# Patient Record
Sex: Female | Born: 1970 | Race: White | Hispanic: No | Marital: Single | Smoking: Former smoker
Health system: Southern US, Community
[De-identification: ages and names within clinical notes are randomized; demographics above are authoritative.]

## PROBLEM LIST (undated history)

## (undated) ENCOUNTER — Ambulatory Visit: Payer: Commercial Managed Care - HMO

## (undated) DIAGNOSIS — A64 Unspecified sexually transmitted disease: Secondary | ICD-10-CM

## (undated) DIAGNOSIS — F32A Depression, unspecified: Secondary | ICD-10-CM

## (undated) DIAGNOSIS — T7840XA Allergy, unspecified, initial encounter: Secondary | ICD-10-CM

## (undated) DIAGNOSIS — G43909 Migraine, unspecified, not intractable, without status migrainosus: Secondary | ICD-10-CM

## (undated) DIAGNOSIS — E7800 Pure hypercholesterolemia, unspecified: Secondary | ICD-10-CM

## (undated) DIAGNOSIS — M1990 Unspecified osteoarthritis, unspecified site: Secondary | ICD-10-CM

## (undated) DIAGNOSIS — C801 Malignant (primary) neoplasm, unspecified: Secondary | ICD-10-CM

## (undated) DIAGNOSIS — F419 Anxiety disorder, unspecified: Secondary | ICD-10-CM

## (undated) DIAGNOSIS — R87619 Unspecified abnormal cytological findings in specimens from cervix uteri: Secondary | ICD-10-CM

## (undated) DIAGNOSIS — I5021 Acute systolic (congestive) heart failure: Secondary | ICD-10-CM

## (undated) DIAGNOSIS — I219 Acute myocardial infarction, unspecified: Secondary | ICD-10-CM

## (undated) DIAGNOSIS — C4491 Basal cell carcinoma of skin, unspecified: Secondary | ICD-10-CM

## (undated) HISTORY — PX: CERVICAL SPINE SURGERY: SHX589

## (undated) HISTORY — DX: Malignant (primary) neoplasm, unspecified: C80.1

## (undated) HISTORY — DX: Allergy, unspecified, initial encounter: T78.40XA

## (undated) HISTORY — DX: Unspecified abnormal cytological findings in specimens from cervix uteri: R87.619

## (undated) HISTORY — DX: Unspecified osteoarthritis, unspecified site: M19.90

## (undated) HISTORY — DX: Depression, unspecified: F32.A

## (undated) HISTORY — DX: Migraine, unspecified, not intractable, without status migrainosus: G43.909

## (undated) HISTORY — DX: Anxiety disorder, unspecified: F41.9

## (undated) HISTORY — PX: SPINE SURGERY: SHX786

## (undated) HISTORY — PX: CERVIX LESION DESTRUCTION: SHX591

## (undated) HISTORY — DX: Basal cell carcinoma of skin, unspecified: C44.91

## (undated) HISTORY — DX: Unspecified sexually transmitted disease: A64

## (undated) NOTE — Unmapped External Note (Signed)
 Formatting of this note is different from the original. Anesthesia Transfer of Care Note  Patient: Wanda Wood Procedure(s) performed:  FUSION CERVICAL ANTERIOR MICRODISCECTOMY 2 LEVELS -  ANTERIOR CERVICAL MICRODISECTOMY & FUSION C5-C6, C6-C7 W/ INSTRUMENTATION; AUTOGRAFT Patient location: PACU Patient transported: on O2  Pain score: 3/10 Post assessment: no apparent anesthetic complications Last vitals:  Filed Vitals:   11/07/10 1410  BP: 143/68  Pulse: 94  Temp: 98.3 F  Resp: 20  SpO2: 100%   Post vital signs: stable Mental status: awake, alert  and oriented Complications: none  Comments:TO PACU WITH VSS AND NAD NOTED ON 4L [place].  REPORT AND CARE TO RN.  Electronically signed by Evalene GORMAN Blush at 11/07/2010  2:12 PM EDT

## (undated) NOTE — Anesthesia Postprocedure Evaluation (Signed)
 Formatting of this note is different from the original. Anesthesia Post Note  Patient: Wanda Wood  Procedure(s) Performed:  FUSION CERVICAL ANTERIOR MICRODISCECTOMY 2 LEVELS -  ANTERIOR CERVICAL MICRODISECTOMY & FUSION C5-C6, C6-C7 W/ INSTRUMENTATION; AUTOGRAFT  Mental status: awake, alert  and oriented  Cardiopulmonary status: stable  PostOp pain: 8/10  Anesthetic complications: none  Last Vitals:  Filed Vitals:   11/07/10 1411  BP: 143/68  Pulse: 94  Temp:   Resp: 20  SpO2: 100%   Comments:   Electronically signed by Warren DELENA Hoyer, MD at 11/07/2010  2:21 PM EDT

## (undated) NOTE — Anesthesia Preprocedure Evaluation (Signed)
 Formatting of this note might be different from the original. Anesthesia ROS/Med History  The patient does not have a history of anesthetic complications.   Neuro/Psych  - seizures - CVA + Pain, location: neck  + psychiatric history, type: anxiety  Neuro/Psych comments: Narcotic Dependent since 02/2010 for L sided neck pain w/ L arm radiation. C/o constant numbness L thumb Recently using 4 tabs  Vicodin daily Pulmonary  Patient has a negative pulmonary ROS  - smoking history  Cardiovascular  Patient has a negative cardiovascular ROS  Hx occasional murmur:  GI/Hepatic/Renal  Patient has a negative GI/hepatic/renal ROS   GERD: Denies. Hematology  Patient has a negative hematology ROS  Daily home Omega 3 use -LD 11/03/10:  Endo/Cancer/Other  Patient has a neg endo/other ROS   Other Review of Systems findings: Needs to remove contac lenses pre-op  Anesthesia Physical Exam Airway  Mallampati: II Pulmonary  Normal systems: pulmonary exam normal breath sounds clear to auscultation Cardiovascular  Normal systems: cardiovascular exam normal  rhythm: regular  rate: normal   Anesthesia Plan ASA Score: 2   Anesthesia Plan of Care:  GETA and Glide Plan Monitor  Monitor: routine Postoperative Plan  Post Operative Plan: routine  Informed consent  Anesthetic Plan and risks discussed with the patient.  The use of blood products was discussed with and consented by the patient.  Consent given Discussed the anesthetic plan with the CRNA and Anesthesiologist.    Electronically signed by Barnie DELENA Begun, RN at 11/07/2010 10:20 AM EDT Electronically signed by Barnie DELENA Begun, RN at 11/07/2010 10:30 AM EDT Electronically signed by Warren DELENA Hoyer, MD at 11/07/2010 10:40 AM EDT Electronically signed by Warren DELENA Hoyer, MD at 11/07/2010 10:41 AM EDT Electronically signed by Warren DELENA Hoyer, MD at 11/07/2010 11:07 AM EDT Electronically signed by Delon LOISE Lowers, CRNA at  11/07/2010 11:20 AM EDT Electronically signed by Evalene GORMAN Blush at 11/07/2010 11:43 AM EDT

---

## 2016-03-05 ENCOUNTER — Ambulatory Visit: Payer: Self-pay | Admitting: Allergy and Immunology

## 2016-03-23 ENCOUNTER — Ambulatory Visit: Payer: Self-pay | Admitting: Allergy and Immunology

## 2016-10-05 DIAGNOSIS — R87619 Unspecified abnormal cytological findings in specimens from cervix uteri: Secondary | ICD-10-CM | POA: Insufficient documentation

## 2016-10-05 LAB — HM PAP SMEAR: HM Pap smear: NEGATIVE

## 2016-11-05 DIAGNOSIS — N870 Mild cervical dysplasia: Secondary | ICD-10-CM | POA: Insufficient documentation

## 2016-11-05 DIAGNOSIS — N760 Acute vaginitis: Secondary | ICD-10-CM | POA: Insufficient documentation

## 2019-02-13 ENCOUNTER — Encounter: Payer: Self-pay | Admitting: Internal Medicine

## 2019-03-13 ENCOUNTER — Ambulatory Visit: Payer: Self-pay | Admitting: Internal Medicine

## 2020-08-06 ENCOUNTER — Encounter: Payer: Self-pay | Admitting: Adult Health

## 2020-09-10 ENCOUNTER — Ambulatory Visit: Payer: Self-pay | Admitting: Adult Health

## 2020-10-17 LAB — RESULTS CONSOLE HPV: CHL HPV: POSITIVE

## 2020-10-17 LAB — HM PAP SMEAR: HM Pap smear: NEGATIVE

## 2020-11-05 ENCOUNTER — Ambulatory Visit: Payer: Self-pay | Admitting: Adult Health

## 2021-01-18 ENCOUNTER — Other Ambulatory Visit: Payer: Self-pay

## 2021-01-18 ENCOUNTER — Encounter: Payer: Self-pay | Admitting: *Deleted

## 2021-01-18 ENCOUNTER — Ambulatory Visit
Admission: EM | Admit: 2021-01-18 | Discharge: 2021-01-18 | Disposition: A | Discharge status: Nursing Home (Includes ICF) | Payer: Self-pay

## 2021-01-18 ENCOUNTER — Inpatient Hospital Stay (HOSPITAL_COMMUNITY)
Admission: EM | Admit: 2021-01-18 | Discharge: 2021-01-22 | DRG: 246 | Disposition: A | Discharge status: Home / Self Care | Payer: Self-pay | Attending: Cardiovascular Disease | Admitting: Cardiovascular Disease

## 2021-01-18 ENCOUNTER — Encounter (HOSPITAL_COMMUNITY): Payer: Self-pay | Admitting: Emergency Medicine

## 2021-01-18 ENCOUNTER — Emergency Department (HOSPITAL_COMMUNITY): Payer: Self-pay

## 2021-01-18 DIAGNOSIS — Z79890 Hormone replacement therapy (postmenopausal): Secondary | ICD-10-CM

## 2021-01-18 DIAGNOSIS — Z8249 Family history of ischemic heart disease and other diseases of the circulatory system: Secondary | ICD-10-CM

## 2021-01-18 DIAGNOSIS — I5021 Acute systolic (congestive) heart failure: Secondary | ICD-10-CM | POA: Diagnosis present

## 2021-01-18 DIAGNOSIS — E7801 Familial hypercholesterolemia: Secondary | ICD-10-CM

## 2021-01-18 DIAGNOSIS — I959 Hypotension, unspecified: Secondary | ICD-10-CM

## 2021-01-18 DIAGNOSIS — I214 Non-ST elevation (NSTEMI) myocardial infarction: Secondary | ICD-10-CM

## 2021-01-18 DIAGNOSIS — I255 Ischemic cardiomyopathy: Secondary | ICD-10-CM

## 2021-01-18 DIAGNOSIS — N951 Menopausal and female climacteric states: Secondary | ICD-10-CM | POA: Diagnosis present

## 2021-01-18 DIAGNOSIS — Z20822 Contact with and (suspected) exposure to covid-19: Secondary | ICD-10-CM | POA: Diagnosis present

## 2021-01-18 DIAGNOSIS — R079 Chest pain, unspecified: Secondary | ICD-10-CM

## 2021-01-18 DIAGNOSIS — E739 Lactose intolerance, unspecified: Secondary | ICD-10-CM | POA: Diagnosis present

## 2021-01-18 DIAGNOSIS — Z882 Allergy status to sulfonamides status: Secondary | ICD-10-CM

## 2021-01-18 DIAGNOSIS — Z955 Presence of coronary angioplasty implant and graft: Secondary | ICD-10-CM

## 2021-01-18 DIAGNOSIS — I2510 Atherosclerotic heart disease of native coronary artery without angina pectoris: Secondary | ICD-10-CM

## 2021-01-18 DIAGNOSIS — E785 Hyperlipidemia, unspecified: Secondary | ICD-10-CM

## 2021-01-18 DIAGNOSIS — Z79899 Other long term (current) drug therapy: Secondary | ICD-10-CM

## 2021-01-18 HISTORY — DX: Pure hypercholesterolemia, unspecified: E78.00

## 2021-01-18 LAB — CBC WITH DIFFERENTIAL/PLATELET
Abs Immature Granulocytes: 0.01 10*3/uL (ref 0.00–0.07)
Basophils Absolute: 0 10*3/uL (ref 0.0–0.1)
Basophils Relative: 1 %
Eosinophils Absolute: 0.1 10*3/uL (ref 0.0–0.5)
Eosinophils Relative: 1 %
HCT: 41.8 % (ref 36.0–46.0)
Hemoglobin: 13.7 g/dL (ref 12.0–15.0)
Immature Granulocytes: 0 %
Lymphocytes Relative: 30 %
Lymphs Abs: 2.2 10*3/uL (ref 0.7–4.0)
MCH: 29.6 pg (ref 26.0–34.0)
MCHC: 32.8 g/dL (ref 30.0–36.0)
MCV: 90.3 fL (ref 80.0–100.0)
Monocytes Absolute: 0.6 10*3/uL (ref 0.1–1.0)
Monocytes Relative: 8 %
Neutro Abs: 4.5 10*3/uL (ref 1.7–7.7)
Neutrophils Relative %: 60 %
Platelets: 266 10*3/uL (ref 150–400)
RBC: 4.63 MIL/uL (ref 3.87–5.11)
RDW: 12.5 % (ref 11.5–15.5)
WBC: 7.3 10*3/uL (ref 4.0–10.5)
nRBC: 0 % (ref 0.0–0.2)

## 2021-01-18 LAB — BASIC METABOLIC PANEL
Anion gap: 8 (ref 5–15)
BUN: 7 mg/dL (ref 6–20)
CO2: 24 mmol/L (ref 22–32)
Calcium: 9.2 mg/dL (ref 8.9–10.3)
Chloride: 106 mmol/L (ref 98–111)
Creatinine, Ser: 0.76 mg/dL (ref 0.44–1.00)
GFR, Estimated: >60 mL/min (ref >60)
Glucose, Bld: 116 mg/dL — ABNORMAL HIGH (ref 70–99)
Potassium: 4.2 mmol/L (ref 3.5–5.1)
Sodium: 138 mmol/L (ref 135–145)

## 2021-01-18 LAB — HEPATIC FUNCTION PANEL
ALT: 12 U/L (ref 0–44)
AST: 20 U/L (ref 15–41)
Albumin: 3.8 g/dL (ref 3.5–5.0)
Alkaline Phosphatase: 69 U/L (ref 38–126)
Bilirubin, Direct: <0.1 mg/dL (ref 0.0–0.2)
Total Bilirubin: 0.8 mg/dL (ref 0.3–1.2)
Total Protein: 6.9 g/dL (ref 6.5–8.1)

## 2021-01-18 LAB — D-DIMER, QUANTITATIVE: D-Dimer, Quant: 0.48 ug/mL-FEU (ref 0.00–0.50)

## 2021-01-18 LAB — TROPONIN I (HIGH SENSITIVITY)
Troponin I (High Sensitivity): 485 ng/L (ref <18)
Troponin I (High Sensitivity): 1936 ng/L (ref <18)
Troponin I (High Sensitivity): 1749 ng/L (ref <18)

## 2021-01-18 LAB — BRAIN NATRIURETIC PEPTIDE: B Natriuretic Peptide: 227.3 pg/mL — ABNORMAL HIGH (ref 0.0–100.0)

## 2021-01-18 LAB — MAGNESIUM: Magnesium: 2.1 mg/dL (ref 1.7–2.4)

## 2021-01-18 LAB — HEMOGLOBIN A1C
Hgb A1c MFr Bld: 5.7 % — ABNORMAL HIGH (ref 4.8–5.6)
Mean Plasma Glucose: 116.89 mg/dL

## 2021-01-18 LAB — TSH: TSH: 2.255 u[IU]/mL (ref 0.350–4.500)

## 2021-01-18 MED ORDER — HEPARIN (PORCINE) 25000 UT/250ML-% IV SOLN
1050.0000 [IU]/h | INTRAVENOUS | Status: DC
  Administered 2021-01-18: 900 [IU]/h via INTRAVENOUS
  Administered 2021-01-19: 1050 [IU]/h via INTRAVENOUS
  Filled 2021-01-18 (×2): qty 250

## 2021-01-18 MED ORDER — NITROGLYCERIN 0.4 MG SL SUBL: 0.4000 mg | SUBLINGUAL_TABLET | Freq: Once | SUBLINGUAL | Status: DC

## 2021-01-18 MED ORDER — HEPARIN BOLUS VIA INFUSION
4000.0000 [IU] | Freq: Once | INTRAVENOUS | Status: AC
  Administered 2021-01-18: 4000 [IU] via INTRAVENOUS
  Filled 2021-01-18: qty 4000

## 2021-01-18 MED ORDER — METOPROLOL TARTRATE 25 MG PO TABS
25.0000 mg | ORAL_TABLET | Freq: Two times a day (BID) | ORAL | Status: DC
  Administered 2021-01-19 – 2021-01-20 (×3): 25 mg via ORAL
  Filled 2021-01-18 (×4): qty 1

## 2021-01-18 MED ORDER — ASPIRIN 325 MG PO TABS
325.0000 mg | ORAL_TABLET | Freq: Every day | ORAL | Status: DC
  Administered 2021-01-18: 325 mg via ORAL
  Filled 2021-01-18: qty 1

## 2021-01-18 MED ORDER — ROSUVASTATIN CALCIUM 20 MG PO TABS
40.0000 mg | ORAL_TABLET | Freq: Every day | ORAL | Status: DC
  Administered 2021-01-18 – 2021-01-20 (×3): 40 mg via ORAL
  Filled 2021-01-18 (×3): qty 2

## 2021-01-18 MED ORDER — ASPIRIN EC 81 MG PO TBEC
81.0000 mg | DELAYED_RELEASE_TABLET | Freq: Every day | ORAL | Status: DC
  Administered 2021-01-19: 81 mg via ORAL
  Filled 2021-01-18: qty 1

## 2021-01-18 MED ORDER — ACETAMINOPHEN 325 MG PO TABS
650.0000 mg | ORAL_TABLET | ORAL | Status: DC | PRN
  Administered 2021-01-19: 13:00:00 650 mg via ORAL
  Filled 2021-01-18: qty 2

## 2021-01-18 MED ORDER — ONDANSETRON HCL 4 MG/2ML IJ SOLN: 4.0000 mg | Freq: Four times a day (QID) | INTRAMUSCULAR | Status: DC | PRN

## 2021-01-18 MED ORDER — NITROGLYCERIN 0.4 MG SL SUBL: 0.4000 mg | SUBLINGUAL_TABLET | SUBLINGUAL | Status: DC | PRN

## 2021-01-18 NOTE — Progress Notes (Signed)
ANTICOAGULATION CONSULT NOTE - Initial Consult  Pharmacy Consult for heparin Indication: chest pain/ACS  Allergies  Allergen Reactions   Lactose Intolerance (Gi) Other (See Comments)    GI upset, SOB   Soy Allergy Other (See Comments)    GI upset   Sulfa Antibiotics Nausea And Vomiting    Patient Measurements:   Heparin Dosing Weight: 70 kg  Vital Signs: Temp: 98.4 F (36.9 C) (12/10 1347) Temp Source: Oral (12/10 1347) BP: 137/88 (12/10 1630) Pulse Rate: 77 (12/10 1630)  Labs: Recent Labs    01/18/21 1349  HGB 13.7  HCT 41.8  PLT 266  CREATININE 0.76  TROPONINIHS 485*    CrCl cannot be calculated (Unknown ideal weight.).   Medical History: Past Medical History:  Diagnosis Date   High cholesterol     Medications:  (Not in a hospital admission)   Assessment: 43 YOF with chest pain concerning for ACS. Pharmacy consulted to start IV heparin.   H/H and Plt wnl. SCr wnl   Goal of Therapy:  Heparin level 0.3-0.7 units/ml Monitor platelets by anticoagulation protocol: Yes   Plan:  -Heparin 4000 units IV bolus followed by heparin infusion 900 units/hr  -F/u 6 hr HL -Monitor daily HL, CBC and s/s of bleeding   Vinnie Level, PharmD., BCPS, BCCCP Clinical Pharmacist Please refer to [facility] for unit-specific pharmacist

## 2021-01-18 NOTE — H&P (Signed)
Cardiology Admission History and Physical:   Patient ID: Wanda Wood MRN: [id number]; DOB: 1970/06/29   Admission date: 01/18/2021  PCP:  Pcp, No   [facility] Providers Cardiologist:  None        Chief Complaint:  chest pain  Patient Profile:   Wanda Wood is a 50 y.o. 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  History of Present Illness:   Ms. Lung reports intermittent chest tightness over the past 6 months with significant emotional stress which self-resolves. Today, she developed resting chest pain not related to emotion with associated dyspnea and feeling cool and clammy with radiation to the left shoulder. She has chronic neck/shoulder pain but this pain is grabbing, dull, not not radiating from the upper neck like the previous pain was. Currently chest pain resolved but still with shoulder pain Last lipid panel obtained was last year, she reports had total cholesterol over 500, LDL over 400. The last lipid panel in care everywhere here is from 2014; she had tcholesterol 502 but LDLc of 24. Previously on lipitor but poorly tolerated due to gi upset so discontinued, not on any lipid suppressive agents Here normal vitals. Labs notable for troponins 400s -> 1900s. Ddimer negative. Normal hgb. Givena spirin and started on heparin gtt. Admitted to cardiology   Past Medical History:  Diagnosis Date   High cholesterol     Past Surgical History:  Procedure Laterality Date   CERVICAL SPINE SURGERY       Medications Prior to Admission: Prior to Admission medications   Medication Sig Start Date End Date Taking? Authorizing Provider  acetaminophen (TYLENOL) 500 MG tablet Take 500 mg by mouth every 6 (six) hours as needed for moderate pain or mild pain.   Yes [provider]  estradiol (CLIMARA - DOSED IN MG/24 HR) 0.05 mg/24hr  patch Place 0.05 mg onto the skin 2 (two) times a week.   Yes [provider]  progesterone (PROMETRIUM) 100 MG capsule Take 100 mg by mouth at bedtime. 07/31/20  Yes [provider]     Allergies:    Allergies  Allergen Reactions   Lactose Intolerance (Gi) Other (See Comments)    GI upset, SOB   Soy Allergy Other (See Comments)    GI upset   Sulfa Antibiotics Nausea And Vomiting    Social History:   Social History   Socioeconomic History   Marital status: Single    Spouse name: Not on file   Number of children: Not on file   Years of education: Not on file   Highest education level: Not on file  Occupational History   Not on file  Tobacco Use   Smoking status: Never   Smokeless tobacco: Never  Substance and Sexual Activity   Alcohol use: Yes   Drug use: Not Currently   Sexual activity: Not on file  Other Topics Concern   Not on file  Social History Narrative   Not on file   Social Determinants of Health   Financial Resource Strain: Not on file  Food Insecurity: Not on file  Transportation Needs: Not on file  Physical Activity: Not on file  Stress: Not on file  Social Connections: Not on file  Intimate Partner Violence: Not on file    Family History:   The patient's family history is not on file.    ROS:  Please see the history of  present illness.  All other ROS reviewed and negative.     Physical Exam/Data:   Vitals:   01/18/21 1700 01/18/21 1730 01/18/21 1800 01/18/21 1830  BP: (!) 122/92 130/89 (!) 132/100 128/86  Pulse: 66 68 72 66  Resp: 16 16 16    Temp:      TempSrc:      SpO2: 100% 99% 100% 100%  Weight:       No intake or output data in the 24 hours ending 01/18/21 1839 Last 3 Weights 01/18/2021  Weight (lbs) 155 lb  Weight (kg) 70.308 kg     There is no height or weight on file to calculate BMI.  General:  Well nourished, well developed, in no acute distress HEENT: normal Neck: no JVD Vascular: No carotid bruits;  Distal pulses 2+ bilaterally   Cardiac:  normal S1, S2; RRR; no murmur  Lungs:  clear to auscultation bilaterally, no wheezing, rhonchi or rales  Abd: soft, nontender, no hepatomegaly  Ext: no edema Musculoskeletal:  No deformities, BUE and BLE strength normal and equal Skin: warm and dry  Neuro:  CNs 2-12 intact, no focal abnormalities noted Psych:  Normal affect    EKG:  The ECG that was done  was personally reviewed and demonstrates NSR no ST-T changes   Laboratory Data:  High Sensitivity Troponin:   Recent Labs  Lab 01/18/21 1349 01/18/21 1549  TROPONINIHS 485* 1,936*      Chemistry Recent Labs  Lab 01/18/21 1349  NA 138  K 4.2  CL 106  CO2 24  GLUCOSE 116*  BUN 7  CREATININE 0.76  CALCIUM 9.2  GFRNONAA >60  ANIONGAP 8    No results for input(s): PROT, ALBUMIN, AST, ALT, ALKPHOS, BILITOT in the last 168 hours. Lipids No results for input(s): CHOL, TRIG, HDL, LABVLDL, LDLCALC, CHOLHDL in the last 168 hours. Hematology Recent Labs  Lab 01/18/21 1349  WBC 7.3  RBC 4.63  HGB 13.7  HCT 41.8  MCV 90.3  MCH 29.6  MCHC 32.8  RDW 12.5  PLT 266   Thyroid No results for input(s): TSH, FREET4 in the last 168 hours. BNPNo results for input(s): BNP, PROBNP in the last 168 hours.  DDimer  Recent Labs  Lab 01/18/21 1625  DDIMER 0.48     Radiology/Studies:  DG Chest 2 View  Result Date: 01/18/2021 CLINICAL DATA:  Chest pain EXAM: CHEST - 2 VIEW COMPARISON:  None. FINDINGS: Heart size and mediastinal contours are within normal limits. No suspicious pulmonary opacities identified. No pleural effusion or pneumothorax visualized. No acute osseous abnormality appreciated. IMPRESSION: No acute intrathoracic process identified. Electronically Signed   By: 14/11/2020 M.D.   On: 01/18/2021 14:33     Assessment and Plan:  [name] is a 33 YOF hx untreated severe hyperlipidemia and chronic estrogen use presents with T1 NSTEMI. Favor thrombotic due to  untreated hyperlipidemia. Plan for [facility] 01/20/2021  T1 NSTEMI: TIMI 1, Grace 78. Heparin gtt. Aspirin. Crestor 40 daily since did not tolerate lipitor previously (patient agreeable to retrial statins). Metoprolol tartrate 25 BID. SL nitroglycerin, if still in pain following this plan for nitroglycerin gtt. Serial Ekgs. Trend troponins to peak. Echocardiogram. Telemetry monitoring. LHC Monday  Hyperlipidemia, possbiyl familial: Recheck lipid panel. Goal LDL < 70. Depending on results may need genetic screening, work-up for relatives. Would consider zetia/bempedoic acid/PCSK9i pending these results Post-menopausal symptoms: Hold topical estrogen right now given risk of embolic/thrombotic events. Re-evaluate appropriateness of estrogen before discharge   Risk  Assessment/Risk Scores:    TIMI Risk Score for Unstable Angina or Non-ST Elevation MI:   The patient's TIMI risk score is  , which indicates a  % risk of all cause mortality, new or recurrent myocardial infarction or need for urgent revascularization in the next 14 days.       Severity of Illness: The appropriate patient status for this patient is INPATIENT. Inpatient status is judged to be reasonable and necessary in order to provide the required intensity of service to ensure the patient's safety. The patient's presenting symptoms, physical exam findings, and initial radiographic and laboratory data in the context of their chronic comorbidities is felt to place them at high risk for further clinical deterioration. Furthermore, it is not anticipated that the patient will be medically stable for discharge from the hospital within 2 midnights of admission.   * I certify that at the point of admission it is my clinical judgment that the patient will require inpatient hospital care spanning beyond 2 midnights from the point of admission due to high intensity of service, high risk for further deterioration and high frequency of surveillance required.*    For questions or updates, please contact [facility] Please consult [email or web address] for contact info under     Signed, [place] Mija Effertz, MD  01/18/2021 6:39 PM

## 2021-01-18 NOTE — ED Provider Notes (Signed)
Emergency Medicine Provider Triage Evaluation Note  Wanda Wood , a 50 y.o. female  was evaluated in triage.  Pt complains of chest pain and jaw pain since yesterday.  She states that she has had "burning", intermittent chest pain that radiates to the jaw.  She endorses shortness of breath and lightheadedness with it.  She states that she felt like she was having an anxiety attack possibly.  She denies any nausea, diaphoresis with the episode.  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  She has a history of hyperlipidemia and not medicated for this.  She denies any cough or fevers.  Review of Systems  Positive: See above Negative:   Physical Exam  BP 116/72 (BP Location: Right Arm)   Pulse 77   Temp 98.4 F (36.9 C) (Oral)   Resp 18   SpO2 100%  Gen:   Awake, no distress   Resp:  Normal effort, clear to auscultation bilaterally MSK:   Moves extremities without difficulty  Other:  S1/S2 without murmur, pulses bilateral 2+  Medical Decision Making  Medically screening exam initiated at 1:48 PM.  Appropriate orders placed.  Wanda Wood was informed that the remainder of the evaluation will be completed by another provider, this initial triage assessment does not replace that evaluation, and the importance of remaining in the ED until their evaluation is complete.     Cristopher Peru, PA-C 01/18/21 1349    Cheryll Cockayne, MD 01/20/21 [phone]

## 2021-01-18 NOTE — ED Triage Notes (Signed)
Pt reports intermittent chest pain that radiates to jaws and arms x 3 days with SOB.  States she felt like she was having an anxiety attack.  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

## 2021-01-18 NOTE — ED Notes (Signed)
Trop 485.  Lauren, PA and charge RN notified.

## 2021-01-18 NOTE — ED Triage Notes (Signed)
Pt reports 2 short "episodes" of chest pain, SOB lasting approx couple min each time. Denies any chest pain or SOB at this moment, but c/o back pain "right through my shoulder blades".

## 2021-01-18 NOTE — ED Notes (Signed)
Pt refused to be stuck again at this time.

## 2021-01-18 NOTE — ED Provider Notes (Signed)
Here today for evaluation of chest pain, discomfort that started a few days ago.  She states that symptoms seem to worsen this morning and she had more tightness and states that pressure seem to radiate into her shoulder as well as her jaw.  She is not currently experiencing any chest pain but states blood pressure does seem to be more elevated than what it typically is at home.  EKG without concerning findings but did recommend further evaluation in the emergency room for troponin levels.  Patient's mother is here and will transfer her to [facility] immediately after leaving this office.   Tomi Bamberger, PA-C 01/18/21 1247

## 2021-01-18 NOTE — ED Provider Notes (Signed)
[facility] EMERGENCY DEPARTMENT Provider Note   CSN: [id number] Arrival date & time: 01/18/21  1320     History Chief Complaint  Patient presents with   Chest Pain    Wanda Wood is a 50 y.o. female.  The history is provided by the patient and medical records.  Chest Pain Pain location:  Substernal area Pain quality: burning   Pain radiates to:  L jaw and R jaw Pain severity:  Moderate Onset quality:  Gradual Duration:  2 hours Timing:  Constant Progression:  Resolved Chronicity:  New Context: at rest   Relieved by:  Rest Worsened by:  Nothing Ineffective treatments:  None tried Associated symptoms: shortness of breath   Associated symptoms: no abdominal pain, no back pain, no cough, no fever, no palpitations and no vomiting    HPI: A 50 year old patient presents for evaluation of chest pain. Initial onset of pain was approximately 3-6 hours ago. The patient's chest pain is described as heaviness/pressure/tightness and is not worse with exertion. The patient's chest pain is middle- or left-sided, is not well-localized, is not sharp and does radiate to the arms/jaw/neck. The patient does not complain of nausea and denies diaphoresis. The patient has no history of stroke, has no history of peripheral artery disease, has not smoked in the past 90 days, denies any history of treated diabetes, has no relevant family history of coronary artery disease (first degree relative at less than age 45), is not hypertensive, has no history of hypercholesterolemia and does not have an elevated BMI (>=30).   Past Medical History:  Diagnosis Date   High cholesterol     Patient Active Problem List   Diagnosis Date Noted   NSTEMI (non-ST elevated myocardial infarction) ([place]) 01/18/2021    Past Surgical History:  Procedure Laterality Date   CERVICAL SPINE SURGERY       OB History   No obstetric history on file.     No family history on file.  Social  History   Tobacco Use   Smoking status: Never   Smokeless tobacco: Never  Substance Use Topics   Alcohol use: Yes   Drug use: Not Currently    Home Medications Prior to Admission medications   Medication Sig Start Date End Date Taking? Authorizing Provider  acetaminophen (TYLENOL) 500 MG tablet Take 500 mg by mouth every 6 (six) hours as needed for moderate pain or mild pain.   Yes [provider]  estradiol (CLIMARA - DOSED IN MG/24 HR) 0.05 mg/24hr patch Place 0.05 mg onto the skin 2 (two) times a week.   Yes [provider]  progesterone (PROMETRIUM) 100 MG capsule Take 100 mg by mouth at bedtime. 07/31/20  Yes [provider]    Allergies    Lactose intolerance (gi), Soy allergy, and Sulfa antibiotics  Review of Systems   Review of Systems  Constitutional:  Negative for chills and fever.  HENT:  Negative for ear pain and sore throat.   Eyes:  Negative for pain and visual disturbance.  Respiratory:  Positive for shortness of breath. Negative for cough.   Cardiovascular:  Positive for chest pain. Negative for palpitations.  Gastrointestinal:  Negative for abdominal pain and vomiting.  Genitourinary:  Negative for dysuria and hematuria.  Musculoskeletal:  Negative for arthralgias and back pain.  Skin:  Negative for color change and rash.  Neurological:  Negative for seizures and syncope.  All other systems reviewed and are negative.  Physical Exam Updated Vital Signs  BP 128/86   Pulse 66   Temp 98.4 F (36.9 C) (Oral)   Resp 16   Wt 70.3 kg Comment: Patient reported  SpO2 100%   Physical Exam Vitals and nursing note reviewed.  Constitutional:      General: She is not in acute distress.    Appearance: Normal appearance. She is well-developed.  HENT:     Head: Normocephalic and atraumatic.     Right Ear: External ear normal.     Left Ear: External ear normal.     Nose: Nose normal. No congestion or rhinorrhea.     Mouth/Throat:      Mouth: Mucous membranes are moist.  Eyes:     Extraocular Movements: Extraocular movements intact.     Conjunctiva/sclera: Conjunctivae normal.     Pupils: Pupils are equal, round, and reactive to light.  Cardiovascular:     Rate and Rhythm: Normal rate and regular rhythm.     Pulses: Normal pulses.          Radial pulses are 2+ on the right side and 2+ on the left side.     Heart sounds: No murmur heard. Pulmonary:     Effort: Pulmonary effort is normal. No respiratory distress.     Breath sounds: Normal breath sounds. No wheezing, rhonchi or rales.  Chest:     Chest wall: No tenderness.  Abdominal:     General: Abdomen is flat. Bowel sounds are normal.     Palpations: Abdomen is soft.     Tenderness: There is no abdominal tenderness. There is no guarding or rebound.  Musculoskeletal:        General: No swelling, tenderness or deformity.     Cervical back: Normal range of motion and neck supple. No rigidity.  Skin:    General: Skin is warm and dry.     Capillary Refill: Capillary refill takes less than 2 seconds.  Neurological:     General: No focal deficit present.     Mental Status: She is alert and oriented to person, place, and time.  Psychiatric:        Mood and Affect: Mood normal.    ED Results / Procedures / Treatments   Labs (all labs ordered are listed, but only abnormal results are displayed) Labs Reviewed  BASIC METABOLIC PANEL - Abnormal; Notable for the following components:      Result Value   Glucose, Bld 116 (*)    All other components within normal limits  TROPONIN I (HIGH SENSITIVITY) - Abnormal; Notable for the following components:   Troponin I (High Sensitivity) 485 (*)    All other components within normal limits  TROPONIN I (HIGH SENSITIVITY) - Abnormal; Notable for the following components:   Troponin I (High Sensitivity) 1,936 (*)    All other components within normal limits  CBC WITH DIFFERENTIAL/PLATELET  D-DIMER, QUANTITATIVE (NOT AT [facility])   TSH  HEPARIN LEVEL (UNFRACTIONATED)  CBC  HEMOGLOBIN A1C  MAGNESIUM  HEPATIC FUNCTION PANEL  BRAIN NATRIURETIC PEPTIDE  LIPID PANEL  BASIC METABOLIC PANEL    EKG EKG Interpretation  Date/Time:  Saturday January 18 2021 13:45:15 EST Ventricular Rate:  64 PR Interval:  140 QRS Duration: 88 QT Interval:  400 QTC Calculation: 412 R Axis:   83 Text Interpretation: Normal sinus rhythm Nonspecific ST abnormality Abnormal ECG Confirmed by Madalyn Rob [phone]) on 01/18/2021 4:46:52 PM  Radiology DG Chest 2 View  Result Date: 01/18/2021 CLINICAL DATA:  Chest pain EXAM: CHEST - 2  VIEW COMPARISON:  None. FINDINGS: Heart size and mediastinal contours are within normal limits. No suspicious pulmonary opacities identified. No pleural effusion or pneumothorax visualized. No acute osseous abnormality appreciated. IMPRESSION: No acute intrathoracic process identified. Electronically Signed   By: Jannifer Hick M.D.   On: 01/18/2021 14:33    Procedures Procedures   Medications Ordered in ED Medications  heparin ADULT infusion 100 units/mL (25000 units/263mL) (900 Units/hr Intravenous New Bag/Given 01/18/21 1724)  nitroGLYCERIN (NITROSTAT) SL tablet 0.4 mg (0.4 mg Sublingual Patient Refused/Not Given 01/18/21 1731)  aspirin EC tablet 81 mg (has no administration in time range)  nitroGLYCERIN (NITROSTAT) SL tablet 0.4 mg (has no administration in time range)  acetaminophen (TYLENOL) tablet 650 mg (has no administration in time range)  ondansetron (ZOFRAN) injection 4 mg (has no administration in time range)  metoprolol tartrate (LOPRESSOR) tablet 25 mg (has no administration in time range)  rosuvastatin (CRESTOR) tablet 40 mg (has no administration in time range)  heparin bolus via infusion 4,000 Units (4,000 Units Intravenous Bolus from Bag 01/18/21 1731)    ED Course  I have reviewed the triage vital signs and the nursing notes.  Pertinent labs & imaging results that were  available during my care of the patient were reviewed by me and considered in my medical decision making (see chart for details).    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  Initial EKG showed sinus rhythm with nonspecific ST changes in the anterior lateral leads.  Initial troponin elevated to 485.  Concerns for NSTEMI.  She was given aspirin and started on heparin GTT.  Delta troponin pending.  Chest x-ray showed no signs of pneumonia, pneumothorax, pneumomediastinum.  She has no pleuritic chest pain.  She is not tachycardic or tachypneic.  She is not requiring oxygen.  However she is not PERC negative due to age and hormone use.  D-dimer pending.   She has no tearing pain to back.  She is not significantly hypertensive.  No pulse deficits on exam.  Low concern for aortic dissection.  Cardiology consulted.  Will come evaluate patient. Patient reassessed.  Resting comfortably.  Does report persistent right shoulder pain.  May be a atypical presentation of chest pain.  She was given sublingual nitro.  Delta troponin 1936 concerning for ACS.  Discussed with cardiology.  Agree with above evaluation and management.  Will admit to the service for further monitoring and cardiac evaluation.     Final Clinical Impression(s) / ED Diagnoses Final diagnoses:  NSTEMI (non-ST elevated myocardial infarction) [facility])    Rx / DC Orders ED Discharge Orders     None        Lutricia Feil, MD 01/18/21 1839    Milagros Loll, MD 01/18/21 2236

## 2021-01-19 ENCOUNTER — Other Ambulatory Visit (HOSPITAL_COMMUNITY): Payer: Self-pay

## 2021-01-19 DIAGNOSIS — I214 Non-ST elevation (NSTEMI) myocardial infarction: Principal | ICD-10-CM

## 2021-01-19 DIAGNOSIS — E785 Hyperlipidemia, unspecified: Secondary | ICD-10-CM

## 2021-01-19 LAB — LIPID PANEL
Cholesterol: 511 mg/dL — ABNORMAL HIGH (ref 0–200)
HDL: 69 mg/dL (ref >40)
LDL Cholesterol: 420 mg/dL — ABNORMAL HIGH (ref 0–99)
Total CHOL/HDL Ratio: 7.4 RATIO
Triglycerides: 110 mg/dL (ref <150)
VLDL: 22 mg/dL (ref 0–40)

## 2021-01-19 LAB — BASIC METABOLIC PANEL
Anion gap: 9 (ref 5–15)
BUN: 6 mg/dL (ref 6–20)
CO2: 24 mmol/L (ref 22–32)
Calcium: 8.9 mg/dL (ref 8.9–10.3)
Chloride: 105 mmol/L (ref 98–111)
Creatinine, Ser: 0.72 mg/dL (ref 0.44–1.00)
GFR, Estimated: >60 mL/min (ref >60)
Glucose, Bld: 98 mg/dL (ref 70–99)
Potassium: 3.8 mmol/L (ref 3.5–5.1)
Sodium: 138 mmol/L (ref 135–145)

## 2021-01-19 LAB — RESP PANEL BY RT-PCR (FLU A&B, COVID) ARPGX2
Influenza A by PCR: NEGATIVE
Influenza B by PCR: NEGATIVE
SARS Coronavirus 2 by RT PCR: NEGATIVE

## 2021-01-19 LAB — CBC
HCT: 41.2 % (ref 36.0–46.0)
Hemoglobin: 13.7 g/dL (ref 12.0–15.0)
MCH: 29.3 pg (ref 26.0–34.0)
MCHC: 33.3 g/dL (ref 30.0–36.0)
MCV: 88.2 fL (ref 80.0–100.0)
Platelets: 270 10*3/uL (ref 150–400)
RBC: 4.67 MIL/uL (ref 3.87–5.11)
RDW: 12.6 % (ref 11.5–15.5)
WBC: 10.5 10*3/uL (ref 4.0–10.5)
nRBC: 0 % (ref 0.0–0.2)

## 2021-01-19 LAB — TROPONIN I (HIGH SENSITIVITY): Troponin I (High Sensitivity): 1006 ng/L (ref <18)

## 2021-01-19 LAB — HEPARIN LEVEL (UNFRACTIONATED)
Heparin Unfractionated: 0.47 IU/mL (ref 0.30–0.70)
Heparin Unfractionated: 0.27 IU/mL — ABNORMAL LOW (ref 0.30–0.70)
Heparin Unfractionated: 0.36 IU/mL (ref 0.30–0.70)

## 2021-01-19 MED ORDER — SODIUM CHLORIDE 0.9 % IV SOLN: 250.0000 mL | INTRAVENOUS | Status: DC | PRN

## 2021-01-19 MED ORDER — SODIUM CHLORIDE 0.9 % WEIGHT BASED INFUSION
3.0000 mL/kg/h | INTRAVENOUS | Status: DC
  Administered 2021-01-20: 3 mL/kg/h via INTRAVENOUS

## 2021-01-19 MED ORDER — ASPIRIN 81 MG PO CHEW
81.0000 mg | CHEWABLE_TABLET | ORAL | Status: AC
  Administered 2021-01-20: 81 mg via ORAL
  Filled 2021-01-19: qty 1

## 2021-01-19 MED ORDER — SODIUM CHLORIDE 0.9% FLUSH
3.0000 mL | Freq: Two times a day (BID) | INTRAVENOUS | Status: DC
  Administered 2021-01-20 – 2021-01-22 (×4): 3 mL via INTRAVENOUS

## 2021-01-19 MED ORDER — EZETIMIBE 10 MG PO TABS
10.0000 mg | ORAL_TABLET | Freq: Every day | ORAL | Status: DC
  Administered 2021-01-19 – 2021-01-22 (×4): 10 mg via ORAL
  Filled 2021-01-19 (×4): qty 1

## 2021-01-19 MED ORDER — SODIUM CHLORIDE 0.9 % WEIGHT BASED INFUSION
1.0000 mL/kg/h | INTRAVENOUS | Status: DC
  Administered 2021-01-20: 1 mL/kg/h via INTRAVENOUS

## 2021-01-19 MED ORDER — LORAZEPAM 0.5 MG PO TABS
0.5000 mg | ORAL_TABLET | Freq: Once | ORAL | Status: AC
  Administered 2021-01-19: 0.5 mg via ORAL
  Filled 2021-01-19: qty 1

## 2021-01-19 MED ORDER — SODIUM CHLORIDE 0.9% FLUSH: 3.0000 mL | INTRAVENOUS | Status: DC | PRN

## 2021-01-19 NOTE — Progress Notes (Signed)
ANTICOAGULATION CONSULT NOTE   Pharmacy Consult for heparin Indication: chest pain/ACS  Allergies  Allergen Reactions   Lactose Intolerance (Gi) Other (See Comments)    GI upset, SOB   Soy Allergy Other (See Comments)    GI upset   Sulfa Antibiotics Nausea And Vomiting    Patient Measurements: Height: 5\' 4"  (162.6 cm) Weight: 71 kg (156 lb 8 oz) IBW/kg (Calculated) : 54.7 Heparin Dosing Weight: 70 kg  Vital Signs: Temp: 97.9 F (36.6 C) (12/10 2313) Temp Source: Oral (12/10 2313) BP: 121/83 (12/10 2313) Pulse Rate: 77 (12/10 2313)  Labs: Recent Labs    01/18/21 1349 01/18/21 1549 01/18/21 2141 01/19/21 0020  HGB 13.7  --   --  13.7  HCT 41.8  --   --  41.2  PLT 266  --   --  270  HEPARINUNFRC  --   --   --  0.47  CREATININE 0.76  --   --   --   TROPONINIHS 485* 1,936* 1,749*  --      Estimated Creatinine Clearance: 79.5 mL/min (by C-G formula based on SCr of 0.76 mg/dL).   Medical History: Past Medical History:  Diagnosis Date   High cholesterol     Medications:  Medications Prior to Admission  Medication Sig Dispense Refill Last Dose   acetaminophen (TYLENOL) 500 MG tablet Take 500 mg by mouth every 6 (six) hours as needed for moderate pain or mild pain.   01/18/2021   estradiol (CLIMARA - DOSED IN MG/24 HR) 0.05 mg/24hr patch Place 0.05 mg onto the skin 2 (two) times a week.   01/17/2021   progesterone (PROMETRIUM) 100 MG capsule Take 100 mg by mouth at bedtime.   Past Week    Assessment: 8 YOF with chest pain concerning for ACS. Pharmacy consulted to dose IV heparin.  -heparin level at goal on 900 units/hr   Goal of Therapy:  Heparin level 0.3-0.7 units/ml Monitor platelets by anticoagulation protocol: Yes   Plan:  -Continue heparin at 900 units/hr -Daily heparin level and CBC  44, PharmD Clinical Pharmacist **Pharmacist phone directory can now be found on [email or web address] ([email or web address]).  Listed under [facility].

## 2021-01-19 NOTE — Progress Notes (Signed)
ANTICOAGULATION CONSULT NOTE - Follow Up Consult  Pharmacy Consult for heparin Indication: chest pain/ACS  Allergies  Allergen Reactions   Lactose Intolerance (Gi) Other (See Comments)    GI upset, SOB   Soy Allergy Other (See Comments)    GI upset   Sulfa Antibiotics Nausea And Vomiting    Patient Measurements: Height: 5\' 4"  (162.6 cm) Weight: 69.2 kg (152 lb 8 oz) IBW/kg (Calculated) : 54.7 Heparin Dosing Weight: 69.2 kg  Vital Signs: Temp: 98.9 F (37.2 C) (12/11 0735) Temp Source: Oral (12/11 0735) BP: 110/79 (12/11 0735) Pulse Rate: 78 (12/11 0735)  Labs: Recent Labs    01/18/21 1349 01/18/21 1549 01/18/21 2141 01/19/21 0020 01/19/21 0259 01/19/21 0716  HGB 13.7  --   --  13.7  --   --   HCT 41.8  --   --  41.2  --   --   PLT 266  --   --  270  --   --   HEPARINUNFRC  --   --   --  0.47  --  0.27*  CREATININE 0.76  --   --  0.72  --   --   TROPONINIHS 485* 1,936* 1,749*  --  1,006*  --     Estimated Creatinine Clearance: 78.6 mL/min (by C-G formula based on SCr of 0.72 mg/dL).   Assessment: 19 YOF with chest pain concerning for ACS. Pharmacy consulted to dose IV heparin.   Most recent heparin level subtherapeutic at 0.27 on heparin 900 units/hr. No signs of bleeding or infusion issues noted per RN.   Goal of Therapy:  Heparin level 0.3-0.7 units/ml Monitor platelets by anticoagulation protocol: Yes   Plan:  Increase to heparin 1050 units/hr  Check 6hr heparin level  Daily heparin level, CBC Monitor s/sx of bleeding  Thank you for including pharmacy in the care of this patient.  44, PharmD PGY1 Acute Care Pharmacy Resident  Phone: [phone] 01/19/2021  8:08 AM  Please check [email or web address] for unit-specific pharmacy phone numbers.

## 2021-01-19 NOTE — Progress Notes (Signed)
Cardiology Progress Note  Patient ID: Wanda Wood MRN: [id number] DOB: Oct 30, 1970 Date of Encounter: 01/19/2021  Primary Cardiologist: None  Subjective   Chief Complaint: None.   HPI: Admitted with non-STEMI.  Denies chest pain or trouble breathing.  Telemetry unremarkable.  Left heart catheterization discussed.  ROS:  All other ROS reviewed and negative. Pertinent positives noted in the HPI.     Inpatient Medications  Scheduled Meds:  aspirin EC  81 mg Oral Daily   ezetimibe  10 mg Oral Daily   metoprolol tartrate  25 mg Oral BID   nitroGLYCERIN  0.4 mg Sublingual Once   rosuvastatin  40 mg Oral Daily   Continuous Infusions:  heparin 900 Units/hr (01/18/21 1724)   PRN Meds: acetaminophen, nitroGLYCERIN, ondansetron (ZOFRAN) IV   Vital Signs   Vitals:   01/18/21 2313 01/19/21 0406 01/19/21 0600 01/19/21 0735  BP: 121/83 107/63 119/76 110/79  Pulse: 77 83 88 78  Resp: 18 15 16 17   Temp: 97.9 F (36.6 C) 98 F (36.7 C) 98.3 F (36.8 C) 98.9 F (37.2 C)  TempSrc: Oral Oral  Oral  SpO2: 98% 98% 98% 96%  Weight:  69.2 kg    Height:        Intake/Output Summary (Last 24 hours) at 01/19/2021 0747 Last data filed at 01/19/2021 0600 Gross per 24 hour  Intake 273.03 ml  Output 400 ml  Net -126.97 ml   Last 3 Weights 01/19/2021 01/18/2021 01/18/2021  Weight (lbs) 152 lb 8 oz 156 lb 8 oz 155 lb  Weight (kg) 69.174 kg 70.988 kg 70.308 kg      Telemetry  Overnight telemetry shows sinus rhythm in the 70s, which I personally reviewed.   ECG  The most recent ECG shows sinus rhythm heart rate 77, anterior T wave inversions noted, which I personally reviewed.   Physical Exam   Vitals:   01/18/21 2313 01/19/21 0406 01/19/21 0600 01/19/21 0735  BP: 121/83 107/63 119/76 110/79  Pulse: 77 83 88 78  Resp: 18 15 16 17   Temp: 97.9 F (36.6 C) 98 F (36.7 C) 98.3 F (36.8 C) 98.9 F (37.2 C)  TempSrc: Oral Oral  Oral  SpO2: 98% 98% 98% 96%  Weight:  69.2 kg     Height:        Intake/Output Summary (Last 24 hours) at 01/19/2021 0747 Last data filed at 01/19/2021 0600 Gross per 24 hour  Intake 273.03 ml  Output 400 ml  Net -126.97 ml    Last 3 Weights 01/19/2021 01/18/2021 01/18/2021  Weight (lbs) 152 lb 8 oz 156 lb 8 oz 155 lb  Weight (kg) 69.174 kg 70.988 kg 70.308 kg    Body mass index is 26.18 kg/m.   General: Well nourished, well developed, in no acute distress Head: Atraumatic, normal size  Eyes: PEERLA, EOMI  Neck: Supple, no JVD Endocrine: No thryomegaly Cardiac: Normal S1, S2; RRR; no murmurs, rubs, or gallops Lungs: Clear to auscultation bilaterally, no wheezing, rhonchi or rales  Abd: Soft, nontender, no hepatomegaly  Ext: No edema, pulses 2+ Musculoskeletal: No deformities, BUE and BLE strength normal and equal Skin: Warm and dry, no rashes   Neuro: Alert and oriented to person, place, time, and situation, CNII-XII grossly intact, no focal deficits  Psych: Normal mood and affect   Labs  High Sensitivity Troponin:   Recent Labs  Lab 01/18/21 1349 01/18/21 1549 01/18/21 2141 01/19/21 0259  TROPONINIHS 485* 1,936* 1,749* 1,006*     Cardiac EnzymesNo results  for input(s): TROPONINI in the last 168 hours. No results for input(s): TROPIPOC in the last 168 hours.  Chemistry Recent Labs  Lab 01/18/21 1349 01/18/21 2141 01/19/21 0020  NA 138  --  138  K 4.2  --  3.8  CL 106  --  105  CO2 24  --  24  GLUCOSE 116*  --  98  BUN 7  --  6  CREATININE 0.76  --  0.72  CALCIUM 9.2  --  8.9  PROT  --  6.9  --   ALBUMIN  --  3.8  --   AST  --  20  --   ALT  --  12  --   ALKPHOS  --  69  --   BILITOT  --  0.8  --   GFRNONAA >60  --  >60  ANIONGAP 8  --  9    Hematology Recent Labs  Lab 01/18/21 1349 01/19/21 0020  WBC 7.3 10.5  RBC 4.63 4.67  HGB 13.7 13.7  HCT 41.8 41.2  MCV 90.3 88.2  MCH 29.6 29.3  MCHC 32.8 33.3  RDW 12.5 12.6  PLT 266 270   BNP Recent Labs  Lab 01/18/21 2141  BNP 227.3*     DDimer  Recent Labs  Lab 01/18/21 1625  DDIMER 0.48     Radiology  DG Chest 2 View  Result Date: 01/18/2021 CLINICAL DATA:  Chest pain EXAM: CHEST - 2 VIEW COMPARISON:  None. FINDINGS: Heart size and mediastinal contours are within normal limits. No suspicious pulmonary opacities identified. No pleural effusion or pneumothorax visualized. No acute osseous abnormality appreciated. IMPRESSION: No acute intrathoracic process identified. Electronically Signed   By: Jannifer Hick M.D.   On: 01/18/2021 14:33    Cardiac Studies  Echo pending  Patient Profile  Wanda Wood is a 50 y.o. 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  Assessment & Plan   #Non-STEMI -Admitted with burning in her chest over the past 6 months.  Also described as tightness.  Occurred at rest yesterday.  Had radiation into her left shoulder. -Presented to the ER with elevated troponin.  EKG with anterior T wave inversions.  Also describes some shortness of breath.  BNP slightly elevated. -Clinically she is euvolemic.  No further chest pain episodes. -Opponent is trending down which is a reassuring sign.  She describes no further episodes. -She has very high LDL cholesterol.  Suspect she either has severe LAD stenosis versus multivessel disease based on EKG. -Discussed left and right heart catheterization we will do this tomorrow.  She is hemodynamically stable.  She has no chest pain.  She is currently on aspirin, heparin drip, metoprolol.  Seems to be doing well. -TSH normal.  A1c 5.7. -Echo is pending. -She had a reaction to Lipitor years ago.  She is now on Zetia 10 mg daily.  She is on Crestor 40 mg daily.  Shared Decision Making/Informed Consent The risks [stroke (1 in 1000), death (1 in 1000), kidney failure [usually temporary] (1 in 500), bleeding (1 in 200), allergic reaction [possibly serious] (1 in 200)], benefits (diagnostic support and  management of coronary artery disease) and alternatives of a cardiac catheterization were discussed in detail with [name] and she is willing to proceed.  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  Her LDL cholesterol is much higher than would be expected for heterozygous FH. -Needs to  be considered for outpatient PCSK9 inhibitor. -Now admitted with non-STEMI. -Reports that her father had heart disease at a young age as well.  Points more strongly to hereditary/genetic etiology. -For now continue Zetia 10 mg daily.  She will be rechallenged on Crestor 40 mg daily.  FEN -no IVF -diet: heart health, npo at midnight -code: full -dvt ppx: heparin drip     For questions or updates, please contact [facility] Please consult [email or web address] for contact info under   Time Spent with Patient: I have spent a total of 35 minutes with patient reviewing hospital notes, telemetry, EKGs, labs and examining the patient as well as establishing an assessment and plan that was discussed with the patient.  > 50% of time was spent in direct patient care.    Signed, Lenna Gilford. Flora Lipps, MD, [facility]  [facility]  01/19/2021 7:47 AM

## 2021-01-19 NOTE — Progress Notes (Signed)
ANTICOAGULATION CONSULT NOTE - Follow Up Consult  Pharmacy Consult for heparin Indication: chest pain/ACS  Allergies  Allergen Reactions   Lactose Intolerance (Gi) Other (See Comments)    GI upset, SOB   Soy Allergy Other (See Comments)    GI upset   Sulfa Antibiotics Nausea And Vomiting    Patient Measurements: Height: 5\' 4"  (162.6 cm) Weight: 69.2 kg (152 lb 8 oz) IBW/kg (Calculated) : 54.7 Heparin Dosing Weight: 69.2 kg  Vital Signs: Temp: 98 F (36.7 C) (12/11 1602) Temp Source: Oral (12/11 1128) BP: 99/69 (12/11 1602) Pulse Rate: 72 (12/11 1602)  Labs: Recent Labs    01/18/21 1349 01/18/21 1549 01/18/21 2141 01/19/21 0020 01/19/21 0259 01/19/21 0716 01/19/21 1429  HGB 13.7  --   --  13.7  --   --   --   HCT 41.8  --   --  41.2  --   --   --   PLT 266  --   --  270  --   --   --   HEPARINUNFRC  --   --   --  0.47  --  0.27* 0.36  CREATININE 0.76  --   --  0.72  --   --   --   TROPONINIHS 485* 1,936* 1,749*  --  1,006*  --   --      Estimated Creatinine Clearance: 78.6 mL/min (by C-G formula based on SCr of 0.72 mg/dL).   Assessment: Wanda Wood with chest pain concerning for ACS. Pharmacy consulted to dose IV heparin.   Heparin level came back therapeutic at 0.36, on 1050 units/hr. CBC stable. No s/sx of bleeding or infusion issues.   Goal of Therapy:  Heparin level 0.3-0.7 units/ml Monitor platelets by anticoagulation protocol: Yes   Plan:  Continue heparin infusion at 1050 units/hr  Daily heparin level, CBC Monitor s/sx of bleeding  Thank you for including pharmacy in the care of this patient.  44, PharmD, BCCCP Clinical Pharmacist  Phone: [phone] 01/19/2021 4:49 PM  Please check AMION for all [facility] Pharmacy phone numbers After 10:00 PM, call Main Pharmacy [phone]

## 2021-01-20 ENCOUNTER — Inpatient Hospital Stay (HOSPITAL_COMMUNITY): Payer: Self-pay

## 2021-01-20 ENCOUNTER — Encounter (HOSPITAL_COMMUNITY)
Admission: EM | Disposition: A | Discharge status: Home / Self Care | Payer: Self-pay | Source: Home / Self Care | Attending: Cardiovascular Disease

## 2021-01-20 DIAGNOSIS — I2510 Atherosclerotic heart disease of native coronary artery without angina pectoris: Secondary | ICD-10-CM

## 2021-01-20 HISTORY — PX: LEFT HEART CATH AND CORONARY ANGIOGRAPHY: CATH118249

## 2021-01-20 HISTORY — PX: CORONARY STENT INTERVENTION: CATH118234

## 2021-01-20 LAB — CBC
HCT: 43.7 % (ref 36.0–46.0)
Hemoglobin: 14.7 g/dL (ref 12.0–15.0)
MCH: 29.9 pg (ref 26.0–34.0)
MCHC: 33.6 g/dL (ref 30.0–36.0)
MCV: 88.8 fL (ref 80.0–100.0)
Platelets: 252 10*3/uL (ref 150–400)
RBC: 4.92 MIL/uL (ref 3.87–5.11)
RDW: 12.5 % (ref 11.5–15.5)
WBC: 8.7 10*3/uL (ref 4.0–10.5)
nRBC: 0 % (ref 0.0–0.2)

## 2021-01-20 LAB — BASIC METABOLIC PANEL
Anion gap: 9 (ref 5–15)
BUN: 8 mg/dL (ref 6–20)
CO2: 22 mmol/L (ref 22–32)
Calcium: 8.8 mg/dL — ABNORMAL LOW (ref 8.9–10.3)
Chloride: 105 mmol/L (ref 98–111)
Creatinine, Ser: 0.67 mg/dL (ref 0.44–1.00)
GFR, Estimated: >60 mL/min (ref >60)
Glucose, Bld: 111 mg/dL — ABNORMAL HIGH (ref 70–99)
Potassium: 3.8 mmol/L (ref 3.5–5.1)
Sodium: 136 mmol/L (ref 135–145)

## 2021-01-20 LAB — HEPARIN LEVEL (UNFRACTIONATED): Heparin Unfractionated: 0.58 IU/mL (ref 0.30–0.70)

## 2021-01-20 LAB — POCT ACTIVATED CLOTTING TIME
Activated Clotting Time: 293 seconds
Activated Clotting Time: 209 seconds
Activated Clotting Time: 167 seconds

## 2021-01-20 SURGERY — LEFT HEART CATH AND CORONARY ANGIOGRAPHY
Anesthesia: LOCAL

## 2021-01-20 MED ORDER — MORPHINE SULFATE (PF) 2 MG/ML IV SOLN
INTRAVENOUS | Status: AC
  Filled 2021-01-20: qty 1

## 2021-01-20 MED ORDER — ROSUVASTATIN CALCIUM 20 MG PO TABS
40.0000 mg | ORAL_TABLET | Freq: Every day | ORAL | Status: DC
  Administered 2021-01-21 – 2021-01-22 (×2): 40 mg via ORAL
  Filled 2021-01-20 (×2): qty 2

## 2021-01-20 MED ORDER — FENTANYL CITRATE (PF) 100 MCG/2ML IJ SOLN
INTRAMUSCULAR | Status: DC | PRN
  Administered 2021-01-20 (×3): 25 ug via INTRAVENOUS

## 2021-01-20 MED ORDER — HEPARIN (PORCINE) IN NACL 1000-0.9 UT/500ML-% IV SOLN
INTRAVENOUS | Status: AC
  Filled 2021-01-20: qty 1000

## 2021-01-20 MED ORDER — TIROFIBAN HCL IN NACL 5-0.9 MG/100ML-% IV SOLN
0.1500 ug/kg/min | INTRAVENOUS | Status: AC
  Administered 2021-01-20: 0.15 ug/kg/min via INTRAVENOUS
  Filled 2021-01-20: qty 100

## 2021-01-20 MED ORDER — MORPHINE SULFATE (PF) 2 MG/ML IV SOLN
2.0000 mg | INTRAVENOUS | Status: DC | PRN
  Administered 2021-01-20 (×3): 2 mg via INTRAVENOUS
  Filled 2021-01-20: qty 1

## 2021-01-20 MED ORDER — METOPROLOL SUCCINATE ER 25 MG PO TB24: 25.0000 mg | ORAL_TABLET | Freq: Every day | ORAL | Status: DC

## 2021-01-20 MED ORDER — HYDRALAZINE HCL 20 MG/ML IJ SOLN: 10.0000 mg | INTRAMUSCULAR | Status: AC | PRN

## 2021-01-20 MED ORDER — MIDAZOLAM HCL 2 MG/2ML IJ SOLN
INTRAMUSCULAR | Status: AC
  Filled 2021-01-20: qty 2

## 2021-01-20 MED ORDER — SODIUM CHLORIDE 0.9 % IV SOLN: 250.0000 mL | INTRAVENOUS | Status: DC | PRN

## 2021-01-20 MED ORDER — LIDOCAINE HCL (PF) 1 % IJ SOLN
INTRAMUSCULAR | Status: AC
  Filled 2021-01-20: qty 30

## 2021-01-20 MED ORDER — ATORVASTATIN CALCIUM 80 MG PO TABS: 80.0000 mg | ORAL_TABLET | Freq: Every day | ORAL | Status: DC

## 2021-01-20 MED ORDER — LORAZEPAM 0.5 MG PO TABS
0.5000 mg | ORAL_TABLET | Freq: Two times a day (BID) | ORAL | Status: DC | PRN
  Administered 2021-01-20: 0.5 mg via ORAL
  Filled 2021-01-20: qty 1

## 2021-01-20 MED ORDER — HEPARIN SODIUM (PORCINE) 1000 UNIT/ML IJ SOLN
INTRAMUSCULAR | Status: DC | PRN
  Administered 2021-01-20: 7500 [IU] via INTRAVENOUS

## 2021-01-20 MED ORDER — TIROFIBAN HCL IN NACL 5-0.9 MG/100ML-% IV SOLN
INTRAVENOUS | Status: AC
  Filled 2021-01-20: qty 100

## 2021-01-20 MED ORDER — TICAGRELOR 90 MG PO TABS
ORAL_TABLET | ORAL | Status: DC | PRN
  Administered 2021-01-20: 180 mg via ORAL

## 2021-01-20 MED ORDER — SODIUM CHLORIDE 0.9 % IV SOLN: INTRAVENOUS | Status: AC

## 2021-01-20 MED ORDER — ACETAMINOPHEN 325 MG PO TABS: 650.0000 mg | ORAL_TABLET | ORAL | Status: DC | PRN

## 2021-01-20 MED ORDER — SACUBITRIL-VALSARTAN 24-26 MG PO TABS
1.0000 | ORAL_TABLET | Freq: Two times a day (BID) | ORAL | Status: DC
  Administered 2021-01-20: 1 via ORAL
  Filled 2021-01-20: qty 1

## 2021-01-20 MED ORDER — TIROFIBAN HCL IN NACL 5-0.9 MG/100ML-% IV SOLN
INTRAVENOUS | Status: AC | PRN
  Administered 2021-01-20: .15 ug/kg/min via INTRAVENOUS

## 2021-01-20 MED ORDER — HEPARIN (PORCINE) IN NACL 1000-0.9 UT/500ML-% IV SOLN
INTRAVENOUS | Status: DC | PRN
  Administered 2021-01-20 (×2): 500 mL

## 2021-01-20 MED ORDER — TICAGRELOR 90 MG PO TABS
ORAL_TABLET | ORAL | Status: AC
  Filled 2021-01-20: qty 2

## 2021-01-20 MED ORDER — FUROSEMIDE 10 MG/ML IJ SOLN
20.0000 mg | Freq: Once | INTRAMUSCULAR | Status: AC
  Administered 2021-01-20: 20 mg via INTRAVENOUS
  Filled 2021-01-20: qty 2

## 2021-01-20 MED ORDER — ONDANSETRON HCL 4 MG/2ML IJ SOLN: 4.0000 mg | Freq: Four times a day (QID) | INTRAMUSCULAR | Status: DC | PRN

## 2021-01-20 MED ORDER — FENTANYL CITRATE (PF) 100 MCG/2ML IJ SOLN
INTRAMUSCULAR | Status: AC
  Filled 2021-01-20: qty 2

## 2021-01-20 MED ORDER — ASPIRIN 81 MG PO CHEW
81.0000 mg | CHEWABLE_TABLET | Freq: Every day | ORAL | Status: DC
  Administered 2021-01-21 – 2021-01-22 (×2): 81 mg via ORAL
  Filled 2021-01-20 (×2): qty 1

## 2021-01-20 MED ORDER — VERAPAMIL HCL 2.5 MG/ML IV SOLN
INTRAVENOUS | Status: AC
  Filled 2021-01-20: qty 2

## 2021-01-20 MED ORDER — SODIUM CHLORIDE 0.9% FLUSH
3.0000 mL | Freq: Two times a day (BID) | INTRAVENOUS | Status: DC
  Administered 2021-01-20 – 2021-01-22 (×4): 3 mL via INTRAVENOUS

## 2021-01-20 MED ORDER — TIROFIBAN (AGGRASTAT) BOLUS VIA INFUSION
INTRAVENOUS | Status: DC | PRN
  Administered 2021-01-20: 1715 ug via INTRAVENOUS

## 2021-01-20 MED ORDER — NITROGLYCERIN 1 MG/10 ML FOR IR/CATH LAB
INTRA_ARTERIAL | Status: DC | PRN
  Administered 2021-01-20: 200 ug

## 2021-01-20 MED ORDER — MIDAZOLAM HCL 2 MG/2ML IJ SOLN
INTRAMUSCULAR | Status: DC | PRN
  Administered 2021-01-20 (×2): 1 mg via INTRAVENOUS

## 2021-01-20 MED ORDER — LABETALOL HCL 5 MG/ML IV SOLN: 10.0000 mg | INTRAVENOUS | Status: AC | PRN

## 2021-01-20 MED ORDER — HEPARIN SODIUM (PORCINE) 1000 UNIT/ML IJ SOLN
INTRAMUSCULAR | Status: AC
  Filled 2021-01-20: qty 10

## 2021-01-20 MED ORDER — NITROGLYCERIN 1 MG/10 ML FOR IR/CATH LAB
INTRA_ARTERIAL | Status: AC
  Filled 2021-01-20: qty 10

## 2021-01-20 MED ORDER — SODIUM CHLORIDE 0.9% FLUSH: 3.0000 mL | INTRAVENOUS | Status: DC | PRN

## 2021-01-20 MED ORDER — TICAGRELOR 90 MG PO TABS
90.0000 mg | ORAL_TABLET | Freq: Two times a day (BID) | ORAL | Status: DC
  Filled 2021-01-20: qty 1

## 2021-01-20 SURGICAL SUPPLY — 21 items
BALLN SAPPHIRE 2.0X12 (BALLOONS) ×2
BALLN SAPPHIRE ~~LOC~~ 2.5X12 (BALLOONS) ×1 IMPLANT
BALLOON SAPPHIRE 2.0X12 (BALLOONS) IMPLANT
CATH INFINITI 5FR MULTPACK ANG (CATHETERS) ×1 IMPLANT
CATH VISTA GUIDE 6FR XBLAD3.0 (CATHETERS) ×1 IMPLANT
COVER SWIFTLINK CONNECTOR (BAG) ×1 IMPLANT
GLIDESHEATH SLEND A-KIT 6F 22G (SHEATH) ×1 IMPLANT
GUIDEWIRE INQWIRE 1.5J.035X260 (WIRE) IMPLANT
INQWIRE 1.5J .035X260CM (WIRE) ×2
KIT ENCORE 26 ADVANTAGE (KITS) ×1 IMPLANT
KIT HEART LEFT (KITS) ×2 IMPLANT
PACK CARDIAC CATHETERIZATION (CUSTOM PROCEDURE TRAY) ×2 IMPLANT
SHEATH PINNACLE 5F 10CM (SHEATH) ×1 IMPLANT
SHEATH PINNACLE 6F 10CM (SHEATH) ×1 IMPLANT
STENT ONYX FRONTIER 2.25X15 (Permanent Stent) ×1 IMPLANT
SYR MEDRAD MARK 7 150ML (SYRINGE) ×2 IMPLANT
TRANSDUCER W/STOPCOCK (MISCELLANEOUS) ×2 IMPLANT
TUBING CIL FLEX 10 FLL-RA (TUBING) ×2 IMPLANT
WIRE ASAHI PROWATER 180CM (WIRE) ×1 IMPLANT
WIRE EMERALD 3MM-J .035X150CM (WIRE) ×1 IMPLANT
WIRE HI TORQ VERSACORE-J 145CM (WIRE) ×1 IMPLANT

## 2021-01-20 NOTE — Progress Notes (Signed)
IVT consulted for difficult PIV placement.  Pt in restroom upon arrival; come back after 0830

## 2021-01-20 NOTE — Progress Notes (Signed)
ANTICOAGULATION CONSULT NOTE - Follow Up Consult  Pharmacy Consult for heparin Indication: chest pain/ACS  Allergies  Allergen Reactions   Lactose Intolerance (Gi) Other (See Comments)    GI upset, SOB   Soy Allergy Other (See Comments)    GI upset   Sulfa Antibiotics Nausea And Vomiting    Patient Measurements: Height: 5\' 4"  (162.6 cm) Weight: 68.6 kg (151 lb 3.2 oz) IBW/kg (Calculated) : 54.7 Heparin Dosing Weight: 69.2 kg  Vital Signs: Temp: 97.9 F (36.6 C) (12/12 0727) Temp Source: Oral (12/12 0407) BP: 111/71 (12/12 0727) Pulse Rate: 80 (12/12 0727)  Labs: Recent Labs    01/18/21 1349 01/18/21 1349 01/18/21 1549 01/18/21 2141 01/19/21 0020 01/19/21 0259 01/19/21 0716 01/19/21 1429 01/20/21 0340 01/20/21 0830  HGB 13.7  --   --   --  13.7  --   --   --  14.7  --   HCT 41.8  --   --   --  41.2  --   --   --  43.7  --   PLT 266  --   --   --  270  --   --   --  252  --   HEPARINUNFRC  --    < >  --   --  0.47  --  0.27* 0.36  --  0.58  CREATININE 0.76  --   --   --  0.72  --   --   --  0.67  --   TROPONINIHS 485*  --  1,936* 1,749*  --  1,006*  --   --   --   --    < > = values in this interval not displayed.     Estimated Creatinine Clearance: 78.3 mL/min (by C-G formula based on SCr of 0.67 mg/dL).   Assessment: 79 YOF with chest pain concerning for ACS. Pharmacy consulted to dose IV heparin. Ruled in for NSTEMI.  Heparin level therapeutic at 0.58 on 1050 units/hr. CBC stable. No s/sx of bleeding.   Goal of Therapy:  Heparin level 0.3-0.7 units/ml Monitor platelets by anticoagulation protocol: Yes   Plan:  Continue heparin infusion at 1050 units/hr  Daily heparin level, CBC Monitor s/sx of bleeding F/U after cath  Thank you for involving pharmacy in this patient's care.  44, PharmD, BCPS Clinical Pharmacist Clinical phone for 01/20/2021 until 3p is [phone] 01/20/2021 9:40 AM  **Pharmacist phone directory can be found on  [email or web address] listed under [facility]**

## 2021-01-20 NOTE — Social Work (Signed)
  Transition of Care [facility]) Screening Note   Patient Details  Name: Wanda Wood Date of Birth: 1970/10/01   Transition of Care [facility]) CM/SW Contact:    Delilah Shan, LCSWA Phone Number: 01/20/2021, 4:37 PM    Transition of Care Department [facility]) has reviewed patient and no TOC needs have been identified at this time. We will continue to monitor patient advancement through interdisciplinary progression rounds. If new patient transition needs arise, please place a TOC consult.

## 2021-01-20 NOTE — H&P (View-Only) (Signed)
Cardiology Progress Note  Patient ID: Wanda Wood MRN: [id number] DOB: 10-23-1970 Date of Encounter: 01/20/2021  Primary Cardiologist: None  Subjective   Chief Complaint: CP  HPI: Describes 4/10 squeezing chest pressure this morning.  Denies shortness of breath.  Appears slightly volume up today.  Receiving fluids for tachycardia.  ROS:  All other ROS reviewed and negative. Pertinent positives noted in the HPI.     Inpatient Medications  Scheduled Meds:  aspirin EC  81 mg Oral Daily   ezetimibe  10 mg Oral Daily   metoprolol tartrate  25 mg Oral BID   nitroGLYCERIN  0.4 mg Sublingual Once   rosuvastatin  40 mg Oral Daily   sodium chloride flush  3 mL Intravenous Q12H   Continuous Infusions:  sodium chloride     sodium chloride 1 mL/kg/hr (01/20/21 0518)   heparin 1,050 Units/hr (01/19/21 1425)   PRN Meds: sodium chloride, acetaminophen, nitroGLYCERIN, ondansetron (ZOFRAN) IV, sodium chloride flush   Vital Signs   Vitals:   01/19/21 1602 01/19/21 2027 01/20/21 0407 01/20/21 0727  BP: 99/69 113/72 112/62 111/71  Pulse: 72 72 72 80  Resp: 18 18 18 18   Temp: 98 F (36.7 C) 98.1 F (36.7 C) 98.2 F (36.8 C) 97.9 F (36.6 C)  TempSrc: Oral Oral Oral   SpO2: 98% 99% 98% 98%  Weight:   68.6 kg   Height:        Intake/Output Summary (Last 24 hours) at 01/20/2021 0808 Last data filed at 01/20/2021 0300 Gross per 24 hour  Intake 1513.02 ml  Output 2650 ml  Net -1136.98 ml   Last 3 Weights 01/20/2021 01/19/2021 01/18/2021  Weight (lbs) 151 lb 3.2 oz 152 lb 8 oz 156 lb 8 oz  Weight (kg) 68.584 kg 69.174 kg 70.988 kg      Telemetry  Overnight telemetry shows sinus rhythm in the 70s, which I personally reviewed.   ECG  The most recent ECG shows sinus rhythm, sinus rhythm, deep diffuse anterolateral T wave inversions which I personally reviewed.   Physical Exam   Vitals:   01/19/21 1602 01/19/21 2027 01/20/21 0407 01/20/21 0727  BP: 99/69 113/72 112/62  111/71  Pulse: 72 72 72 80  Resp: 18 18 18 18   Temp: 98 F (36.7 C) 98.1 F (36.7 C) 98.2 F (36.8 C) 97.9 F (36.6 C)  TempSrc: Oral Oral Oral   SpO2: 98% 99% 98% 98%  Weight:   68.6 kg   Height:        Intake/Output Summary (Last 24 hours) at 01/20/2021 0808 Last data filed at 01/20/2021 0300 Gross per 24 hour  Intake 1513.02 ml  Output 2650 ml  Net -1136.98 ml    Last 3 Weights 01/20/2021 01/19/2021 01/18/2021  Weight (lbs) 151 lb 3.2 oz 152 lb 8 oz 156 lb 8 oz  Weight (kg) 68.584 kg 69.174 kg 70.988 kg    Body mass index is 25.95 kg/m.   General: Well nourished, well developed, in no acute distress Head: Atraumatic, normal size  Eyes: PEERLA, EOMI  Neck: Supple, JVD 7 to 8 cm of water Endocrine: No thryomegaly Cardiac: Normal S1, S2; RRR; no murmurs, rubs, or gallops Lungs: Clear to auscultation bilaterally, no wheezing, rhonchi or rales  Abd: Soft, nontender, no hepatomegaly  Ext: No edema, pulses 2+ Musculoskeletal: No deformities, BUE and BLE strength normal and equal Skin: Warm and dry, no rashes   Neuro: Alert and oriented to person, place, time, and situation, CNII-XII grossly intact, no  focal deficits  Psych: Normal mood and affect   Labs  High Sensitivity Troponin:   Recent Labs  Lab 01/18/21 1349 01/18/21 1549 01/18/21 2141 01/19/21 0259  TROPONINIHS 485* 1,936* 1,749* 1,006*     Cardiac EnzymesNo results for input(s): TROPONINI in the last 168 hours. No results for input(s): TROPIPOC in the last 168 hours.  Chemistry Recent Labs  Lab 01/18/21 1349 01/18/21 2141 01/19/21 0020 01/20/21 0340  NA 138  --  138 136  K 4.2  --  3.8 3.8  CL 106  --  105 105  CO2 24  --  24 22  GLUCOSE 116*  --  98 111*  BUN 7  --  6 8  CREATININE 0.76  --  0.72 0.67  CALCIUM 9.2  --  8.9 8.8*  PROT  --  6.9  --   --   ALBUMIN  --  3.8  --   --   AST  --  20  --   --   ALT  --  12  --   --   ALKPHOS  --  69  --   --   BILITOT  --  0.8  --   --   GFRNONAA  >60  --  >60 >60  ANIONGAP 8  --  9 9    Hematology Recent Labs  Lab 01/18/21 1349 01/19/21 0020 01/20/21 0340  WBC 7.3 10.5 8.7  RBC 4.63 4.67 4.92  HGB 13.7 13.7 14.7  HCT 41.8 41.2 43.7  MCV 90.3 88.2 88.8  MCH 29.6 29.3 29.9  MCHC 32.8 33.3 33.6  RDW 12.5 12.6 12.5  PLT 266 270 252   BNP Recent Labs  Lab 01/18/21 2141  BNP 227.3*    DDimer  Recent Labs  Lab 01/18/21 1625  DDIMER 0.48     Radiology  DG Chest 2 View  Result Date: 01/18/2021 CLINICAL DATA:  Chest pain EXAM: CHEST - 2 VIEW COMPARISON:  None. FINDINGS: Heart size and mediastinal contours are within normal limits. No suspicious pulmonary opacities identified. No pleural effusion or pneumothorax visualized. No acute osseous abnormality appreciated. IMPRESSION: No acute intrathoracic process identified. Electronically Signed   By: Jannifer Hick M.D.   On: 01/18/2021 14:33    Cardiac Studies  Echo pending   Patient Profile  Wanda Wood is a 50 y.o. 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  Assessment & Plan   #Non-STEMI -Admitted with intermittent burning in her chest over the last 6 months.  Also described a squeezing sensation.  Occurred at rest prior to admission.  She is ruled in for non-STEMI.  Troponins have peaked and are trending down. -EKG continues to demonstrate deep severe anterolateral T wave inversions.  She is resting comfortably and without any alarming symptoms.  She did have some squeezing in her chest this morning but reports she does not want to take nitroglycerin.  She actually was sleeping so I think this is okay.  Left heart cath today regardless. -Risk and benefits of left heart catheterization discussed.  She is willing to proceed.  See consent statement below. -Her BNP is slightly elevated she has JVD up to 7 to 8 cm of water.  She is in no distress.  Echo is pending.  We will hold diuresis as she is quite  stable.  We will get an idea of LVEDP at time of heart cath.  Suspect she will need diuresis. -Currently on aspirin,  heparin drip.  She is on a beta-blocker. -LDL cholesterol severely elevated.  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  Likely will pursue genetic testing with me when she is out of the hospital. -TSH normal.  A1c 5.7.  Shared Decision Making/Informed Consent The risks [stroke (1 in 1000), death (1 in 1000), kidney failure [usually temporary] (1 in 500), bleeding (1 in 200), allergic reaction [possibly serious] (1 in 200)], benefits (diagnostic support and management of coronary artery disease) and alternatives of a cardiac catheterization were discussed in detail with Ms. Broerman and she is willing to proceed.  #HLD -Severely elevated LDL cholesterol.  She has had this for years.  She was told at age 50 she need to be on lipid-lowering agents.  She did not tolerate Lipitor and has not really seen Dr. Since. -Rechallenged on Crestor 40 mg daily.  Also on Zetia 10 mg daily.  Seems to be tolerating these.  She will ultimately need outpatient PCSK9 inhibitor therapy versus Leqvio. -Also will likely need genetic testing for familial hyperlipidemia.  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  FEN -Precath fluids -Diet: N.p.o. for left heart cath -Code: Full -DVT PPx: Heparin drip  For questions or updates, please contact [facility] Please consult [email or web address] for contact info under   Time Spent with Patient: I have spent a total of 35 minutes with patient reviewing hospital notes, telemetry, EKGs, labs and examining the patient as well as establishing an assessment and plan that was discussed with the patient.  > 50% of time was spent in direct patient care.    Signed, Lenna Gilford. Flora Lipps, MD, [facility]  [facility]  01/20/2021 8:09 AM

## 2021-01-20 NOTE — Progress Notes (Signed)
Site area: RT GROIN Site Prior to Removal:  Level O Pressure Applied For:20 MINUTES Manual:   YES Patient Status During Pull:  AWAKE Post Pull Site:  Level O Post Pull Instructions Given:  YES Post Pull Pulses Present: RT DP PALPABLE Dressing Applied:  YES Bedrest begins @ 0320 Comments:

## 2021-01-20 NOTE — Progress Notes (Signed)
Pt with anxiety.  She had ativan 0.5 last pm with help.  Will repeat now but will not be able to provide at discharge.  It seems it is only due to procedure pre and post.

## 2021-01-20 NOTE — Plan of Care (Signed)
  Problem: Skin Integrity: Goal: Risk for impaired skin integrity will decrease Outcome: Completed/Met   Problem: Pain Managment: Goal: General experience of comfort will improve Outcome: Completed/Met   Problem: Elimination: Goal: Will not experience complications related to urinary retention Outcome: Completed/Met   Problem: Nutrition: Goal: Adequate nutrition will be maintained Outcome: Completed/Met   Problem: Activity: Goal: Risk for activity intolerance will decrease Outcome: Completed/Met

## 2021-01-20 NOTE — Interval H&P Note (Signed)
Cath Lab Visit (complete for each Cath Lab visit)  Clinical Evaluation Leading to the Procedure:   ACS: Yes.    Non-ACS:    Anginal Classification: CCS II  Anti-ischemic medical therapy: No Therapy  Non-Invasive Test Results: No non-invasive testing performed  Prior CABG: No previous CABG      History and Physical Interval Note:  01/20/2021 10:10 AM  Wanda Wood  has presented today for surgery, with the diagnosis of chest pain.  The various methods of treatment have been discussed with the patient and family. After consideration of risks, benefits and other options for treatment, the patient has consented to  Procedure(s): LEFT HEART CATH AND CORONARY ANGIOGRAPHY (N/A) as a surgical intervention.  The patient's history has been reviewed, patient examined, no change in status, stable for surgery.  I have reviewed the patient's chart and labs.  Questions were answered to the patient's satisfaction.     [name]

## 2021-01-20 NOTE — Progress Notes (Signed)
Cardiology Progress Note  Patient ID: Wanda Wood MRN: [id number] DOB: 10/09/1970 Date of Encounter: 01/20/2021  Primary Cardiologist: None  Subjective   Chief Complaint: CP  HPI: Describes 4/10 squeezing chest pressure this morning.  Denies shortness of breath.  Appears slightly volume up today.  Receiving fluids for tachycardia.  ROS:  All other ROS reviewed and negative. Pertinent positives noted in the HPI.     Inpatient Medications  Scheduled Meds:  aspirin EC  81 mg Oral Daily   ezetimibe  10 mg Oral Daily   metoprolol tartrate  25 mg Oral BID   nitroGLYCERIN  0.4 mg Sublingual Once   rosuvastatin  40 mg Oral Daily   sodium chloride flush  3 mL Intravenous Q12H   Continuous Infusions:  sodium chloride     sodium chloride 1 mL/kg/hr (01/20/21 0518)   heparin 1,050 Units/hr (01/19/21 1425)   PRN Meds: sodium chloride, acetaminophen, nitroGLYCERIN, ondansetron (ZOFRAN) IV, sodium chloride flush   Vital Signs   Vitals:   01/19/21 1602 01/19/21 2027 01/20/21 0407 01/20/21 0727  BP: 99/69 113/72 112/62 111/71  Pulse: 72 72 72 80  Resp: 18 18 18 18  Temp: 98 F (36.7 C) 98.1 F (36.7 C) 98.2 F (36.8 C) 97.9 F (36.6 C)  TempSrc: Oral Oral Oral   SpO2: 98% 99% 98% 98%  Weight:   68.6 kg   Height:        Intake/Output Summary (Last 24 hours) at 01/20/2021 0808 Last data filed at 01/20/2021 0300 Gross per 24 hour  Intake 1513.02 ml  Output 2650 ml  Net -1136.98 ml   Last 3 Weights 01/20/2021 01/19/2021 01/18/2021  Weight (lbs) 151 lb 3.2 oz 152 lb 8 oz 156 lb 8 oz  Weight (kg) 68.584 kg 69.174 kg 70.988 kg      Telemetry  Overnight telemetry shows sinus rhythm in the 70s, which I personally reviewed.   ECG  The most recent ECG shows sinus rhythm, sinus rhythm, deep diffuse anterolateral T wave inversions which I personally reviewed.   Physical Exam   Vitals:   01/19/21 1602 01/19/21 2027 01/20/21 0407 01/20/21 0727  BP: 99/69 113/72 112/62  111/71  Pulse: 72 72 72 80  Resp: 18 18 18 18  Temp: 98 F (36.7 C) 98.1 F (36.7 C) 98.2 F (36.8 C) 97.9 F (36.6 C)  TempSrc: Oral Oral Oral   SpO2: 98% 99% 98% 98%  Weight:   68.6 kg   Height:        Intake/Output Summary (Last 24 hours) at 01/20/2021 0808 Last data filed at 01/20/2021 0300 Gross per 24 hour  Intake 1513.02 ml  Output 2650 ml  Net -1136.98 ml    Last 3 Weights 01/20/2021 01/19/2021 01/18/2021  Weight (lbs) 151 lb 3.2 oz 152 lb 8 oz 156 lb 8 oz  Weight (kg) 68.584 kg 69.174 kg 70.988 kg    Body mass index is 25.95 kg/m.   General: Well nourished, well developed, in no acute distress Head: Atraumatic, normal size  Eyes: PEERLA, EOMI  Neck: Supple, JVD 7 to 8 cm of water Endocrine: No thryomegaly Cardiac: Normal S1, S2; RRR; no murmurs, rubs, or gallops Lungs: Clear to auscultation bilaterally, no wheezing, rhonchi or rales  Abd: Soft, nontender, no hepatomegaly  Ext: No edema, pulses 2+ Musculoskeletal: No deformities, BUE and BLE strength normal and equal Skin: Warm and dry, no rashes   Neuro: Alert and oriented to person, place, time, and situation, CNII-XII grossly intact, no   focal deficits  Psych: Normal mood and affect   Labs  High Sensitivity Troponin:   Recent Labs  Lab 01/18/21 1349 01/18/21 1549 01/18/21 2141 01/19/21 0259  TROPONINIHS 485* 1,936* 1,749* 1,006*     Cardiac EnzymesNo results for input(s): TROPONINI in the last 168 hours. No results for input(s): TROPIPOC in the last 168 hours.  Chemistry Recent Labs  Lab 01/18/21 1349 01/18/21 2141 01/19/21 0020 01/20/21 0340  NA 138  --  138 136  K 4.2  --  3.8 3.8  CL 106  --  105 105  CO2 24  --  24 22  GLUCOSE 116*  --  98 111*  BUN 7  --  6 8  CREATININE 0.76  --  0.72 0.67  CALCIUM 9.2  --  8.9 8.8*  PROT  --  6.9  --   --   ALBUMIN  --  3.8  --   --   AST  --  20  --   --   ALT  --  12  --   --   ALKPHOS  --  69  --   --   BILITOT  --  0.8  --   --   GFRNONAA  >60  --  >60 >60  ANIONGAP 8  --  9 9    Hematology Recent Labs  Lab 01/18/21 1349 01/19/21 0020 01/20/21 0340  WBC 7.3 10.5 8.7  RBC 4.63 4.67 4.92  HGB 13.7 13.7 14.7  HCT 41.8 41.2 43.7  MCV 90.3 88.2 88.8  MCH 29.6 29.3 29.9  MCHC 32.8 33.3 33.6  RDW 12.5 12.6 12.5  PLT 266 270 252   BNP Recent Labs  Lab 01/18/21 2141  BNP 227.3*    DDimer  Recent Labs  Lab 01/18/21 1625  DDIMER 0.48     Radiology  DG Chest 2 View  Result Date: 01/18/2021 CLINICAL DATA:  Chest pain EXAM: CHEST - 2 VIEW COMPARISON:  None. FINDINGS: Heart size and mediastinal contours are within normal limits. No suspicious pulmonary opacities identified. No pleural effusion or pneumothorax visualized. No acute osseous abnormality appreciated. IMPRESSION: No acute intrathoracic process identified. Electronically Signed   By: Delaney  Williams M.D.   On: 01/18/2021 14:33    Cardiac Studies  Echo pending   Patient Profile  Hadessah Mims is a 50 y.o. 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  Assessment & Plan   #Non-STEMI -Admitted with intermittent burning in her chest over the last 6 months.  Also described a squeezing sensation.  Occurred at rest prior to admission.  She is ruled in for non-STEMI.  Troponins have peaked and are trending down. -EKG continues to demonstrate deep severe anterolateral T wave inversions.  She is resting comfortably and without any alarming symptoms.  She did have some squeezing in her chest this morning but reports she does not want to take nitroglycerin.  She actually was sleeping so I think this is okay.  Left heart cath today regardless. -Risk and benefits of left heart catheterization discussed.  She is willing to proceed.  See consent statement below. -Her BNP is slightly elevated she has JVD up to 7 to 8 cm of water.  She is in no distress.  Echo is pending.  We will hold diuresis as she is quite  stable.  We will get an idea of LVEDP at time of heart cath.  Suspect she will need diuresis. -Currently on aspirin,   heparin drip.  She is on a beta-blocker. -LDL cholesterol severely elevated.  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  Likely will pursue genetic testing with me when she is out of the hospital. -TSH normal.  A1c 5.7.  Shared Decision Making/Informed Consent The risks [stroke (1 in 1000), death (1 in 1000), kidney failure [usually temporary] (1 in 500), bleeding (1 in 200), allergic reaction [possibly serious] (1 in 200)], benefits (diagnostic support and management of coronary artery disease) and alternatives of a cardiac catheterization were discussed in detail with Ms. Palazzola and she is willing to proceed.  #HLD -Severely elevated LDL cholesterol.  She has had this for years.  She was told at age 50 she need to be on lipid-lowering agents.  She did not tolerate Lipitor and has not really seen Dr. Since. -Rechallenged on Crestor 40 mg daily.  Also on Zetia 10 mg daily.  Seems to be tolerating these.  She will ultimately need outpatient PCSK9 inhibitor therapy versus Leqvio. -Also will likely need genetic testing for familial hyperlipidemia.  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  FEN -Precath fluids -Diet: N.p.o. for left heart cath -Code: Full -DVT PPx: Heparin drip  For questions or updates, please contact [facility] Please consult [email or web address] for contact info under   Time Spent with Patient: I have spent a total of 35 minutes with patient reviewing hospital notes, telemetry, EKGs, labs and examining the patient as well as establishing an assessment and plan that was discussed with the patient.  > 50% of time was spent in direct patient care.    Signed, Lenna Gilford. Flora Lipps, MD, [facility]  [facility]  01/20/2021 8:09 AM

## 2021-01-21 ENCOUNTER — Encounter (HOSPITAL_COMMUNITY): Payer: Self-pay | Admitting: Cardiovascular Disease

## 2021-01-21 ENCOUNTER — Inpatient Hospital Stay (HOSPITAL_COMMUNITY): Payer: Self-pay

## 2021-01-21 ENCOUNTER — Other Ambulatory Visit (HOSPITAL_COMMUNITY): Payer: Self-pay

## 2021-01-21 DIAGNOSIS — I214 Non-ST elevation (NSTEMI) myocardial infarction: Secondary | ICD-10-CM

## 2021-01-21 DIAGNOSIS — I959 Hypotension, unspecified: Secondary | ICD-10-CM

## 2021-01-21 LAB — ECHOCARDIOGRAM COMPLETE
Area-P 1/2: 4.8 cm2
Calc EF: 39.2 %
Height: 64 in
S' Lateral: 3.1 cm
Single Plane A2C EF: 36.9 %
Single Plane A4C EF: 38.9 %
Weight: 2393.6 [oz_av]

## 2021-01-21 LAB — CBC
HCT: 44.9 % (ref 36.0–46.0)
Hemoglobin: 15.3 g/dL — ABNORMAL HIGH (ref 12.0–15.0)
MCH: 29.7 pg (ref 26.0–34.0)
MCHC: 34.1 g/dL (ref 30.0–36.0)
MCV: 87 fL (ref 80.0–100.0)
Platelets: 263 10*3/uL (ref 150–400)
RBC: 5.16 MIL/uL — ABNORMAL HIGH (ref 3.87–5.11)
RDW: 12.6 % (ref 11.5–15.5)
WBC: 11.8 10*3/uL — ABNORMAL HIGH (ref 4.0–10.5)
nRBC: 0 % (ref 0.0–0.2)

## 2021-01-21 LAB — BASIC METABOLIC PANEL
Anion gap: 12 (ref 5–15)
BUN: 6 mg/dL (ref 6–20)
CO2: 20 mmol/L — ABNORMAL LOW (ref 22–32)
Calcium: 8.9 mg/dL (ref 8.9–10.3)
Chloride: 101 mmol/L (ref 98–111)
Creatinine, Ser: 0.83 mg/dL (ref 0.44–1.00)
GFR, Estimated: >60 mL/min (ref >60)
Glucose, Bld: 109 mg/dL — ABNORMAL HIGH (ref 70–99)
Potassium: 3.5 mmol/L (ref 3.5–5.1)
Sodium: 133 mmol/L — ABNORMAL LOW (ref 135–145)

## 2021-01-21 LAB — LACTIC ACID, PLASMA
Lactic Acid, Venous: 1.3 mmol/L (ref 0.5–1.9)
Lactic Acid, Venous: 2.3 mmol/L (ref 0.5–1.9)

## 2021-01-21 MED ORDER — PERFLUTREN LIPID MICROSPHERE
1.0000 mL | INTRAVENOUS | Status: AC | PRN
Start: 2021-01-21 — End: 2021-01-21
  Administered 2021-01-21: 2 mL via INTRAVENOUS
  Filled 2021-01-21: qty 10

## 2021-01-21 MED ORDER — CLOPIDOGREL BISULFATE 75 MG PO TABS: 75.0000 mg | ORAL_TABLET | Freq: Every day | ORAL | Status: DC

## 2021-01-21 MED ORDER — POTASSIUM CHLORIDE CRYS ER 20 MEQ PO TBCR
40.0000 meq | EXTENDED_RELEASE_TABLET | Freq: Once | ORAL | Status: AC
  Administered 2021-01-21: 40 meq via ORAL
  Filled 2021-01-21: qty 2

## 2021-01-21 MED ORDER — DAPAGLIFLOZIN PROPANEDIOL 10 MG PO TABS
10.0000 mg | ORAL_TABLET | Freq: Every day | ORAL | Status: DC
  Administered 2021-01-21 – 2021-01-22 (×2): 10 mg via ORAL
  Filled 2021-01-21 (×2): qty 1

## 2021-01-21 MED ORDER — DIGOXIN 125 MCG PO TABS
0.1250 mg | ORAL_TABLET | Freq: Every day | ORAL | Status: DC
  Administered 2021-01-21 – 2021-01-22 (×2): 0.125 mg via ORAL
  Filled 2021-01-21 (×2): qty 1

## 2021-01-21 MED ORDER — TICAGRELOR 90 MG PO TABS
90.0000 mg | ORAL_TABLET | Freq: Two times a day (BID) | ORAL | Status: DC
  Administered 2021-01-21 – 2021-01-22 (×3): 90 mg via ORAL
  Filled 2021-01-21 (×3): qty 1

## 2021-01-21 MED ORDER — LOSARTAN POTASSIUM 25 MG PO TABS
12.5000 mg | ORAL_TABLET | Freq: Every day | ORAL | Status: DC
  Administered 2021-01-21: 12.5 mg via ORAL
  Filled 2021-01-21: qty 1

## 2021-01-21 NOTE — Care Management (Signed)
01-21-21 Patient is without insurance and primary care provider. Case Manager scheduled an appointment with the [facility]. Information placed on the AVS. Case Manager will follow the patient for [facility] assistance.

## 2021-01-21 NOTE — Progress Notes (Signed)
Pt.c/o of feeling like almost passing out when she tried to get  oob to a bedside commode which also happened last night. V/S taken  B/P =104/64;HR=95. MD on call [place] made aware .Will continue to monitor pt.

## 2021-01-21 NOTE — Progress Notes (Signed)
CARDIAC REHAB PHASE I   PRE:  Rate/Rhythm: 80-120 SR    BP: lying 85/65, sitting 82/63    SaO2: 99 RA  Pt hypotensive today, held ambulation for now. Encouraged breakfast and trying to get up later with staff. Discussed with pt and mother MI, stent, Brilinta, HF management, low sodium diet, and CRPII. Very receptive. Will refer to [place] CRPII. Will f/u tomorrow. [id number]  Harriet Masson CES, ACSM 01/21/2021 9:52 AM

## 2021-01-21 NOTE — Progress Notes (Signed)
Cardiology Progress Note  Patient ID: Wanda Wood MRN: [id number] DOB: 1970-02-12 Date of Encounter: 01/21/2021  Primary Cardiologist: None  Subjective   Chief Complaint: Dizziness.  HPI: Blood pressure low this morning.  EKG shows improving T wave inversions.  We discussed the need for medications.  We will add digoxin to see if this helps.  Echo is pending.  ROS:  All other ROS reviewed and negative. Pertinent positives noted in the HPI.     Inpatient Medications  Scheduled Meds:  aspirin  81 mg Oral Daily   dapagliflozin propanediol  10 mg Oral Daily   digoxin  0.125 mg Oral Daily   ezetimibe  10 mg Oral Daily   nitroGLYCERIN  0.4 mg Sublingual Once   rosuvastatin  40 mg Oral Daily   sodium chloride flush  3 mL Intravenous Q12H   sodium chloride flush  3 mL Intravenous Q12H   ticagrelor  90 mg Oral BID   Continuous Infusions:  sodium chloride     PRN Meds: sodium chloride, acetaminophen, LORazepam, morphine injection, nitroGLYCERIN, ondansetron (ZOFRAN) IV, sodium chloride flush   Vital Signs   Vitals:   01/20/21 2142 01/20/21 2217 01/21/21 0013 01/21/21 0426  BP: (!) 89/62 102/67 113/80 104/64  Pulse: 85 92 96 95  Resp: _0 Temp: 98 F (36.7 C)   98 F (36.7 C)  TempSrc: Oral   Oral  SpO2: 99% 99% 100% 98%  Weight:    67.9 kg  Height:        Intake/Output Summary (Last 24 hours) at 01/21/2021 0911 Last data filed at 01/20/2021 2130 Gross per 24 hour  Intake 1317.78 ml  Output 600 ml  Net 717.78 ml   Last 3 Weights 01/21/2021 01/20/2021 01/19/2021  Weight (lbs) 149 lb 9.6 oz 151 lb 3.2 oz 152 lb 8 oz  Weight (kg) 67.858 kg 68.584 kg 69.174 kg      Telemetry  Overnight telemetry shows sinus tachycardia in the low 100s, which I personally reviewed.   ECG  The most recent ECG shows sinus rhythm 90, anterior lateral T wave inversions, which I personally reviewed.   Physical Exam   Vitals:   01/20/21 2142 01/20/21 2217 01/21/21 0013  01/21/21 0426  BP: (!) 89/62 102/67 113/80 104/64  Pulse: 85 92 96 95  Resp: _1 Temp: 98 F (36.7 C)   98 F (36.7 C)  TempSrc: Oral   Oral  SpO2: 99% 99% 100% 98%  Weight:    67.9 kg  Height:        Intake/Output Summary (Last 24 hours) at 01/21/2021 0911 Last data filed at 01/20/2021 2130 Gross per 24 hour  Intake 1317.78 ml  Output 600 ml  Net 717.78 ml    Last 3 Weights 01/21/2021 01/20/2021 01/19/2021  Weight (lbs) 149 lb 9.6 oz 151 lb 3.2 oz 152 lb 8 oz  Weight (kg) 67.858 kg 68.584 kg 69.174 kg    Body mass index is 25.68 kg/m.  General: Well nourished, well developed, in no acute distress Head: Atraumatic, normal size  Eyes: PEERLA, EOMI  Neck: Supple, no JVD Endocrine: No thryomegaly Cardiac: Normal S1, S2; RRR; no murmurs, rubs, or gallops Lungs: Clear to auscultation bilaterally, no wheezing, rhonchi or rales  Abd: Soft, nontender, no hepatomegaly  Ext: No edema, pulses 2+, right femoral cath site clean and dry Musculoskeletal: No deformities, BUE and BLE strength normal and equal Skin: Warm and dry, no rashes   Neuro:  Alert and oriented to person, place, time, and situation, CNII-XII grossly intact, no focal deficits  Psych: Normal mood and affect   Labs  High Sensitivity Troponin:   Recent Labs  Lab 01/18/21 1349 01/18/21 1549 01/18/21 2141 01/19/21 0259  TROPONINIHS 485* 1,936* 1,749* 1,006*     Cardiac EnzymesNo results for input(s): TROPONINI in the last 168 hours. No results for input(s): TROPIPOC in the last 168 hours.  Chemistry Recent Labs  Lab 01/18/21 2141 01/19/21 0020 01/20/21 0340 01/21/21 0420  NA  --  138 136 133*  K  --  3.8 3.8 3.5  CL  --  105 105 101  CO2  --  24 22 20*  GLUCOSE  --  98 111* 109*  BUN  --  _0 CREATININE  --  0.72 0.67 0.83  CALCIUM  --  8.9 8.8* 8.9  PROT 6.9  --   --   --   ALBUMIN 3.8  --   --   --   AST 20  --   --   --   ALT 12  --   --   --   ALKPHOS 69  --   --   --   BILITOT 0.8   --   --   --   GFRNONAA  --  >60 >60 >60  ANIONGAP  --  _1 Hematology Recent Labs  Lab 01/19/21 0020 01/20/21 0340 01/21/21 0420  WBC 10.5 8.7 11.8*  RBC 4.67 4.92 5.16*  HGB 13.7 14.7 15.3*  HCT 41.2 43.7 44.9  MCV 88.2 88.8 87.0  MCH 29.3 29.9 29.7  MCHC 33.3 33.6 34.1  RDW 12.6 12.5 12.6  PLT 270 252 263   BNP Recent Labs  Lab 01/18/21 2141  BNP 227.3*    DDimer  Recent Labs  Lab 01/18/21 1625  DDIMER 0.48     Radiology  CARDIAC CATHETERIZATION  Result Date: 01/20/2021 Images from the original result were not included.   Mid LAD lesion is 99% stenosed.   A drug-eluting stent was successfully placed using a [place] 2.25X15.   Post intervention, there is a 0% residual stenosis.   There is severe left ventricular systolic dysfunction.   LV end diastolic pressure is mildly elevated.   The left ventricular ejection fraction is 25-35% by visual estimate. Wanda Wood is a 50 y.o. female  [id number] LOCATION:  FACILITY: [facility] PHYSICIAN: Quay Burow, M.D. 1970-02-14 DATE OF PROCEDURE:  01/20/2021 DATE OF DISCHARGE: CARDIAC CATHETERIZATION / PCI DES LAD History obtained from chart review.Wanda Wood is a 50 y.o. 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  Her troponins peaked at about thousand.  She had diffuse anterolateral T wave inversion.  She presents now for diagnostic coronary angiography. PROCEDURE DESCRIPTION: The patient was brought to the second floor  Cardiac cath lab in the postabsorptive state.  She was premedicated with IV Versed and fentanyl.  Her right wrist and groin Were prepped and shaved in usual sterile fashion. Xylocaine 1% was used for local anesthesia.  I attempted to access the right radial artery under ultrasound guidance unsuccessfully and therefore switched to the femoral approach.  A 5 French sheath was inserted into the right common femoral artery using  standard Seldinger technique.  5 French right left second sinus of catheters on the 5 French pigtail catheter used for selective coronary angiography, subselective left internal mammary artery angiography and  left ventriculography.  Isovue dye is used for the entirety of the case (180 cc total to patient).  Retrograde aortic, left ventricular and pullback pressures were recorded. The culprit vessel was the proximal/mid LAD.  The patient received 180 mg of Brilinta p.o. followed by 7500 units of heparin with an ACT of 293.  Isovue dye is used for the entirety of the intervention.  Retroaortic pressures monitored during the case. Using a 6 [place] XB LAD 3.0 cm guide catheter along with a 0.14 [place] guidewire and a 2 mm x 12 balloon the LAD was crossed with little difficulty and the lesion was predilated.  I then carefully placed a 2.25 x 15 mm long Medtronic frontier drug-eluting stent across the lesion and crossing a small diagonal branch deployed at 14 atm.  The patient did develop chest pain on initial balloon dilatation.  She received 200 mcg of intracoronary nitroglycerin because of "slow flow".  There was also some "visible thrombus" which was intraluminal just beyond the lesion.  I postdilated the stent with a 2.5 x 12 mm long noncompliant balloon at 14 atm (2.6 mm) resulting reduction of a 99% ulcerated/thrombotic proximal to mid LAD lesion to 0% residual TIMI-3 flow.  The patient tolerated the procedure well.  Her chest pain improved at the end of the case.  The guidewire and catheter were removed.  The sheath was sewn securely in place.   Ms.Tasso was admitted with chest pain and a non-STEMI with troponins in the thousand range.  She had diffuse anterolateral T wave inversion.  The "culprit lesion" was a proximal to mid ulcerated 99% LAD lesion.  She also had a high diagonal branch lesion in the 75% range but this was a small vessel.  I placed a 2.25 x 15 mm long Medtronic frontier drug-eluting stent  postdilated 2.6 mm.  Her EF was in the 25% range with anterolateral and/anteroapical wall motion abnormality.  I suspect some of this is "stunned".  She will need DAPT uninterrupted for at least 12 months, guideline directed optimal medical therapy for her LV dysfunction.  Because this was an LAD lesion she may benefit from placement of a LifeVest with reassessment of LV function in 3 months.  She left the lab in stable condition. Quay Burow. MD, [facility] 01/20/2021 11:48 AM     Cardiac Studies  LHC 01/20/2021   Mid LAD lesion is 99% stenosed.   A drug-eluting stent was successfully placed using a [place] 2.25X15.   Post intervention, there is a 0% residual stenosis.   There is severe left ventricular systolic dysfunction.   LV end diastolic pressure is mildly elevated.   The left ventricular ejection fraction is 25-35% by visual estimate.  Patient Profile  Kenidi Elenbaas is a 50 y.o. 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  Assessment & Plan   #Non-STEMI -Admitted with non-STEMI.  Found to have severe proximal to mid LAD lesion.  Status post PCI. -Echo is pending but EF by ventriculogram was 25-30%. -She was volume up with LVEDP 20.  Given Lasix yesterday. -Continue aspirin 81 mg daily and ticagrelor 90 mg twice daily. -On Crestor and Zetia.  #Hypotension #Systolic heart failure, EF 25-35% -She is hypotensive after heart cath.  Right femoral cath site is clean and dry.  No evidence of bruising or bleeding.  Her CBC is normal.  I do not suspect retroperitoneal bleed.  She describes no back pain. -Her EF was 25-35%  on ventriculogram.  Echo is pending.  I hear no murmurs to suggest mechanical complication of MI we will expedite her echo. -I will add digoxin 0.125 mg daily.  We will hold beta-blocker for now.  Hold other guideline directed medical therapy.  We will discuss her case with advanced heart failure.  I  do have concerns given her LV dysfunction.  We will make sure there are no mechanical complications. -Close monitoring.  She may need a right heart cath.  We will see how she does.  Echo will drive what we do.  Again she is warm on exam.  She is not wet.  We will just continue with digoxin for now.  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  LDL was 420 on admission. -She is okay to be on Crestor 40 mg daily and Zetia.  She did not tolerate Lipitor in the past.  For questions or updates, please contact [place] Please consult [email or web address] for contact info under   Time Spent with Patient: I have spent a total of 35 minutes with patient reviewing hospital notes, telemetry, EKGs, labs and examining the patient as well as establishing an assessment and plan that was discussed with the patient.  > 50% of time was spent in direct patient care.    Signed, Addison Naegeli. Audie Box, MD, [place]  01/21/2021 9:11 AM

## 2021-01-21 NOTE — Progress Notes (Incomplete)
°  Echocardiogram 2D Echocardiogram has been performed.  [name] 01/21/2021, 11:57 AM

## 2021-01-22 ENCOUNTER — Other Ambulatory Visit (HOSPITAL_COMMUNITY): Payer: Self-pay

## 2021-01-22 ENCOUNTER — Encounter (HOSPITAL_COMMUNITY): Payer: Self-pay | Admitting: Cardiovascular Disease

## 2021-01-22 DIAGNOSIS — I2510 Atherosclerotic heart disease of native coronary artery without angina pectoris: Secondary | ICD-10-CM

## 2021-01-22 DIAGNOSIS — I255 Ischemic cardiomyopathy: Secondary | ICD-10-CM

## 2021-01-22 LAB — CBC
HCT: 45.6 % (ref 36.0–46.0)
Hemoglobin: 15.3 g/dL — ABNORMAL HIGH (ref 12.0–15.0)
MCH: 29.6 pg (ref 26.0–34.0)
MCHC: 33.6 g/dL (ref 30.0–36.0)
MCV: 88.2 fL (ref 80.0–100.0)
Platelets: 264 10*3/uL (ref 150–400)
RBC: 5.17 MIL/uL — ABNORMAL HIGH (ref 3.87–5.11)
RDW: 12.6 % (ref 11.5–15.5)
WBC: 9.8 10*3/uL (ref 4.0–10.5)
nRBC: 0 % (ref 0.0–0.2)

## 2021-01-22 LAB — BASIC METABOLIC PANEL
Anion gap: 8 (ref 5–15)
BUN: 13 mg/dL (ref 6–20)
CO2: 23 mmol/L (ref 22–32)
Calcium: 9.4 mg/dL (ref 8.9–10.3)
Chloride: 104 mmol/L (ref 98–111)
Creatinine, Ser: 0.95 mg/dL (ref 0.44–1.00)
GFR, Estimated: >60 mL/min (ref >60)
Glucose, Bld: 105 mg/dL — ABNORMAL HIGH (ref 70–99)
Potassium: 4.4 mmol/L (ref 3.5–5.1)
Sodium: 135 mmol/L (ref 135–145)

## 2021-01-22 MED ORDER — CARVEDILOL 3.125 MG PO TABS
3.1250 mg | ORAL_TABLET | Freq: Two times a day (BID) | ORAL | Status: DC
  Administered 2021-01-22: 09:00:00 3.125 mg via ORAL
  Filled 2021-01-22: qty 1

## 2021-01-22 MED ORDER — CARVEDILOL 3.125 MG PO TABS
3.1250 mg | ORAL_TABLET | Freq: Two times a day (BID) | ORAL | 1 refills | Status: DC
  Filled 2021-01-22: qty 60, 30d supply, fill #0

## 2021-01-22 MED ORDER — DIGOXIN 125 MCG PO TABS
0.1250 mg | ORAL_TABLET | Freq: Every day | ORAL | 1 refills | Status: DC
  Filled 2021-01-22: qty 30, 30d supply, fill #0

## 2021-01-22 MED ORDER — DAPAGLIFLOZIN PROPANEDIOL 10 MG PO TABS
10.0000 mg | ORAL_TABLET | Freq: Every day | ORAL | 1 refills | Status: DC
  Filled 2021-01-22: qty 30, 30d supply, fill #0

## 2021-01-22 MED ORDER — ROSUVASTATIN CALCIUM 40 MG PO TABS
40.0000 mg | ORAL_TABLET | Freq: Every day | ORAL | 1 refills | Status: DC
  Filled 2021-01-22: qty 30, 30d supply, fill #0

## 2021-01-22 MED ORDER — EZETIMIBE 10 MG PO TABS
10.0000 mg | ORAL_TABLET | Freq: Every day | ORAL | 1 refills | Status: DC
  Filled 2021-01-22: qty 30, 30d supply, fill #0

## 2021-01-22 MED ORDER — ASPIRIN 81 MG PO CHEW
81.0000 mg | CHEWABLE_TABLET | Freq: Every day | ORAL | 2 refills | Status: DC
  Filled 2021-01-22: qty 90, 90d supply, fill #0

## 2021-01-22 MED ORDER — FUROSEMIDE 20 MG PO TABS
20.0000 mg | ORAL_TABLET | Freq: Every day | ORAL | 1 refills | Status: DC | PRN
  Filled 2021-01-22: qty 30, 30d supply, fill #0

## 2021-01-22 MED ORDER — NITROGLYCERIN 0.4 MG SL SUBL
0.4000 mg | SUBLINGUAL_TABLET | SUBLINGUAL | 2 refills | Status: DC | PRN
Start: 2021-01-22 — End: 2021-03-21
  Filled 2021-01-22: qty 25, 7d supply, fill #0

## 2021-01-22 MED ORDER — TICAGRELOR 90 MG PO TABS
90.0000 mg | ORAL_TABLET | Freq: Two times a day (BID) | ORAL | 2 refills | Status: DC
  Filled 2021-01-22: qty 60, 30d supply, fill #0

## 2021-01-22 NOTE — Progress Notes (Signed)
CARDIAC REHAB PHASE I   PRE:  Rate/Rhythm: 100 ST in bed    BP: sitting 90/56    SaO2:   MODE:  Ambulation: 400 ft   POST:  Rate/Rhythm: 130 ST getting dressed, 127 ST end of walk    BP: sitting 100/70     SaO2:   HR up with mobility but no dizziness. Just felt exerted after walk. To recliner, BP stable. Reviewed education and discussed exercise at home and NTG. Pt receptive.  [id number]   Harriet Masson CES, ACSM 01/22/2021 9:30 AM

## 2021-01-22 NOTE — Progress Notes (Signed)
Heart Failure Nurse Navigator Progress Note  PCP: Pcp, No PCP-Cardiologist: Flora Lipps, W., MD (NEW) Admission Diagnosis: NSTEMI Admitted from: home with mother  Presentation:   Wanda Wood presented 12/10 with chest pressure, noted NSTEMI. Pt underwent LHC, received PCI with DES. Explained importance of taking and continuing Brilinta. Pt sitting in recliner with legs down on room air. Patient interactive with interview process.  Pt states she lives with her mother, does help with grocery bills, but does not pay rent/mortgage. States she would like to get her own place but finances are restricting as she works part time as a Event organiser. She does not have insurance at this time. States she did not have any daily medications prior to admission--only taking estrogen patch and progesterone.  Pt states she drives a reliable vehicle when she is cleared to drive again (states 1 week per Dr. Flora Lipps). Endorses marijuana use 4xweek to help with anxiety, plans to stop but questioning edibles. Rarely drinks alcohol; smoked from age [age], less than 1 PPD.  Explained benefits of Heart & Vascular Transitions of Care Clinic appointment, patient agreeable.   ECHO/ LVEF: 35-40%, G1DD  Clinical Course:  Past Medical History:  Diagnosis Date   High cholesterol      Social History   Socioeconomic History   Marital status: Single    Spouse name: Not on file   Number of children: 0   Years of education: Not on file   Highest education level: Not on file  Occupational History   Occupation: bartender/waitress    Comment: part time  Tobacco Use   Smoking status: Former    Packs/day: 0.50    Years: 3.00    Pack years: 1.50    Types: Cigarettes   Smokeless tobacco: Never  Vaping Use   Vaping Use: Never used  Substance and Sexual Activity   Alcohol use: Not Currently   Drug use: Yes    Frequency: 4.0 times per week    Types: Marijuana   Sexual activity: Not on file  Other Topics Concern    Not on file  Social History Narrative   Not on file   Social Determinants of Health   Financial Resource Strain: High Risk   Difficulty of Paying Living Expenses: Hard  Food Insecurity: No Food Insecurity   Worried About Running Out of Food in the Last Year: Never true   Ran Out of Food in the Last Year: Never true  Transportation Needs: No Transportation Needs   Lack of Transportation (Medical): No   Lack of Transportation (Non-Medical): No  Physical Activity: Not on file  Stress: Not on file  Social Connections: Not on file    High Risk Criteria for Readmission and/or Poor Patient Outcomes: Heart failure hospital admissions (last 6 months): 1  No Show rate: NA Difficult social situation: yes Demonstrates medication adherence: NA Primary Language: English Literacy level: Able to read/write and comprehend. Wears glasses.   Education Assessment and Provision:  Detailed education and instructions provided on heart failure disease management including the following:  Signs and symptoms of Heart Failure When to call the physician Importance of daily weights Low sodium diet Fluid restriction Medication management Anticipated future follow-up appointments  Patient education given on each of the above topics.  Patient acknowledges understanding via teach back method and acceptance of all instructions.  Education Materials:  "Living Better With Heart Failure" Booklet, HF zone tool, & Daily Weight Tracker Tool.  Patient has scale at home: yes Patient has pill box at  home: yes   Barriers of Care:   -new HF dx -new med regimen -financial strain -no insurance -no PCP -no cardiology  Considerations/Referrals:   Referral made to Heart Failure Pharmacist Stewardship: yes, appreciated  Referral made to Heart Failure CSW/NCM TOC: yes, to see at [facility]  Referral made to Heart & Vascular TOC clinic: yes, 12/27 @ 1pm  Items for Follow-up on DC/TOC: -optimize (BP  limiting) -med cost assistance -financial strain -cont HF education -insurance   Ozella Rocks, MSN, Health visitor [phone]

## 2021-01-22 NOTE — Discharge Summary (Signed)
Discharge Summary    Patient ID: Wanda Wood MRN: [id number]; DOB: April 08, 1970  Admit date: 01/18/2021 Discharge date: 01/22/2021  PCP:  Pcp, No   [facility] Providers Cardiologist:  Evalina Field, MD     Discharge Diagnoses    Principal Problem:   NSTEMI (non-ST elevated myocardial infarction) [facility]) Active Problems:   Hyperlipidemia   Hypotension   Ischemic cardiomyopathy   CAD (coronary artery disease)    Diagnostic Studies/Procedures    Cath: 01/20/21    Mid LAD lesion is 99% stenosed.   A drug-eluting stent was successfully placed using a [place] 2.25X15.   Post intervention, there is a 0% residual stenosis.   There is severe left ventricular systolic dysfunction.   LV end diastolic pressure is mildly elevated.   The left ventricular ejection fraction is 25-35% by visual estimate.   IMPRESSION: WandaWood was admitted with chest pain and a non-STEMI with troponins in the thousand range.  She had diffuse anterolateral T wave inversion.  The "culprit lesion" was a proximal to mid ulcerated 99% LAD lesion.  She also had a high diagonal branch lesion in the 75% range but this was a small vessel.  I placed a 2.25 x 15 mm long Medtronic frontier drug-eluting stent postdilated 2.6 mm.  Her EF was in the 25% range with anterolateral and/anteroapical wall motion abnormality.  I suspect some of this is "stunned".  She will need DAPT uninterrupted for at least 12 months, guideline directed optimal medical therapy for her LV dysfunction.  Because this was an LAD lesion she may benefit from placement of a LifeVest with reassessment of LV function in 3 months.  She left the lab in stable condition.   Wanda Wood. MD, [facility] 01/20/2021  Diagnostic Dominance: Right Intervention    Echo: 01/21/21  IMPRESSIONS     1. Left ventricular ejection fraction, by estimation, is 35 to 40%. Left  ventricular ejection fraction by 3D volume is 44 %. The left  ventricle has  moderately decreased function. The left ventricle demonstrates regional  wall motion abnormalities (see  scoring diagram/findings for description). Left ventricular diastolic  parameters are consistent with Grade I diastolic dysfunction (impaired  relaxation). There is severe hypokinesis of the left ventricular,  mid-apical anteroseptal wall, anterior wall and   apical segment. No left ventricular thrombus is seen (Definity was used).   2. Right ventricular systolic function is normal. The right ventricular  size is normal. There is normal pulmonary artery systolic pressure.   3. A small pericardial effusion is present. The pericardial effusion is  localized near the right ventricle.   4. The mitral valve is normal in structure. No evidence of mitral valve  regurgitation.   5. The aortic valve is tricuspid. Aortic valve regurgitation is not  visualized. No aortic stenosis is present.   FINDINGS   Left Ventricle: Left ventricular ejection fraction, by estimation, is 35  to 40%. Left ventricular ejection fraction by 3D volume is 44 %. The left  ventricle has moderately decreased function. The left ventricle  demonstrates regional wall motion  abnormalities. Severe hypokinesis of the left ventricular, mid-apical  anteroseptal wall, anterior wall and apical segment. Definity contrast  agent was given IV to delineate the left ventricular endocardial borders.  The left ventricular internal cavity  size was normal in size. There is no left ventricular hypertrophy. Left  ventricular diastolic parameters are consistent with Grade I diastolic  dysfunction (impaired relaxation). Normal left ventricular filling  pressure.  LV Wall Scoring:  The mid and distal anterior wall, mid and distal anterior septum, apical  lateral segment, and apex are hypokinetic. No left ventricular thrombus is  seen (Definity was used).   Right Ventricle: The right ventricular size is normal. No  increase in  right ventricular wall thickness. Right ventricular systolic function is  normal. There is normal pulmonary artery systolic pressure. The tricuspid  regurgitant velocity is 2.25 m/s, and   with an assumed right atrial pressure of 3 mmHg, the estimated right  ventricular systolic pressure is 17.4 mmHg.   Left Atrium: Left atrial size was normal in size.   Right Atrium: Right atrial size was normal in size.   Pericardium: A small pericardial effusion is present. The pericardial  effusion is localized near the right ventricle.   Mitral Valve: The mitral valve is normal in structure. No evidence of  mitral valve regurgitation.   Tricuspid Valve: The tricuspid valve is normal in structure. Tricuspid  valve regurgitation is mild.   Aortic Valve: The aortic valve is tricuspid. Aortic valve regurgitation is  not visualized. No aortic stenosis is present.   Pulmonic Valve: The pulmonic valve was grossly normal. Pulmonic valve  regurgitation is not visualized.   Aorta: The aortic root is normal in size and structure.   IAS/Shunts: No atrial level shunt detected by color flow Doppler.   _____________   History of Present Illness     Wanda Wood is a 50 y.o. female with HLD who presented to the ED with chest pain and found to have NSTEMI.   Wanda Wood reported intermittent chest tightness over the past 6 months with significant emotional stress which self-resolved. The day of admission she developed resting chest pain not related to emotion with associated dyspnea and feeling cool and clammy with radiation to the left shoulder. She has chronic neck/shoulder pain but this pain is grabbing, dull, not radiating from the upper neck like the previous pain was.  Last lipid panel obtained was last year, she reported having total cholesterol over 500, LDL over 400. The last lipid panel in care everywhere was from 2014; she had tcholesterol 502 but LDLc of 24. Previously on  lipitor but poorly tolerated due to gi upset so discontinued, not on any lipid suppressive agents Here normal vitals. Labs notable for troponins 400s -> 1900s. Ddimer negative. Normal hgb. She was given aspirin and started on heparin gtt. Admitted to cardiology  Hospital Course     NSTEMI: High-sensitivity troponin peaked at 1936.  She underwent cardiac catheterization noted above with 99% mid LAD ulcerated lesion treated with PCI/DES x1.  Also with diagonal branch lesion of 75% but noted to be small caliber vessel.  Recommendations for DAPT with aspirin/Brilinta for at least 1 year.  No recurrent chest pain.  Evaluated/ambulated by cardiac rehab without recurrent chest pain.   HFrEF/ICM: Echo noted LVEF of 35 to 08%, grade 1 diastolic dysfunction, severe hypokinesis of the LV, mid-apical anterior septal wall, normal RV.  No LV thrombus noted.  Blood pressures remained low throughout admission.  Difficult to add GDMT. --Tolerated the addition of digoxin 0.125 mg daily as well as low-dose Coreg 3.125 mg twice daily. --Added Farxiga 10 mg daily at discharge --Seen by the [facility] heart failure navigator with plans to follow-up in the clinic.  Consider addition of ARB/+ Arlyce Harman, possible transition to [facility] pending patient's BP at follow-up. -- lasix 7m daily PRN at discharge   HLD: LDL 420 on admission!!! -- Reports being  unable to tolerate Lipitor in the past but agreeable to Crestor 40 mg daily as well as Zetia. 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 Marisue Ivan and deemed stable for discharge home. Follow up in the office has been arranged (TOC CHF clinic as well as general cardiology). Medications sent to the [facility] pharmacy. Educated by pharmD prior to discharge.   Did the patient have an acute coronary syndrome (MI, NSTEMI, STEMI, etc) this admission?:  Yes                               AHA/ACC Clinical Performance & Quality  Measures: Aspirin prescribed? - Yes ADP Receptor Inhibitor (Plavix/Clopidogrel, Brilinta/Ticagrelor or Effient/Prasugrel) prescribed (includes medically managed patients)? - Yes Beta Blocker prescribed? - Yes High Intensity Statin (Lipitor 40-39m or Crestor 20-433m prescribed? - Yes EF assessed during THIS hospitalization? - Yes For EF <40%, was ACEI/ARB prescribed? - No - Reason:  low BP For EF <40%, Aldosterone Antagonist (Spironolactone or Eplerenone) prescribed? - No - Reason:  low BP Cardiac Rehab Phase II ordered (including medically managed patients)? - Yes   The patient will be scheduled for a TOC follow up appointment in 10-14 days.  [facility] at the office where the patient should be seen for follow up.  _____________  Discharge Vitals Blood pressure (!) 90/56, pulse 96, temperature 98.8 F (37.1 C), temperature source Oral, resp. rate 20, height 5' 4"  (1.626 m), weight 67.9 kg, SpO2 98 %.  Filed Weights   01/20/21 0407 01/21/21 0426 01/22/21 0417  Weight: 68.6 kg 67.9 kg 67.9 kg    Labs & Radiologic Studies    CBC Recent Labs    01/21/21 0420 01/22/21 0318  WBC 11.8* 9.8  HGB 15.3* 15.3*  HCT 44.9 45.6  MCV 87.0 88.2  PLT 263 [id number] Basic Metabolic Panel Recent Labs    01/21/21 0420 01/22/21 0318  NA 133* 135  K 3.5 4.4  CL 101 104  CO2 20* 23  GLUCOSE 109* 105*  BUN 6 13  CREATININE 0.83 0.95  CALCIUM 8.9 9.4   Liver Function Tests No results for input(s): AST, ALT, ALKPHOS, BILITOT, PROT, ALBUMIN in the last 72 hours. No results for input(s): LIPASE, AMYLASE in the last 72 hours. High Sensitivity Troponin:   Recent Labs  Lab 01/18/21 1349 01/18/21 1549 01/18/21 2141 01/19/21 0259  TROPONINIHS 485* 1,936* 1,749* 1,006*    BNP Invalid input(s): POCBNP D-Dimer No results for input(s): DDIMER in the last 72 hours. Hemoglobin A1C No results for input(s): HGBA1C in the last 72 hours. Fasting  Lipid Panel No results for input(s): CHOL, HDL, LDLCALC, TRIG, CHOLHDL, LDLDIRECT in the last 72 hours. Thyroid Function Tests No results for input(s): TSH, T4TOTAL, T3FREE, THYROIDAB in the last 72 hours.  Invalid input(s): FREET3 _____________  DG Chest 2 View  Result Date: 01/18/2021 CLINICAL DATA:  Chest pain EXAM: CHEST - 2 VIEW COMPARISON:  None. FINDINGS: Heart size and mediastinal contours are within normal limits. No suspicious pulmonary opacities identified. No pleural effusion or pneumothorax visualized. No acute osseous abnormality appreciated. IMPRESSION: No acute intrathoracic process identified. Electronically Signed   By: DeOfilia Neas.D.   On: 01/18/2021 14:33   CARDIAC CATHETERIZATION  Result Date: 01/20/2021 Images from the original result were not included.   Mid LAD lesion is 99% stenosed.  A drug-eluting stent was successfully placed using a [place] 2.25X15.   Post intervention, there is a 0% residual stenosis.   There is severe left ventricular systolic dysfunction.   LV end diastolic pressure is mildly elevated.   The left ventricular ejection fraction is 25-35% by visual estimate. Millicent Blazejewski is a 50 y.o. female  [id number] LOCATION:  FACILITY: [facility] PHYSICIAN: Wanda Wood, M.D. May 13, 1970 DATE OF PROCEDURE:  01/20/2021 DATE OF DISCHARGE: CARDIAC CATHETERIZATION / PCI DES LAD History obtained from chart review.Lyrika Souders is a 50 y.o. 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  Her troponins peaked at about thousand.  She had diffuse anterolateral T wave inversion.  She presents now for diagnostic coronary angiography. PROCEDURE DESCRIPTION: The patient was brought to the second floor [facility] cath lab in the postabsorptive state.  She was premedicated with IV Versed and fentanyl.  Her right wrist and groin Were prepped and shaved in usual sterile fashion. Xylocaine 1% was used  for local anesthesia.  I attempted to access the right radial artery under ultrasound guidance unsuccessfully and therefore switched to the femoral approach.  A 5 French sheath was inserted into the right common femoral artery using standard Seldinger technique.  5 French right left second sinus of catheters on the 5 French pigtail catheter used for selective coronary angiography, subselective left internal mammary artery angiography and left ventriculography.  Isovue dye is used for the entirety of the case (180 cc total to patient).  Retrograde aortic, left ventricular and pullback pressures were recorded. The culprit vessel was the proximal/mid LAD.  The patient received 180 mg of Brilinta p.o. followed by 7500 units of heparin with an ACT of 293.  Isovue dye is used for the entirety of the intervention.  Retroaortic pressures monitored during the case. Using a 6 [place] XB LAD 3.0 cm guide catheter along with a 0.14 [place] guidewire and a 2 mm x 12 balloon the LAD was crossed with little difficulty and the lesion was predilated.  I then carefully placed a 2.25 x 15 mm long Medtronic frontier drug-eluting stent across the lesion and crossing a small diagonal branch deployed at 14 atm.  The patient did develop chest pain on initial balloon dilatation.  She received 200 mcg of intracoronary nitroglycerin because of "slow flow".  There was also some "visible thrombus" which was intraluminal just beyond the lesion.  I postdilated the stent with a 2.5 x 12 mm long noncompliant balloon at 14 atm (2.6 mm) resulting reduction of a 99% ulcerated/thrombotic proximal to mid LAD lesion to 0% residual TIMI-3 flow.  The patient tolerated the procedure well.  Her chest pain improved at the end of the case.  The guidewire and catheter were removed.  The sheath was sewn securely in place.   Wanda Wood was admitted with chest pain and a non-STEMI with troponins in the thousand range.  She had diffuse anterolateral T wave  inversion.  The "culprit lesion" was a proximal to mid ulcerated 99% LAD lesion.  She also had a high diagonal branch lesion in the 75% range but this was a small vessel.  I placed a 2.25 x 15 mm long Medtronic frontier drug-eluting stent postdilated 2.6 mm.  Her EF was in the 25% range with anterolateral and/anteroapical wall motion abnormality.  I suspect some of this is "stunned".  She will need DAPT uninterrupted for at least 12 months, guideline directed optimal medical therapy for her  LV dysfunction.  Because this was an LAD lesion she may benefit from placement of a LifeVest with reassessment of LV function in 3 months.  She left the lab in stable condition. Wanda Wood. MD, [facility] 01/20/2021 11:48 AM    ECHOCARDIOGRAM COMPLETE  Result Date: 01/21/2021    ECHOCARDIOGRAM REPORT   Patient Name:   Wanda Wood Date of Exam: 01/21/2021 Medical Rec #:  [id number]       Height:       64.0 in Accession #:    [id number]      Weight:       149.6 lb Date of Birth:  09-Feb-1971        BSA:          1.729 m Patient Age:    46 years        BP:           104/64 mmHg Patient Gender: F               HR:           91 bpm. Exam Location:  Inpatient Procedure: 2D Echo, Cardiac Doppler, Color Doppler and Intracardiac            Opacification Agent Indications:    NSTEMI I21.4  History:        Patient has no prior history of Echocardiogram examinations.  Sonographer:    Bernadene Person RDCS Referring Phys: [id number] [place] IMPRESSIONS  1. Left ventricular ejection fraction, by estimation, is 35 to 40%. Left ventricular ejection fraction by 3D volume is 44 %. The left ventricle has moderately decreased function. The left ventricle demonstrates regional wall motion abnormalities (see scoring diagram/findings for description). Left ventricular diastolic parameters are consistent with Grade I diastolic dysfunction (impaired relaxation). There is severe hypokinesis of the left ventricular, mid-apical anteroseptal wall,  anterior wall and  apical segment. No left ventricular thrombus is seen (Definity was used).  2. Right ventricular systolic function is normal. The right ventricular size is normal. There is normal pulmonary artery systolic pressure.  3. A small pericardial effusion is present. The pericardial effusion is localized near the right ventricle.  4. The mitral valve is normal in structure. No evidence of mitral valve regurgitation.  5. The aortic valve is tricuspid. Aortic valve regurgitation is not visualized. No aortic stenosis is present. FINDINGS  Left Ventricle: Left ventricular ejection fraction, by estimation, is 35 to 40%. Left ventricular ejection fraction by 3D volume is 44 %. The left ventricle has moderately decreased function. The left ventricle demonstrates regional wall motion abnormalities. Severe hypokinesis of the left ventricular, mid-apical anteroseptal wall, anterior wall and apical segment. Definity contrast agent was given IV to delineate the left ventricular endocardial borders. The left ventricular internal cavity size was normal in size. There is no left ventricular hypertrophy. Left ventricular diastolic parameters are consistent with Grade I diastolic dysfunction (impaired relaxation). Normal left ventricular filling pressure.  LV Wall Scoring: The mid and distal anterior wall, mid and distal anterior septum, apical lateral segment, and apex are hypokinetic. No left ventricular thrombus is seen (Definity was used). Right Ventricle: The right ventricular size is normal. No increase in right ventricular wall thickness. Right ventricular systolic function is normal. There is normal pulmonary artery systolic pressure. The tricuspid regurgitant velocity is 2.25 m/s, and  with an assumed right atrial pressure of 3 mmHg, the estimated right ventricular systolic pressure is 78.2 mmHg. Left Atrium: Left atrial size was normal in size. Right Atrium:  Right atrial size was normal in size. Pericardium: A  small pericardial effusion is present. The pericardial effusion is localized near the right ventricle. Mitral Valve: The mitral valve is normal in structure. No evidence of mitral valve regurgitation. Tricuspid Valve: The tricuspid valve is normal in structure. Tricuspid valve regurgitation is mild. Aortic Valve: The aortic valve is tricuspid. Aortic valve regurgitation is not visualized. No aortic stenosis is present. Pulmonic Valve: The pulmonic valve was grossly normal. Pulmonic valve regurgitation is not visualized. Aorta: The aortic root is normal in size and structure. IAS/Shunts: No atrial level shunt detected by color flow Doppler.  LEFT VENTRICLE PLAX 2D LVIDd:         3.80 cm         Diastology LVIDs:         3.10 cm         LV e' medial:    5.43 cm/s LV PW:         1.10 cm         LV E/e' medial:  7.0 LV IVS:        0.70 cm         LV e' lateral:   6.32 cm/s LVOT diam:     1.80 cm         LV E/e' lateral: 6.0 LV SV:         37 LV SV Index:   22 LVOT Area:     2.54 cm        3D Volume EF                                LV 3D EF:    Left                                             ventricul LV Volumes (MOD)                            ar LV vol d, MOD    78.9 ml                    ejection A2C:                                        fraction LV vol d, MOD    74.6 ml                    by 3D A4C:                                        volume is LV vol s, MOD    49.8 ml                    44 %. A2C: LV vol s, MOD    45.6 ml A4C:                           3D Volume EF: LV SV MOD A2C:   29.1 ml  3D EF:        44 % LV SV MOD A4C:   74.6 ml       LV EDV:       147 ml LV SV MOD BP:    30.6 ml       LV ESV:       82 ml                                LV SV:        65 ml RIGHT VENTRICLE RV S prime:     15.00 cm/s TAPSE (M-mode): 1.5 cm LEFT ATRIUM             Index        RIGHT ATRIUM          Index LA diam:        1.80 cm 1.04 cm/m   RA Area:     9.43 cm LA Vol (A2C):   27.3 ml 15.79 ml/m  RA Volume:   19.40  ml 11.22 ml/m LA Vol (A4C):   18.7 ml 10.81 ml/m LA Biplane Vol: 24.0 ml 13.88 ml/m  AORTIC VALVE LVOT Vmax:   83.60 cm/s LVOT Vmean:  67.600 cm/s LVOT VTI:    0.147 m  AORTA Ao Root diam: 2.40 cm Ao Asc diam:  2.60 cm MITRAL VALVE               TRICUSPID VALVE MV Area (PHT): 4.80 cm    TR Peak grad:   20.2 mmHg MV Decel Time: 158 msec    TR Vmax:        225.00 cm/s MV E velocity: 38.00 cm/s MV A velocity: 51.80 cm/s  SHUNTS MV E/A ratio:  0.73        Systemic VTI:  0.15 m                            Systemic Diam: 1.80 cm Dani Gobble Croitoru MD Electronically signed by Sanda Klein MD Signature Date/Time: 01/21/2021/12:30:08 PM    Final    Disposition   Pt is being discharged home today in good condition.  Follow-up Plans & Appointments     Follow-up Information     [facility] Follow up on 02/18/2021.   Specialty: Family Medicine Why: @ 2:30 pm for hospital follow up appointment with Wanda Wood. If you cannot make thius scheduled appointment please call the office to reschedule. Contact information: [place] [phone] [place] [facility] Follow up.   Why: Please use this location for pharmacy assistance with medications in the community. Medications will range in cost from $4.00-$10.00. Contact information: [place] [phone] [facility] Follow up on 02/04/2021.   Specialty: Cardiology Why: at 1pm for your follow up appt Contact information: [address] [id number] [place] [phone]               Discharge Instructions     AMB Referral to Advanced Lipid Disorders Clinic   Complete by: As directed    LDL 420   Reason for referral: Patients with LDL>190 mg/dL   Internal Lipid Clinic Referral Scheduling  Internal lipid  clinic referrals are providers within [facility], who wish to refer established patients for routine management (help in starting PCSK9 inhibitor therapy) or advanced therapies.  Internal MD referral criteria:              1. All patients with LDL>190 mg/dL  2. All patients with Triglycerides >500 mg/dL  3. Patients with suspected or confirmed heterozygous familial hyperlipidemia (HeFH) or homozygous familial hyperlipidemia (HoFH)  4. Patients with family history of suspicious for genetic dyslipidemia desiring genetic testing  5. Patients refractory to standard guideline based therapy  6. Patients with statin intolerance (failed 2 statins, one of which must be a high potency statin)  7. Patients who the provider desires to be seen by MD   Internal PharmD referral criteria:   1. Follow-up patients for medication management  2. Follow-up for compliance monitoring  3. Patients for drug education  4. Patients with statin intolerance  5. PCSK9 inhibitor education and prior authorization approvals  6. Patients with triglycerides <500 mg/dL  External Lipid Clinic Referral  External lipid clinic referrals are for providers outside of [facility], considered new clinic patients - automatically routed to MD schedule   Amb Referral to Cardiac Rehabilitation   Complete by: As directed    Diagnosis:  NSTEMI PTCA Coronary Stents     After initial evaluation and assessments completed: Virtual Based Care may be provided alone or in conjunction with Phase 2 Cardiac Rehab based on patient barriers.: Yes   Diet - low sodium heart healthy   Complete by: As directed    Discharge instructions   Complete by: As directed    Radial Site Care Refer to this sheet in the next few weeks. These instructions provide you with information on caring for yourself after your procedure. Your caregiver may also give you more specific instructions. Your treatment has been planned according to current medical practices,  but problems sometimes occur. Call your caregiver if you have any problems or questions after your procedure. HOME CARE INSTRUCTIONS You may shower the day after the procedure. Remove the bandage (dressing) and gently wash the site with plain soap and water. Gently pat the site dry.  Do not apply powder or lotion to the site.  Do not submerge the affected site in water for 3 to 5 days.  Inspect the site at least twice daily.  Do not flex or bend the affected arm for 24 hours.  No lifting over 5 pounds (2.3 kg) for 5 days after your procedure.  Do not drive home if you are discharged the same day of the procedure. Have someone else drive you.  You may drive 24 hours after the procedure unless otherwise instructed by your caregiver.  What to expect: Any bruising will usually fade within 1 to 2 weeks.  Blood that collects in the tissue (hematoma) may be painful to the touch. It should usually decrease in size and tenderness within 1 to 2 weeks.  SEEK IMMEDIATE MEDICAL CARE IF: You have unusual pain at the radial site.  You have redness, warmth, swelling, or pain at the radial site.  You have drainage (other than a small amount of blood on the dressing).  You have chills.  You have a fever or persistent symptoms for more than 72 hours.  You have a fever and your symptoms suddenly get worse.  Your arm becomes pale, cool, tingly, or numb.  You have heavy bleeding from the site. Hold pressure on the site.   PLEASE DO  NOT MISS ANY DOSES OF YOUR BRILINTA!!!!! Also keep a log of you blood pressures and bring back to your follow up appt. Please call the office with any questions.   Patients taking blood thinners should generally stay away from medicines like ibuprofen, Advil, Motrin, naproxen, and Aleve due to risk of stomach bleeding. You may take Tylenol as directed or talk to your primary doctor about alternatives.  PLEASE ENSURE THAT YOU DO NOT RUN OUT OF YOUR BRILINTA. This medication is very  important to remain on for at least one year. IF you have issues obtaining this medication due to cost please CALL the office 3-5 business days prior to running out in order to prevent missing doses of this medication.   For patients with congestive heart failure, we give them these special instructions:  1. Follow a low-salt diet and watch your fluid intake. In general, you should not be taking in more than 2 liters of fluid per day (no more than 8 glasses per day). Some patients are restricted to less than 1.5 liters of fluid per day (no more than 6 glasses per day). This includes sources of water in foods like soup, coffee, tea, milk, etc. 2. Weigh yourself on the same scale at same time of day and keep a log. 3. Call your doctor: (Anytime you feel any of the following symptoms)  - 3-4 pound weight gain in 1-2 days or 2 pounds overnight  - Shortness of breath, with or without a dry hacking cough  - Swelling in the hands, feet or stomach  - If you have to sleep on extra pillows at night in order to breathe   IT IS IMPORTANT TO LET YOUR DOCTOR KNOW EARLY ON IF YOU ARE HAVING SYMPTOMS SO WE CAN HELP YOU!   Increase activity slowly   Complete by: As directed       Discharge Medications   Allergies as of 01/22/2021       Reactions   Lactose Intolerance (gi) Other (See Comments)   GI upset, SOB   Soy Allergy Other (See Comments)   GI upset   Sulfa Antibiotics Nausea And Vomiting        Medication List     STOP taking these medications    estradiol 0.05 mg/24hr patch Commonly known as: CLIMARA - Dosed in mg/24 hr   progesterone 100 MG capsule Commonly known as: PROMETRIUM       TAKE these medications    acetaminophen 500 MG tablet Commonly known as: TYLENOL Take 500 mg by mouth every 6 (six) hours as needed for moderate pain or mild pain.   aspirin 81 MG chewable tablet Chew 1 tablet (81 mg total) by mouth daily. Start taking on: January 23, 2021   carvedilol 3.125  MG tablet Commonly known as: COREG Take 1 tablet (3.125 mg total) by mouth 2 (two) times daily with a meal.   dapagliflozin propanediol 10 MG Tabs tablet Commonly known as: FARXIGA Take 1 tablet (10 mg total) by mouth daily. Start taking on: January 23, 2021   digoxin 0.125 MG tablet Commonly known as: LANOXIN Take 1 tablet (0.125 mg total) by mouth daily. Start taking on: January 23, 2021   ezetimibe 10 MG tablet Commonly known as: ZETIA Take 1 tablet (10 mg total) by mouth daily. Start taking on: January 23, 2021   furosemide 20 MG tablet Commonly known as: Lasix Take 1 tablet (20 mg total) by mouth daily as needed. For weight gain 3lb overnight,  5lbs in a week, shortness of breath or leg edema   nitroGLYCERIN 0.4 MG SL tablet Commonly known as: NITROSTAT Place 1 tablet (0.4 mg total) under the tongue every 5 (five) minutes x 3 doses as needed for chest pain.   rosuvastatin 40 MG tablet Commonly known as: CRESTOR Take 1 tablet (40 mg total) by mouth daily. Start taking on: January 23, 2021   ticagrelor 90 MG Tabs tablet Commonly known as: BRILINTA Take 1 tablet (90 mg total) by mouth 2 (two) times daily.        Outstanding Labs/Studies   FLP/LFTs in 8 weeks Echo 3 months  Duration of Discharge Encounter   Greater than 30 minutes including physician time.  Signed, Reino Bellis, NP 01/22/2021, 10:11 AM

## 2021-01-22 NOTE — Care Management (Signed)
1008 01-22-21 MATCH completed and medications will be delivered to the bedside before transition home. No further needs identified at this time.

## 2021-01-24 ENCOUNTER — Telehealth (HOSPITAL_COMMUNITY): Payer: Self-pay

## 2021-01-24 NOTE — Telephone Encounter (Signed)
Called patient to see if she is interested in the Cardiac Rehab Program. Patient expressed interest. Explained scheduling process and went over insurance process, patient verbalized understanding. Will contact patient for scheduling once f/u has been completed. °

## 2021-01-24 NOTE — Telephone Encounter (Signed)
Pt stated that she is signing up for insurance and should have insurance after the 1st if the new year!

## 2021-01-27 ENCOUNTER — Encounter: Payer: Self-pay | Admitting: Cardiovascular Disease

## 2021-02-04 ENCOUNTER — Encounter (HOSPITAL_COMMUNITY): Payer: Self-pay

## 2021-02-04 ENCOUNTER — Ambulatory Visit (HOSPITAL_COMMUNITY)
Admit: 2021-02-04 | Discharge: 2021-02-04 | Disposition: A | Discharge status: Home / Self Care | Payer: Self-pay | Attending: Cardiology | Admitting: Cardiology

## 2021-02-04 ENCOUNTER — Telehealth: Payer: Self-pay | Admitting: Licensed Clinical Social Worker

## 2021-02-04 ENCOUNTER — Other Ambulatory Visit: Payer: Self-pay

## 2021-02-04 ENCOUNTER — Telehealth (HOSPITAL_COMMUNITY): Payer: Self-pay

## 2021-02-04 ENCOUNTER — Telehealth (HOSPITAL_COMMUNITY): Payer: Self-pay | Admitting: Surgery

## 2021-02-04 VITALS — BP 102/62 | HR 66 | Wt 155.4 lb

## 2021-02-04 DIAGNOSIS — Z7982 Long term (current) use of aspirin: Secondary | ICD-10-CM | POA: Insufficient documentation

## 2021-02-04 DIAGNOSIS — Z955 Presence of coronary angioplasty implant and graft: Secondary | ICD-10-CM | POA: Insufficient documentation

## 2021-02-04 DIAGNOSIS — F1290 Cannabis use, unspecified, uncomplicated: Secondary | ICD-10-CM | POA: Insufficient documentation

## 2021-02-04 DIAGNOSIS — Z596 Low income: Secondary | ICD-10-CM | POA: Insufficient documentation

## 2021-02-04 DIAGNOSIS — Z7902 Long term (current) use of antithrombotics/antiplatelets: Secondary | ICD-10-CM | POA: Insufficient documentation

## 2021-02-04 DIAGNOSIS — Z7984 Long term (current) use of oral hypoglycemic drugs: Secondary | ICD-10-CM | POA: Insufficient documentation

## 2021-02-04 DIAGNOSIS — I5022 Chronic systolic (congestive) heart failure: Secondary | ICD-10-CM | POA: Insufficient documentation

## 2021-02-04 DIAGNOSIS — Z5986 Financial insecurity: Secondary | ICD-10-CM | POA: Insufficient documentation

## 2021-02-04 DIAGNOSIS — I255 Ischemic cardiomyopathy: Secondary | ICD-10-CM | POA: Insufficient documentation

## 2021-02-04 DIAGNOSIS — Z560 Unemployment, unspecified: Secondary | ICD-10-CM | POA: Insufficient documentation

## 2021-02-04 DIAGNOSIS — Z79899 Other long term (current) drug therapy: Secondary | ICD-10-CM | POA: Insufficient documentation

## 2021-02-04 DIAGNOSIS — I214 Non-ST elevation (NSTEMI) myocardial infarction: Secondary | ICD-10-CM | POA: Insufficient documentation

## 2021-02-04 DIAGNOSIS — E785 Hyperlipidemia, unspecified: Secondary | ICD-10-CM | POA: Insufficient documentation

## 2021-02-04 DIAGNOSIS — Z9861 Coronary angioplasty status: Secondary | ICD-10-CM

## 2021-02-04 DIAGNOSIS — I2510 Atherosclerotic heart disease of native coronary artery without angina pectoris: Secondary | ICD-10-CM | POA: Insufficient documentation

## 2021-02-04 DIAGNOSIS — I3139 Other pericardial effusion (noninflammatory): Secondary | ICD-10-CM | POA: Insufficient documentation

## 2021-02-04 LAB — BASIC METABOLIC PANEL
Anion gap: 7 (ref 5–15)
BUN: 11 mg/dL (ref 6–20)
CO2: 25 mmol/L (ref 22–32)
Calcium: 9.2 mg/dL (ref 8.9–10.3)
Chloride: 105 mmol/L (ref 98–111)
Creatinine, Ser: 0.83 mg/dL (ref 0.44–1.00)
GFR, Estimated: >60 mL/min (ref >60)
Glucose, Bld: 102 mg/dL — ABNORMAL HIGH (ref 70–99)
Potassium: 4 mmol/L (ref 3.5–5.1)
Sodium: 137 mmol/L (ref 135–145)

## 2021-02-04 LAB — DIGOXIN LEVEL: Digoxin Level: 0.7 ng/mL — ABNORMAL LOW (ref 0.8–2.0)

## 2021-02-04 NOTE — Addendum Note (Signed)
Encounter addended by: Allayne Butcher, PA-C on: 02/04/2021 4:26 PM  Actions taken: Clinical Note Signed

## 2021-02-04 NOTE — Patient Instructions (Addendum)
Great to see you today! Please continue to take all medications as prescribed.  Call our clinic if you are about to run out of any medications please.   Follow up with Heart Failure APP as scheduled. Follow-up with HF Pharmacist as scheduled.

## 2021-02-04 NOTE — Telephone Encounter (Signed)
-----   Message from Allayne Butcher, [place] sent at 02/04/2021  4:32 PM EST ----- Dig level borderline elevated. Reduce dose to 0.0625 mg daily. Repeat dig level in 7 days

## 2021-02-04 NOTE — Telephone Encounter (Signed)
Call attempted to confirm HV TOC appt today at 1PM. HIPPA appropriate VM left with callback number.   Ozella Rocks, MSN, RN Heart Failure Nurse Navigator [phone]

## 2021-02-04 NOTE — Progress Notes (Addendum)
HEART & VASCULAR TRANSITION OF CARE CONSULT NOTE     Referring Physician: Dr. Audie Box  Primary Care: Pcp, No Primary Cardiologist: Dr. Audie Box   HPI: Referred to clinic by Dr. Audie Box for heart failure consultation.   50 y/o female w/ HLD and newly diagnosed CAD and HFrEF/ Ischemic CM.   Recently admitted to [facility] 12/22 w/ 6 month h/o progressive CP and exertional dyspnea, progressing to symptoms at rest. Ruled in for NSTEMI. Hs trop peaked to 1,936. Noted to have severely elevated LDL at 420 mg/dL. Echo showed moderately reduced LVEF, 35-40%, no MR. Normal RV. LHC showed 99% mLAD lesion, felt to be culprit.  She also had a high diagonal branch lesion in the 75% range but this was a small vessel, not amendable to PCI. She underwent successful PCI w/ placement of DES to the mLAD. LVEF felt closer to 25-30% by LVG. LVEDP was also elevated at 22 and she was treated w/ IV Lasix.   She was placed on DAPT w/ ASA + Brilinta, along w/ high dose Crestor and Zeita. GDMT for systolic HF was limited by soft BP. She was able to tolerate low dose Coreg and Farxiga, but BP too low for ARB/ARNi and MRA. Digoxin also added. Referred to [facility] clinic for post hospital f/u.   Presents today for f/u. Here w/ her mother. Doing well post discharge. Denies any resting or exertional dyspnea w/ basic ADLs. No further CP. Denies wt gain, no LEE. No further orthopnea/PND. Compliant w/ all medications. BP remains soft, [id number] systolic in clinic today but she reports SPB in the 80s this morning. She denies dizziness. No syncope/ near syncope. Pulse rate 60s.   Cardiac Testing   LHC 01/20/21   Mid LAD lesion is 99% stenosed.   A drug-eluting stent was successfully placed using a Girard [id number].   Post intervention, there is a 0% residual stenosis.   There is severe left ventricular systolic dysfunction.   LV end diastolic pressure is mildly elevated.   The left ventricular ejection fraction is 25-35% by visual  estimate.  2D echo 01/21/21 Left ventricular ejection fraction, by estimation, is 35 to 40%. Left ventricular ejection fraction by 3D volume is 44 %. The left ventricle has moderately decreased function. The left ventricle demonstrates regional wall motion abnormalities (see scoring diagram/findings for description). Left ventricular diastolic parameters are consistent with Grade I diastolic dysfunction (impaired relaxation). There is severe hypokinesis of the left ventricular, mid-apical anteroseptal wall, anterior wall and apical segment. No left ventricular thrombus is seen (Definity was used). 1. Right ventricular systolic function is normal. The right ventricular size is normal. There is normal pulmonary artery systolic pressure. 2. A small pericardial effusion is present. The pericardial effusion is localized near the right ventricle. 3. 4. The mitral valve is normal in structure. No evidence of mitral valve regurgitation. The aortic valve is tricuspid. Aortic valve regurgitation is not visualized. No aortic stenosis is present.  Review of Systems: [y] = yes, [ ]  = no   General: Weight gain [ ] ; Weight loss [ ] ; Anorexia [ ] ; Fatigue [ ] ; Fever [ ] ; Chills [ ] ; Weakness [ ]   Cardiac: Chest pain/pressure [ ] ; Resting SOB [ ] ; Exertional SOB [ ] ; Orthopnea [ ] ; Pedal Edema [ ] ; Palpitations [ ] ; Syncope [ ] ; Presyncope [ ] ; Paroxysmal nocturnal dyspnea[ ]   Pulmonary: Cough [ ] ; Wheezing[ ] ; Hemoptysis[ ] ; Sputum [ ] ; Snoring [ ]   GI: Vomiting[ ] ; Dysphagia[ ] ;  Melena[ ] ; Hematochezia [ ] ; Heartburn[ ] ; Abdominal pain [ ] ; Constipation [ ] ; Diarrhea [ ] ; BRBPR [ ]   GU: Hematuria[ ] ; Dysuria [ ] ; Nocturia[ ]   Vascular: Pain in legs with walking [ ] ; Pain in feet with lying flat [ ] ; Non-healing sores [ ] ; Stroke [ ] ; TIA [ ] ; Slurred speech [ ] ;  Neuro: Headaches[ ] ; Vertigo[ ] ; Seizures[ ] ; Paresthesias[ ] ;Blurred vision [ ] ; Diplopia [ ] ; Vision changes [ ]   Ortho/Skin: Arthritis [  ]; Joint pain [ ] ; Muscle pain [ ] ; Joint swelling [ ] ; Back Pain [ ] ; Rash [ ]   Psych: Depression[ ] ; Anxiety[ ]   Heme: Bleeding problems [ ] ; Clotting disorders [ ] ; Anemia [ ]   Endocrine: Diabetes [ ] ; Thyroid dysfunction[ ]    Past Medical History:  Diagnosis Date   High cholesterol     Current Outpatient Medications  Medication Sig Dispense Refill   acetaminophen (TYLENOL) 500 MG tablet Take 500 mg by mouth every 6 (six) hours as needed for moderate pain or mild pain.     aspirin 81 MG chewable tablet Chew 1 tablet (81 mg total) by mouth daily. 90 tablet 2   carvedilol (COREG) 3.125 MG tablet Take 1 tablet (3.125 mg total) by mouth 2 (two) times daily with a meal. 180 tablet 1   dapagliflozin propanediol (FARXIGA) 10 MG TABS tablet Take 1 tablet (10 mg total) by mouth daily. 90 tablet 1   digoxin (LANOXIN) 0.125 MG tablet Take 1 tablet (0.125 mg total) by mouth daily. 30 tablet 1   ezetimibe (ZETIA) 10 MG tablet Take 1 tablet (10 mg total) by mouth daily. 90 tablet 1   furosemide (LASIX) 20 MG tablet Take 1 tablet (20 mg total) by mouth daily as needed. For weight gain 3lb overnight, 5lbs in a week, shortness of breath or leg edema 30 tablet 1   nitroGLYCERIN (NITROSTAT) 0.4 MG SL tablet Place 1 tablet (0.4 mg total) under the tongue every 5 (five) minutes x 3 doses as needed for chest pain. 25 tablet 2   rosuvastatin (CRESTOR) 40 MG tablet Take 1 tablet (40 mg total) by mouth daily. 90 tablet 1   ticagrelor (BRILINTA) 90 MG TABS tablet Take 1 tablet (90 mg total) by mouth 2 (two) times daily. 180 tablet 2   No current facility-administered medications for this encounter.    Allergies  Allergen Reactions   Lactose Intolerance (Gi) Other (See Comments)    GI upset, SOB   Soy Allergy Other (See Comments)    GI upset   Sulfa Antibiotics Nausea And Vomiting      Social History   Socioeconomic History   Marital status: Single    Spouse name: Not on file   Number of children:  0   Years of education: Not on file   Highest education level: Not on file  Occupational History   Occupation: bartender/waitress    Comment: part time  Tobacco Use   Smoking status: Former    Packs/day: 0.50    Years: 3.00    Pack years: 1.50    Types: Cigarettes   Smokeless tobacco: Never  Vaping Use   Vaping Use: Never used  Substance and Sexual Activity   Alcohol use: Not Currently   Drug use: Yes    Frequency: 4.0 times per week    Types: Marijuana   Sexual activity: Not on file  Other Topics Concern   Not on file  Social History Narrative   Not on  file   Social Determinants of Health   Financial Resource Strain: High Risk   Difficulty of Paying Living Expenses: Hard  Food Insecurity: No Food Insecurity   Worried About Charity fundraiser in the Last Year: Never true   Ran Out of Food in the Last Year: Never true  Transportation Needs: No Transportation Needs   Lack of Transportation (Medical): No   Lack of Transportation (Non-Medical): No  Physical Activity: Not on file  Stress: Not on file  Social Connections: Not on file  Intimate Partner Violence: Not on file     No family history on file.  Vitals:   02/04/21 1309  BP: 102/62  Pulse: 66  SpO2: 98%  Weight: 70.5 kg (155 lb 6.4 oz)    PHYSICAL EXAM: General:  Well appearing. No respiratory difficulty HEENT: normal Neck: supple. no JVD. Carotids 2+ bilat; no bruits. No lymphadenopathy or thryomegaly appreciated. Cor: PMI nondisplaced. Regular rate & rhythm. No rubs, gallops or murmurs. Lungs: clear Abdomen: soft, nontender, nondistended. No hepatosplenomegaly. No bruits or masses. Good bowel sounds. Extremities: no cyanosis, clubbing, rash, edema Neuro: alert & oriented x 3, cranial nerves grossly intact. moves all 4 extremities w/o difficulty. Affect pleasant.  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 Recent NSTEM w/ 99% LAD stenosis as outlined  below, now s/p PCI. Echo 12/12, EF 35-40% (25% by LVG), RV normal - NYHA Class II. Euvolemic on exam. Soft BP limits med titration  - Continue Farxiga 10 mg daily  - Continue Coreg 3.125 mg bid - Contin Digoxin 0.125 mg daily. Check dig level today.  - BP remains too soft for ARB/ARNi and MRA currently (reports SBP in 80s this am but no orthostatic symptoms) - no need for loop diuretic currently  - discussed daily wts - check BMP today  - refer to the [facility] for further med med titration and monitoring until echo repeated. If EF improves, she can graduate from [facility] and continue routine f/u w/ cardiology   2. CAD - NSTEMI 12/22. LHC showed 99% mLAD lesion, felt to be culprit, treated w/ PCI + DES.  She also had a high diagonal branch lesion in the 75% range but this was a small vessel, not amendable to PCI. Treating medically  - stable w/o CP  - DAPT w/ ASA + Brilinta for minimum of 12 months - high intensity statin w/ LDL goal < 70  - on ? blocker, Coreg 3.125   3. HLD - LDL severely elevated, 420 mg/dL (prior to statin therapy) - Now on Crestor 40 mg + Zetia 10 mg  - ? Familial hypercholesterolemia - She has been referred to [facility] for genetic testing. May need PCSK9i therapy   4. SDO: currently unemployed, out of work post MI. Previously worked as Educational psychologist. Uninsured. She has submitted paperwork for insurance. Will refer to HF SW team. Will need help w/ medication assistance.   NYHA II GDMT  Diuretic- N/A BB- Coreg 3.125 mg bid  Ace/ARB/ARNI No (BP to low) MRA No (BP too low) SGLT2i Farxiga 10 mg daily     Referred to HFSW (PCP, Medications, Transportation, ETOH Abuse, Drug Abuse, Insurance, Museum/gallery curator ): Yes  Refer to Pharmacy: Yes  Refer to Home Health: No Refer to Advanced Heart Failure Clinic: Yes  Refer to General Cardiology: Yes (shared care)   Follow up: Refer to the [facility]. F/u w/ PharmD in 2 weeks and w/ APP in  6 weeks. Will assign to Dr. Gala Romney. We will  follow to help w/ med titration and until repeat echo. If EF normalized, can graduate from the [facility] and continue all cardiac care w/ cardiology. Cardiology will continue to manage CAD and lipids.    Robbie Lis, PA-C 02/04/2021

## 2021-02-04 NOTE — Telephone Encounter (Signed)
LCSW team received referral for pt as she is currently out of work and uninsured w/ no PCP.  Per hospital account was screened out by [employer] for Medicaid at this time. Has been contacted by financial counseling for assistance application but they were unable to reach her. She may be eligible for [employer] as well as [employer]/HF Fund to assist with medications.   I attempted to reach pt at [phone]. No answer, left voicemail requesting call back. I also have sent a message to [place], CSW, w/ HF clinic as pt has f/u with both of our clinics.   Octavio Graves, MSW, LCSW Clinical Social Worker II [facility] Navigation  [phone]- work cell phone (preferred) [phone]- desk phone

## 2021-02-04 NOTE — Telephone Encounter (Signed)
I attempted to reach patient.  I was unable to leave a message.  I will attempt call again at a later time.

## 2021-02-05 ENCOUNTER — Telehealth (HOSPITAL_COMMUNITY): Payer: Self-pay

## 2021-02-05 ENCOUNTER — Other Ambulatory Visit (HOSPITAL_COMMUNITY): Payer: Self-pay

## 2021-02-05 ENCOUNTER — Telehealth (HOSPITAL_COMMUNITY): Payer: Self-pay | Admitting: Surgery

## 2021-02-05 DIAGNOSIS — I5022 Chronic systolic (congestive) heart failure: Secondary | ICD-10-CM

## 2021-02-05 MED ORDER — DIGOXIN 125 MCG PO TABS: 0.0625 mg | ORAL_TABLET | Freq: Every day | ORAL | 1 refills | Status: DC

## 2021-02-05 NOTE — Telephone Encounter (Signed)
Pharmacy Transitions of Care Follow-up Telephone Call  Date of discharge: 01/22/21  Discharge Diagnosis: NSTEMI  How have you been since you were released from the hospital?  Patient well since discharge, no questions about meds at this time.  Medication changes made at discharge:      START taking: Aspirin Low Dose (aspirin)  Brilinta (ticagrelor)  carvedilol (COREG)  ezetimibe (ZETIA)  Farxiga (dapagliflozin propanediol)  furosemide (Lasix)  nitroGLYCERIN (NITROSTAT)  rosuvastatin (CRESTOR)  STOP taking: estradiol 0.05 mg/24hr patch (CLIMARA - Dosed in mg/24 hr)  progesterone 100 MG capsule (PROMETRIUM)   Medication changes verified by the patient? Yes    Medication Accessibility:  Home Pharmacy:  [address]  Was the patient provided with refills on discharged medications? Yes   Have all prescriptions been transferred from [facility] to home pharmacy?  Yes  Is the patient able to afford medications? Discharged on MATCH, will need patient assistance    Medication Review:  TICAGRELOR (BRILINTA) Ticagrelor 90 mg BID initiated on 01/22/21.  - Educated patient on expected duration of therapy of aspirin with ticagrelor.  - Discussed importance of taking medication around the same time every day, - Advised patient of medications to avoid (NSAIDs, aspirin maintenance doses>100 mg daily) - Educated that Tylenol (acetaminophen) will be the preferred analgesic to prevent risk of bleeding  - Emphasized importance of monitoring for signs and symptoms of bleeding (abnormal bruising, prolonged bleeding, nose bleeds, bleeding from gums, discolored urine, black tarry stools)  - Educated patient to notify doctor if shortness of breath or abnormal heartbeat occur - Advised patient to alert all providers of antiplatelet therapy prior to starting a new medication or having a procedure   Follow-up Appointments:  PCP Hospital f/u appt confirmed? Scheduled to see Dr.  Randa Evens on 02/18/21 @ 2:30pm.   Specialist Hospital f/u appt confirmed? Scheduled to see Dr. Flora Lipps on 02/06/21 @ 10:20am.   If their condition worsens, is the pt aware to call PCP or go to the Emergency Dept.? yes  Final Patient Assessment: Patient has f/u scheduled and refills at home pharmacy

## 2021-02-05 NOTE — Telephone Encounter (Signed)
Patient called back and I reviewed results and recommendations per Robbie Lis PA.  Patient is aware and agreeable.   I have scheduled the repeat lab for Jan 4th at 11:30 AM.  Medications updated in [facility].

## 2021-02-05 NOTE — Telephone Encounter (Signed)
-----   Message from Allayne Butcher, [place] sent at 02/04/2021  4:32 PM EST ----- Dig level borderline elevated. Reduce dose to 0.0625 mg daily. Repeat dig level in 7 days

## 2021-02-06 ENCOUNTER — Encounter: Payer: Self-pay | Admitting: Cardiovascular Disease

## 2021-02-06 ENCOUNTER — Other Ambulatory Visit: Payer: Self-pay

## 2021-02-06 ENCOUNTER — Ambulatory Visit (INDEPENDENT_AMBULATORY_CARE_PROVIDER_SITE_OTHER): Payer: Self-pay | Admitting: Cardiovascular Disease

## 2021-02-06 VITALS — BP 84/60 | HR 58 | Ht 64.0 in | Wt 150.6 lb

## 2021-02-06 DIAGNOSIS — I2510 Atherosclerotic heart disease of native coronary artery without angina pectoris: Secondary | ICD-10-CM

## 2021-02-06 DIAGNOSIS — I5022 Chronic systolic (congestive) heart failure: Secondary | ICD-10-CM

## 2021-02-06 DIAGNOSIS — E785 Hyperlipidemia, unspecified: Secondary | ICD-10-CM

## 2021-02-06 MED ORDER — EZETIMIBE 10 MG PO TABS: 10.0000 mg | ORAL_TABLET | Freq: Every day | ORAL | 1 refills | Status: DC

## 2021-02-06 MED ORDER — ROSUVASTATIN CALCIUM 40 MG PO TABS: 40.0000 mg | ORAL_TABLET | Freq: Every day | ORAL | 1 refills | Status: DC

## 2021-02-06 MED ORDER — FUROSEMIDE 20 MG PO TABS: 20.0000 mg | ORAL_TABLET | Freq: Every day | ORAL | 1 refills | Status: DC | PRN

## 2021-02-06 MED ORDER — DAPAGLIFLOZIN PROPANEDIOL 10 MG PO TABS: 10.0000 mg | ORAL_TABLET | Freq: Every day | ORAL | 1 refills | Status: DC

## 2021-02-06 MED ORDER — DIGOXIN 125 MCG PO TABS: 0.1250 mg | ORAL_TABLET | Freq: Every day | ORAL | 1 refills | Status: DC

## 2021-02-06 MED ORDER — CLOPIDOGREL BISULFATE 75 MG PO TABS: 75.0000 mg | ORAL_TABLET | Freq: Every day | ORAL | 3 refills | Status: DC

## 2021-02-06 MED ORDER — CARVEDILOL 3.125 MG PO TABS: 3.1250 mg | ORAL_TABLET | Freq: Two times a day (BID) | ORAL | 1 refills | Status: DC

## 2021-02-06 NOTE — Patient Instructions (Signed)
Medication Instructions:  START Plavix 75 mg daily when you finish Brilinta.  Increase Digoxin to 1 tablet (0.125 mg) daily   *If you need a refill on your cardiac medications before your next appointment, please call your pharmacy*   Testing/Procedures: Echocardiogram (3 month before follow up appointment) - Your physician has requested that you have an echocardiogram. Echocardiography is a painless test that uses sound waves to create images of your heart. It provides your doctor with information about the size and shape of your heart and how well your hearts chambers and valves are working. This procedure takes approximately one hour. There are no restrictions for this procedure. This will be performed at either our [facility] location - [address]  -or- [facility] location [facility] 2nd floor.    Follow-Up: At [facility], you and your health needs are our priority.  As part of our continuing mission to provide you with exceptional heart care, we have created designated Provider Care Teams.  These Care Teams include your primary Cardiologist (physician) and Advanced Practice Providers (APPs -  Physician Assistants and Nurse Practitioners) who all work together to provide you with the care you need, when you need it.  We recommend signing up for the patient portal called "MyChart".  Sign up information is provided on this After Visit Summary.  MyChart is used to connect with patients for Virtual Visits (Telemedicine).  Patients are able to view lab/test results, encounter notes, upcoming appointments, etc.  Non-urgent messages can be sent to your provider as well.   To learn more about what you can do with MyChart, go to [email or web address].    Your next appointment:   3 month(s)  The format for your next appointment:   In Person  Provider:   Reatha Harps, MD     Other Instructions Referral to [facility]- they will be in touch for this  appointment.

## 2021-02-06 NOTE — Progress Notes (Signed)
Cardiology Office Note:   Date:  02/06/2021  NAME:  Wanda Wood    MRN: [id number] DOB:  [birth year]   PCP:  Oneita Hurt, No  Cardiologist:  Reatha Harps, MD  Electrophysiologist:  None   Referring MD: No ref. provider found   Chief Complaint  Patient presents with   Coronary Artery Disease         History of Present Illness:   Wanda Wood is a 50 y.o. female with a hx of CAD, CHF who presents for follow-up.  She reports she is doing well.  BP 84/60.  Denies any dizziness or lightheadedness.  She did not tolerate guideline directed medical therapy in the hospital.  Recently seen by advanced heart failure clinic.  Digoxin dose was reduced.  Value is within limits.  I recommended she go back to 1 tablet daily.  She reports occasional tightness under her left breast.  She reports this is associated with gas.  Cath sites are well-healed.  EKG shows sinus rhythm with anterolateral T wave inversions.  There are improved from her prior tracing.  She is on aspirin and Brilinta.  She would not be able to afford Brilinta.  We will switch to Plavix.  She is not requiring Lasix.  She is on [place].  She is only on Coreg.  Overall has no heart failure symptoms.  She is okay to return to work.  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  We discussed genetic testing.  She wants to wait until she has insurance.  I think this is reasonable.  Overall is doing well since her heart attack.  Doing well.  Problem List CAD/NSTEMI -01/20/2021 -PCI to pLAD 2. Ischemic CM -EF 35-40% 3. Familial Hyperlipidemia -T chol 511, LDL 420, HDL 69, TG 110  Past Medical History: Past Medical History:  Diagnosis Date   High cholesterol     Past Surgical History: Past Surgical History:  Procedure Laterality Date   CERVICAL SPINE SURGERY     CORONARY STENT INTERVENTION N/A 01/20/2021   Procedure: CORONARY STENT INTERVENTION;  Surgeon: Runell Gess, MD;  Location: [facility];  Service:  Cardiovascular;  Laterality: N/A;   LEFT HEART CATH AND CORONARY ANGIOGRAPHY N/A 01/20/2021   Procedure: LEFT HEART CATH AND CORONARY ANGIOGRAPHY;  Surgeon: Runell Gess, MD;  Location: [facility] INVASIVE CV LAB;  Service: Cardiovascular;  Laterality: N/A;    Current Medications: Current Meds  Medication Sig   acetaminophen (TYLENOL) 500 MG tablet Take 500 mg by mouth every 6 (six) hours as needed for moderate pain or mild pain.   aspirin 81 MG chewable tablet Chew 1 tablet (81 mg total) by mouth daily.   clopidogrel (PLAVIX) 75 MG tablet Take 1 tablet (75 mg total) by mouth daily.   nitroGLYCERIN (NITROSTAT) 0.4 MG SL tablet Place 1 tablet (0.4 mg total) under the tongue every 5 (five) minutes x 3 doses as needed for chest pain.   [DISCONTINUED] carvedilol (COREG) 3.125 MG tablet Take 1 tablet (3.125 mg total) by mouth 2 (two) times daily with a meal.   [DISCONTINUED] dapagliflozin propanediol (FARXIGA) 10 MG TABS tablet Take 1 tablet (10 mg total) by mouth daily.   [DISCONTINUED] digoxin (LANOXIN) 0.125 MG tablet Take 0.5 tablets (0.0625 mg total) by mouth daily.   [DISCONTINUED] ezetimibe (ZETIA) 10 MG tablet Take 1 tablet (10 mg total) by mouth daily.   [DISCONTINUED] furosemide (LASIX) 20 MG tablet Take 1 tablet (20 mg total) by mouth daily as needed. For weight  gain 3lb overnight, 5lbs in a week, shortness of breath or leg edema   [DISCONTINUED] rosuvastatin (CRESTOR) 40 MG tablet Take 1 tablet (40 mg total) by mouth daily.   [DISCONTINUED] ticagrelor (BRILINTA) 90 MG TABS tablet Take 1 tablet (90 mg total) by mouth 2 (two) times daily.     Allergies:    Lactose intolerance (gi), Soy allergy, and Sulfa antibiotics   Social History: Social History   Socioeconomic History   Marital status: Single    Spouse name: Not on file   Number of children: 0   Years of education: Not on file   Highest education level: Not on file  Occupational History   Occupation: bartender/waitress     Comment: part time  Tobacco Use   Smoking status: Former    Packs/day: 0.50    Years: 3.00    Pack years: 1.50    Types: Cigarettes   Smokeless tobacco: Never  Vaping Use   Vaping Use: Never used  Substance and Sexual Activity   Alcohol use: Not Currently   Drug use: Yes    Frequency: 4.0 times per week    Types: Marijuana   Sexual activity: Not on file  Other Topics Concern   Not on file  Social History Narrative   Not on file   Social Determinants of Health   Financial Resource Strain: High Risk   Difficulty of Paying Living Expenses: Hard  Food Insecurity: No Food Insecurity   Worried About Running Out of Food in the Last Year: Never true   Ran Out of Food in the Last Year: Never true  Transportation Needs: No Transportation Needs   Lack of Transportation (Medical): No   Lack of Transportation (Non-Medical): No  Physical Activity: Not on file  Stress: Not on file  Social Connections: Not on file     Family History: The patient'sfamily history is not on file.  ROS:   All other ROS reviewed and negative. Pertinent positives noted in the HPI.     EKGs/Labs/Other Studies Reviewed:   The following studies were personally reviewed by me today:  EKG:  EKG is ordered today.  The ekg ordered today demonstrates sinus bradycardia heart rate 58, anterolateral T wave inversions, and was personally reviewed by me.   TTE 01/21/2021  1. Left ventricular ejection fraction, by estimation, is 35 to 40%. Left  ventricular ejection fraction by 3D volume is 44 %. The left ventricle has  moderately decreased function. The left ventricle demonstrates regional  wall motion abnormalities (see  scoring diagram/findings for description). Left ventricular diastolic  parameters are consistent with Grade I diastolic dysfunction (impaired  relaxation). There is severe hypokinesis of the left ventricular,  mid-apical anteroseptal wall, anterior wall and   apical segment. No left ventricular  thrombus is seen (Definity was used).   2. Right ventricular systolic function is normal. The right ventricular  size is normal. There is normal pulmonary artery systolic pressure.   3. A small pericardial effusion is present. The pericardial effusion is  localized near the right ventricle.   4. The mitral valve is normal in structure. No evidence of mitral valve  regurgitation.   5. The aortic valve is tricuspid. Aortic valve regurgitation is not  visualized. No aortic stenosis is present.   LHC 01/20/2021   Mid LAD lesion is 99% stenosed.   A drug-eluting stent was successfully placed using a STENT ONYX FRONTIER 2.25X15.   Post intervention, there is a 0% residual stenosis.   There  is severe left ventricular systolic dysfunction.   LV end diastolic pressure is mildly elevated.   The left ventricular ejection fraction is 25-35% by visual estimate.    Recent Labs: 01/18/2021: ALT 12; B Natriuretic Peptide 227.3; Magnesium 2.1; TSH 2.255 01/22/2021: Hemoglobin 15.3; Platelets 264 02/04/2021: BUN 11; Creatinine, Ser 0.83; Potassium 4.0; Sodium 137   Recent Lipid Panel    Component Value Date/Time   CHOL 511 (H) 01/19/2021 0020   TRIG 110 01/19/2021 0020   HDL 69 01/19/2021 0020   CHOLHDL 7.4 01/19/2021 0020   VLDL 22 01/19/2021 0020   LDLCALC 420 (H) 01/19/2021 0020    Physical Exam:   VS:  BP (!) 84/60    Pulse (!) 58    Ht  (1.626 m)    Wt 150 lb 9.6 oz (68.3 kg)    SpO2 97%    BMI 25.85 kg/m    Wt Readings from Last 3 Encounters:  02/06/21 150 lb 9.6 oz (68.3 kg)  02/04/21 155 lb 6.4 oz (70.5 kg)  01/22/21 149 lb 12.8 oz (67.9 kg)    General: Well nourished, well developed, in no acute distress Head: Atraumatic, normal size  Eyes: PEERLA, EOMI  Neck: Supple, no JVD Endocrine: No thryomegaly Cardiac: Normal S1, S2; RRR; no murmurs, rubs, or gallops Lungs: Clear to auscultation bilaterally, no wheezing, rhonchi or rales  Abd: Soft, nontender, no hepatomegaly   Ext: No edema, pulses 2+ Musculoskeletal: No deformities, BUE and BLE strength normal and equal Skin: Warm and dry, no rashes   Neuro: Alert and oriented to person, place, time, and situation, CNII-XII grossly intact, no focal deficits  Psych: Normal mood and affect   ASSESSMENT:   Wanda Wood is a 50 y.o. female who presents for the following: 1. Chronic systolic heart failure (HCC)   2. Coronary artery disease involving native coronary artery of native heart without angina pectoris   3. Hyperlipidemia LDL goal <70    PLAN:   1. Chronic systolic heart failure (HCC) 2. Coronary artery disease involving native coronary artery of native heart without angina pectoris 3. Hyperlipidemia LDL goal <70 -Admitted to the hospital 01/20/2021 with high risk non-STEMI.  Underwent PCI to the proximal LAD. -Continue aspirin.  She is on Brilinta.  She will complete 1 month of Brilinta and switch to Plavix 75 mg daily due to cost.  Complete 1 year of DAPT. -EF 35-40% with regional wall motion abnormalities in LAD distribution.  Did not tolerate GDMT in the hospital due to hypotension.  Currently on digoxin.  Increase to 0.125 mg daily.  Continue Coreg 3.125 mg twice daily.  She is on Farxiga 10 mg daily.  BP 84/60.  She will not tolerate further GDMT. -Despite her low blood pressure she has no symptoms.  She has no chest pain or trouble breathing that are concerning.  Would recommend to continue current therapy.  We will repeat her echocardiogram in 3 months.  I am hopeful that her EF will recover and this will all just be myocardial stunning. -Regarding her cholesterol her most recent LDL value was 420.  She will continue Crestor 40 mg daily as well as Zetia 10 mg daily.  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  We discussed genetic testing.  She is interested.  We will do this when she has health insurance.  She does not have children which is  reassuring as genetic testing is more impactful with  offspring but I do believe this will shed some light in her diagnosis and why she developed heart disease in such a young age with really no major risk factors other than her very elevated LDL cholesterol. -Euvolemic on exam.  Lasix as needed.     Disposition: No follow-ups on file.  Medication Adjustments/Labs and Tests Ordered: Current medicines are reviewed at length with the patient today.  Concerns regarding medicines are outlined above.  Orders Placed This Encounter  Procedures   AMB Referral to [facility] Pharm-D   EKG 12-Lead   ECHOCARDIOGRAM COMPLETE   Meds ordered this encounter  Medications   clopidogrel (PLAVIX) 75 MG tablet    Sig: Take 1 tablet (75 mg total) by mouth daily.    Dispense:  90 tablet    Refill:  3   carvedilol (COREG) 3.125 MG tablet    Sig: Take 1 tablet (3.125 mg total) by mouth 2 (two) times daily with a meal.    Dispense:  180 tablet    Refill:  1   dapagliflozin propanediol (FARXIGA) 10 MG TABS tablet    Sig: Take 1 tablet (10 mg total) by mouth daily.    Dispense:  90 tablet    Refill:  1   digoxin (LANOXIN) 0.125 MG tablet    Sig: Take 1 tablet (0.125 mg total) by mouth daily.    Dispense:  30 tablet    Refill:  1   ezetimibe (ZETIA) 10 MG tablet    Sig: Take 1 tablet (10 mg total) by mouth daily.    Dispense:  90 tablet    Refill:  1   furosemide (LASIX) 20 MG tablet    Sig: Take 1 tablet (20 mg total) by mouth daily as needed. For weight gain 3lb overnight, 5lbs in a week, shortness of breath or leg edema    Dispense:  30 tablet    Refill:  1   rosuvastatin (CRESTOR) 40 MG tablet    Sig: Take 1 tablet (40 mg total) by mouth daily.    Dispense:  90 tablet    Refill:  1    Patient Instructions  Medication Instructions:  START Plavix 75 mg daily when you finish Brilinta.  Increase Digoxin to 1 tablet (0.125 mg) daily   *If you need a refill on your cardiac medications before your  next appointment, please call your pharmacy*   Testing/Procedures: Echocardiogram (3 month before follow up appointment) - Your physician has requested that you have an echocardiogram. Echocardiography is a painless test that uses sound waves to create images of your heart. It provides your doctor with information about the size and shape of your heart and how well your hearts chambers and valves are working. This procedure takes approximately one hour. There are no restrictions for this procedure. This will be performed at either our [facility] location - [address], [address]  -or- [facility] location [facility] 2nd floor.    Follow-Up: At [facility], you and your health needs are our priority.  As part of our continuing mission to provide you with exceptional heart care, we have created designated Provider Care Teams.  These Care Teams include your primary Cardiologist (physician) and Advanced Practice Providers (APPs -  Physician Assistants and Nurse Practitioners) who all work together to provide you with the care you need, when you need it.  We recommend signing up for the patient portal called "MyChart".  Sign up information is provided on this After  Visit Summary.  MyChart is used to connect with patients for Virtual Visits (Telemedicine).  Patients are able to view lab/test results, encounter notes, upcoming appointments, etc.  Non-urgent messages can be sent to your provider as well.   To learn more about what you can do with MyChart, go to [email or web address].    Your next appointment:   3 month(s)  The format for your next appointment:   In Person  Provider:   Reatha Harps, MD     Other Instructions Referral to [facility]- they will be in touch for this appointment.     Time Spent with Patient: I have spent a total of 35 minutes with patient reviewing hospital notes, telemetry, EKGs, labs and examining the patient as well as establishing an  assessment and plan that was discussed with the patient.  > 50% of time was spent in direct patient care.  Signed, Lenna Gilford. Flora Lipps, MD, [facility]   [facility]  [address] [place], [place] [address] [phone]  02/06/2021 11:17 AM

## 2021-02-06 NOTE — Progress Notes (Signed)
Heart and Vascular Care Navigation  02/06/2021  Merla Sawka [birth year] [id number]  Reason for Referral:  Engaged with patient face to face for follow up visit for Heart and Vascular Care Coordination.                                                                                                   Assessment:     LCSW met with pt at conclusion of her appointment at [facility] today. Introduced self, role, reason for visit. Pt mother accompanies her to appt today. I let pt know that I had left a message for her on her voicemail so if she gets that message that she can delete it. Confirmed she currently does not have any insurance, will get insurance at beginning of year (February 1st). She has spoken with [place], Development worker, community and was told that she would be mailed a financial assistance application. I was able to provide her one today in office that has been started with needed information noted on it. I also gave verbal explanation of how to complete and which documents required.  I encouraged her to call or text me with any questions/when she has submitted final application w/ documents so I can keep track. Pt also with some concern about costs of medications; she has enough to last roughly through 1/11. It appears she may f/u with AHF clinic so some assistance may be available. If not, then she can utilize [place] for discount/we can financially assist as able until her insurance kicks in.                                  HRT/VAS Care Coordination     Patients Home Cardiology Office [place] Team Social Worker   Social Worker Name: Margarito Liner [place], [phone]   Living arrangements for the past 2 months Single Family Home   Lives with: Parents   Patient Current Insurance Coverage Self-Pay   Patient Has Concern With Paying Medical Bills Yes   Patient Concerns With Medical Bills currently uninsured, will have insurance Feb  1st   Medical Bill Referrals: given Larence Penning Financial Assistance application   Does Patient Have Prescription Coverage? No   Home Assistive Devices/Equipment None       Social History:                                                                             SDOH Screenings   Alcohol Screen: Low Risk    Last Alcohol Screening Score (AUDIT): 0  Depression (PHQ2-9): Not on file  Financial Resource Strain: High Risk   Difficulty of Paying Living Expenses: Hard  Food Insecurity: No Food Insecurity   Worried About Running  Out of Food in the Last Year: Never true   Ran Out of Food in the Last Year: Never true  Housing: Low Risk    Last Housing Risk Score: 0  Physical Activity: Not on file  Social Connections: Not on file  Stress: Not on file  Tobacco Use: Medium Risk   Smoking Tobacco Use: Former   Smokeless Tobacco Use: Never   Passive Exposure: Not on file  Transportation Needs: No Transportation Needs   Lack of Transportation (Medical): No   Lack of Transportation (Non-Medical): No    SDOH Interventions: Financial Resources:  Financial Strain Interventions: Other (Comment) (provided [employer]  application; will f/u about medications in new year to assist) Financial Counseling for [facility]    Other Care Navigation Interventions:     Provided Pharmacy assistance resources  Will f/u in January to provide samples/assistance until her insurance begins in Feb 2023   Follow-up plan:   LCSW will f/u with pt in one week if I do not hear from her before that time. I have given her my card. She shares she often has her phone off but receives text messages a bit more easily than keeping track of calls.

## 2021-02-11 ENCOUNTER — Telehealth: Payer: Self-pay | Admitting: Licensed Clinical Social Worker

## 2021-02-11 ENCOUNTER — Encounter: Payer: Self-pay | Admitting: Cardiovascular Disease

## 2021-02-11 NOTE — Telephone Encounter (Signed)
Incoming text message from pt this morning. She shares that she is planning on sending her application for [employer] today. She confirmed mailing address with me. I had shared w/ pt during visit that likely her application may take about a month or more to process.   Octavio Graves, MSW, LCSW Clinical Social Worker II [facility] Navigation  [phone]- work cell phone (preferred) [phone]- desk phone

## 2021-02-12 ENCOUNTER — Other Ambulatory Visit (HOSPITAL_COMMUNITY): Payer: Self-pay

## 2021-02-18 ENCOUNTER — Inpatient Hospital Stay (INDEPENDENT_AMBULATORY_CARE_PROVIDER_SITE_OTHER): Payer: Self-pay | Admitting: Primary Care

## 2021-02-19 ENCOUNTER — Other Ambulatory Visit (HOSPITAL_COMMUNITY): Payer: Self-pay

## 2021-02-19 ENCOUNTER — Encounter: Payer: Self-pay | Admitting: Cardiovascular Disease

## 2021-02-20 ENCOUNTER — Encounter: Payer: Self-pay | Admitting: Cardiovascular Disease

## 2021-02-26 ENCOUNTER — Ambulatory Visit: Payer: Self-pay | Admitting: Cardiovascular Disease

## 2021-02-28 ENCOUNTER — Telehealth: Payer: Self-pay | Admitting: Licensed Clinical Social Worker

## 2021-02-28 NOTE — Telephone Encounter (Signed)
Pt contacted this Clinical research associate inquiring about a bill received. I encouraged her to f/u with billing and let them know about when she mailed her application. Pt called me back again after speaking w/ billing, she shares that they have not yet received application even though it was mailed 1/03, she was given the number for Princess Perna, Artist. I recommended she reach out to her to f/u on when it was mailed and then f/u weekly to check on status. Unfortunately, since it was mailed directly by pt I do not have any additional details at this time. Pt encouraged to reach out to me if any additional questions or concerns.   Octavio Graves, MSW, LCSW Clinical Social Worker II [facility] Navigation  [phone]- work cell phone (preferred) [phone]- desk phone

## 2021-03-04 ENCOUNTER — Ambulatory Visit: Payer: Self-pay

## 2021-03-05 ENCOUNTER — Ambulatory Visit: Payer: Self-pay | Admitting: Family Medicine

## 2021-03-07 ENCOUNTER — Ambulatory Visit (INDEPENDENT_AMBULATORY_CARE_PROVIDER_SITE_OTHER): Payer: Managed Care, Other (non HMO)

## 2021-03-07 ENCOUNTER — Encounter: Payer: Self-pay | Admitting: Cardiovascular Disease

## 2021-03-07 DIAGNOSIS — R002 Palpitations: Secondary | ICD-10-CM

## 2021-03-07 NOTE — Progress Notes (Unsigned)
Enrolled patient for a 3 day Zio XT monitor to be mailed to patients home  

## 2021-03-12 DIAGNOSIS — R002 Palpitations: Secondary | ICD-10-CM

## 2021-03-13 LAB — HM PAP SMEAR
HM Pap smear: NEGATIVE
HM Pap smear: NEGATIVE

## 2021-03-19 LAB — HEPATIC FUNCTION PANEL
ALT: 12 U/L (ref 7–35)
AST: 14 (ref 13–35)
Alkaline Phosphatase: 91 (ref 25–125)
Bilirubin, Total: 0.3

## 2021-03-19 LAB — COMPREHENSIVE METABOLIC PANEL
Albumin: 4.6 (ref 3.5–5.0)
Calcium: 9.5 (ref 8.7–10.7)
Globulin: 2.5

## 2021-03-19 LAB — LIPID PANEL
Cholesterol: 284 — AB (ref 0–200)
HDL: 76 — AB (ref 35–70)
LDL Cholesterol: 194
Triglycerides: 87 (ref 40–160)

## 2021-03-19 LAB — CBC: RBC: 4.39 (ref 3.87–5.11)

## 2021-03-19 LAB — CBC AND DIFFERENTIAL
HCT: 38 (ref 36–46)
Hemoglobin: 12.9 (ref 12.0–16.0)
Platelets: 244 10*3/uL (ref 150–400)
WBC: 6

## 2021-03-19 LAB — HEMOGLOBIN A1C: Hemoglobin A1C: 5.8

## 2021-03-19 LAB — TSH: TSH: 2.02 (ref 0.41–5.90)

## 2021-03-19 LAB — BASIC METABOLIC PANEL
BUN: 8 (ref 4–21)
CO2: 25 — AB (ref 13–22)
Chloride: 164 — AB (ref 99–108)
Creatinine: 0.8 (ref <=1.1)
Glucose: 98
Potassium: 4.6 mEq/L (ref 3.5–5.1)
Sodium: 146 (ref 137–147)

## 2021-03-19 NOTE — Progress Notes (Signed)
Cardiology Clinic Note   Patient Name: Wanda Wood Date of Encounter: 03/21/2021  Primary Care Provider:  Pcp, No Primary Cardiologist:  Evalina Field, MD  Patient Profile    Wanda Wood 51 year old female presents to the clinic today for follow-up evaluation of her coronary artery disease, ischemic cardiomyopathy, and palpitations.  Past Medical History    Past Medical History:  Diagnosis Date   High cholesterol    Past Surgical History:  Procedure Laterality Date   CERVICAL SPINE SURGERY     CORONARY STENT INTERVENTION N/A 01/20/2021   Procedure: CORONARY STENT INTERVENTION;  Surgeon: Lorretta Harp, MD;  Location: [facility];  Service: Cardiovascular;  Laterality: N/A;   LEFT HEART CATH AND CORONARY ANGIOGRAPHY N/A 01/20/2021   Procedure: LEFT HEART CATH AND CORONARY ANGIOGRAPHY;  Surgeon: Lorretta Harp, MD;  Location: [place] [facility];  Service: Cardiovascular;  Laterality: N/A;    Allergies  Allergies  Allergen Reactions   Lactose Intolerance (Gi) Other (See Comments)    GI upset, SOB   Soy Allergy Other (See Comments)    GI upset   Sulfa Antibiotics Nausea And Vomiting    History of Present Illness    Wanda Wood has a PMH of anxiety, chronic systolic CHF, coronary artery disease, and hyperlipidemia.  She was seen by Dr. Audie Box 01/29/2021 for follow-up of her coronary artery disease and CHF.  Her blood pressure at that time was 84/60.  She denied dizziness and lightheadedness.  She was tolerating her medical therapy well since being discharged from the hospital.  She had also recently been seen by advanced heart failure clinic.  At that time her digoxin was reduced.  Her value returned to normal limits.  It was recommended that she go back on 1 tablet.  She did report occasional tightness under her left breast.  It was felt that this was related to gas type pain.  Her cardiac cath site healed well.  The EKG showed sinus rhythm with  anterior lateral T wave inversions.  This was improved from her prior EKG.  She was on Brilinta and aspirin.  She reported that she would not be able to afford Brilinta.  She was transitioned to Plavix.  She was not requiring diuretic therapy.  She continued on Farxiga.  She reported compliance with carvedilol.  She had no heart failure symptoms.  She was given permission to return to work.  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  She requested waiting until she had insurance prior to testing.  Overall she was doing well from a cardiac standpoint.  A cardiac event monitor was sent to her on 03/07/2021.  Results of her monitor have not yet posted.  She presents to the clinic today for follow-up evaluation states she feels well.  She has noticed 4-5 episodes of jaw pain.  During these episodes she took nitroglycerin which relieved her pain.  She reports her episodes were while she was at rest.  We reviewed her angiography and echocardiogram.  She was not able to tolerate digoxin due to rash and it was discontinued.  She has not been avoiding salt in her diet.  She does report that she has not needed to take any diuretic therapy for lower extremity swelling.  Over the last week she was felt well and reports that she has been exercising 2 times per week for 30-45 minutes.  She has been using the equipment at [employer] ( recumbent bike, treadmill).  We reviewed her upcoming appointment for her echocardiogram and she reports that she has returned to her cardiac event monitor.  Results have not yet been posted.  I will give her the salty 6 diet sheet, have her increase her physical activity as tolerated (goal 150 minutes of moderate physical activity per week), continue current medication regimen, and plan follow-up in 3 to 4 months.  Today she denies chest pain, shortness of breath, lower extremity edema, fatigue, palpitations, melena, hematuria,  hemoptysis, diaphoresis, weakness, presyncope, syncope, orthopnea, and PND.   Home Medications    Prior to Admission medications   Medication Sig Start Date End Date Taking? Authorizing Provider  acetaminophen (TYLENOL) 500 MG tablet Take 500 mg by mouth every 6 (six) hours as needed for moderate pain or mild pain.    [provider]  aspirin 81 MG chewable tablet Chew 1 tablet (81 mg total) by mouth daily. 01/23/21   Cheryln Manly, NP  carvedilol (COREG) 3.125 MG tablet Take 1 tablet (3.125 mg total) by mouth 2 (two) times daily with a meal. 02/06/21   O'Neal, Cassie Freer, MD  clopidogrel (PLAVIX) 75 MG tablet Take 1 tablet (75 mg total) by mouth daily. 02/06/21   O'NealCassie Freer, MD  dapagliflozin propanediol (FARXIGA) 10 MG TABS tablet Take 1 tablet (10 mg total) by mouth daily. 02/06/21   O'NealCassie Freer, MD  digoxin (LANOXIN) 0.125 MG tablet Take 1 tablet (0.125 mg total) by mouth daily. 02/06/21   O'NealCassie Freer, MD  ezetimibe (ZETIA) 10 MG tablet Take 1 tablet (10 mg total) by mouth daily. 02/06/21   O'NealCassie Freer, MD  furosemide (LASIX) 20 MG tablet Take 1 tablet (20 mg total) by mouth daily as needed. For weight gain 3lb overnight, 5lbs in a week, shortness of breath or leg edema 02/06/21   O'Neal, Cassie Freer, MD  nitroGLYCERIN (NITROSTAT) 0.4 MG SL tablet Place 1 tablet (0.4 mg total) under the tongue every 5 (five) minutes x 3 doses as needed for chest pain. 01/22/21   Cheryln Manly, NP  rosuvastatin (CRESTOR) 40 MG tablet Take 1 tablet (40 mg total) by mouth daily. 02/06/21   O'NealCassie Freer, MD    Family History    History reviewed. No pertinent family history. She indicated that her mother is alive.  Social History    Social History   Socioeconomic History   Marital status: Single    Spouse name: Not on file   Number of children: 0   Years of education: Not on file   Highest education level: Not on file   Occupational History   Occupation: bartender/waitress    Comment: part time  Tobacco Use   Smoking status: Former    Packs/day: 0.50    Years: 3.00    Pack years: 1.50    Types: Cigarettes   Smokeless tobacco: Never  Vaping Use   Vaping Use: Never used  Substance and Sexual Activity   Alcohol use: Not Currently   Drug use: Yes    Frequency: 4.0 times per week    Types: Marijuana   Sexual activity: Not on file  Other Topics Concern   Not on file  Social History Narrative   Not on file   Social Determinants of Health   Financial Resource Strain: High Risk   Difficulty of Paying Living Expenses: Hard  Food Insecurity: No Food Insecurity   Worried About Running Out of Food in the  Last Year: Never true   Ran Out of Food in the Last Year: Never true  Transportation Needs: No Transportation Needs   Lack of Transportation (Medical): No   Lack of Transportation (Non-Medical): No  Physical Activity: Not on file  Stress: Not on file  Social Connections: Not on file  Intimate Partner Violence: Not on file     Review of Systems    General:  No chills, fever, night sweats or weight changes.  Cardiovascular:  No chest pain, dyspnea on exertion, edema, orthopnea, palpitations, paroxysmal nocturnal dyspnea. Dermatological: No rash, lesions/masses Respiratory: No cough, dyspnea Urologic: No hematuria, dysuria Abdominal:   No nausea, vomiting, diarrhea, bright red blood per rectum, melena, or hematemesis Neurologic:  No visual changes, wkns, changes in mental status. All other systems reviewed and are otherwise negative except as noted above.  Physical Exam    VS:  BP 106/76 (BP Location: Left Arm, Patient Position: Sitting, Cuff Size: Normal)    Pulse 61    Ht 5' 4.5" (1.638 m)    Wt 149 lb 6.4 oz (67.8 kg)    SpO2 96%    BMI 25.25 kg/m  , BMI Body mass index is 25.25 kg/m. GEN: Well nourished, well developed, in no acute distress. HEENT: normal. Neck: Supple, no JVD, carotid  bruits, or masses. Cardiac: RRR, no murmurs, rubs, or gallops. No clubbing, cyanosis, edema.  Radials/DP/PT 2+ and equal bilaterally.  Respiratory:  Respirations regular and unlabored, clear to auscultation bilaterally. GI: Soft, nontender, nondistended, BS + x 4. MS: no deformity or atrophy. Skin: warm and dry, no rash. Neuro:  Strength and sensation are intact. Psych: Normal affect.  Accessory Clinical Findings    Recent Labs: 01/18/2021: ALT 12; B Natriuretic Peptide 227.3; Magnesium 2.1; TSH 2.255 01/22/2021: Hemoglobin 15.3; Platelets 264 02/04/2021: BUN 11; Creatinine, Ser 0.83; Potassium 4.0; Sodium 137   Recent Lipid Panel    Component Value Date/Time   CHOL 511 (H) 01/19/2021 0020   TRIG 110 01/19/2021 0020   HDL 69 01/19/2021 0020   CHOLHDL 7.4 01/19/2021 0020   VLDL 22 01/19/2021 0020   LDLCALC 420 (H) 01/19/2021 0020    ECG personally reviewed by me today-none today.  Echocardiogram 01/21/2021 IMPRESSIONS     1. Left ventricular ejection fraction, by estimation, is 35 to 40%. Left  ventricular ejection fraction by 3D volume is 44 %. The left ventricle has  moderately decreased function. The left ventricle demonstrates regional  wall motion abnormalities (see  scoring diagram/findings for description). Left ventricular diastolic  parameters are consistent with Grade I diastolic dysfunction (impaired  relaxation). There is severe hypokinesis of the left ventricular,  mid-apical anteroseptal wall, anterior wall and   apical segment. No left ventricular thrombus is seen (Definity was used).   2. Right ventricular systolic function is normal. The right ventricular  size is normal. There is normal pulmonary artery systolic pressure.   3. A small pericardial effusion is present. The pericardial effusion is  localized near the right ventricle.   4. The mitral valve is normal in structure. No evidence of mitral valve  regurgitation.   5. The aortic valve is  tricuspid. Aortic valve regurgitation is not  visualized. No aortic stenosis is present.   Cardiac catheterization 01/20/2021   Mid LAD lesion is 99% stenosed.   A drug-eluting stent was successfully placed using a [place] [id number].   Post intervention, there is a 0% residual stenosis.   There is severe left ventricular systolic dysfunction.  LV end diastolic pressure is mildly elevated.   The left ventricular ejection fraction is 25-35% by visual estimate.   Wanda Wood is a 51 y.o. female      [id number] LOCATION:  FACILITY: [facility]  PHYSICIAN: Quay Burow, M.D. April 10, 1970 Diagnostic Dominance: Right Intervention    Assessment & Plan   1.  Coronary artery disease-has had 4-5 episodes of chest discomfort that were relieved by nitroglycerin.  Episodes were at rest.  She has been increasing her physical activity and going to the gym 2 days/week, exercising 30-45 minutes at a time and denies chest discomfort.  Reports compliance with her medication and denies side effects.  Would recommend addition of amlodipine in the future if blood pressure will allow and symptoms worsen/persist. Continue carvedilol, Farxiga, Plavix, aspirin, nitroglycerin as needed Heart healthy low-sodium diet-salty 6 given Increase physical activity as tolerated  Familial hyperlipidemia-01/19/2021: Cholesterol 511; HDL 69; LDL Cholesterol 420; Triglycerides 110; VLDL 22 Continue rosuvastatin, ezetimibe Heart healthy low-sodium high-fiber diet Increase physical activity as tolerated  Ischemic cardiomyopathy-euvolemic.  NYHA class I.  Previously unable to tolerate GDMT due to hypotension. Continue  carvedilol, Farxiga, aspirin, rosuvastatin, ezetimibe, furosemide as needed Heart healthy low-sodium diet-salty 6 given Increase physical activity as tolerated Follows with advanced heart failure clinic  Disposition: Follow-up with Dr. Audie Box in 4 months.  Wanda Ng. Myriah Boggus NP-C    03/21/2021,  10:12 AM [address] Office [phone] Fax [phone]  Notice: This dictation was prepared with Dragon dictation along with smaller phrase technology. Any transcriptional errors that result from this process are unintentional and may not be corrected upon review.  I spent 14 minutes examining this patient, reviewing medications, and using patient centered shared decision making involving her cardiac care.  Prior to her visit I spent greater than 20 minutes reviewing her past medical history,  medications, and prior cardiac tests.

## 2021-03-21 ENCOUNTER — Encounter: Payer: Self-pay | Admitting: General Practice

## 2021-03-21 ENCOUNTER — Ambulatory Visit (INDEPENDENT_AMBULATORY_CARE_PROVIDER_SITE_OTHER): Payer: Self-pay | Admitting: General Practice

## 2021-03-21 ENCOUNTER — Other Ambulatory Visit: Payer: Self-pay

## 2021-03-21 VITALS — BP 106/76 | HR 61 | Ht 64.5 in | Wt 149.4 lb

## 2021-03-21 DIAGNOSIS — I2510 Atherosclerotic heart disease of native coronary artery without angina pectoris: Secondary | ICD-10-CM

## 2021-03-21 DIAGNOSIS — I255 Ischemic cardiomyopathy: Secondary | ICD-10-CM

## 2021-03-21 DIAGNOSIS — E785 Hyperlipidemia, unspecified: Secondary | ICD-10-CM

## 2021-03-21 MED ORDER — NITROGLYCERIN 0.4 MG SL SUBL
0.4000 mg | SUBLINGUAL_TABLET | SUBLINGUAL | 2 refills | Status: AC | PRN
Start: 2021-03-21 — End: ?

## 2021-03-21 NOTE — Patient Instructions (Signed)
Medication Instructions:  The current medical regimen is effective;  continue present plan and medications as directed. Please refer to the Current Medication list given to you today.   *If you need a refill on your cardiac medications before your next appointment, please call your pharmacy*  Lab Work:   Testing/Procedures:  NONE    NONE  Special Instructions PLEASE READ AND FOLLOW SALTY 6-ATTACHED-1,800mg  daily  PLEASE INCREASE PHYSICAL ACTIVITY AS TOLERATED   DON'T FORGET ABOUT ECHO NEXT MONTH  Follow-Up: Your next appointment:  3-4 month(s) In Person with Reatha Harps, MD ONLY  At [facility], you and your health needs are our priority.  As part of our continuing mission to provide you with exceptional heart care, we have created designated Provider Care Teams.  These Care Teams include your primary Cardiologist (physician) and Advanced Practice Providers (APPs -  Physician Assistants and Nurse Practitioners) who all work together to provide you with the care you need, when you need it.            6 SALTY THINGS TO AVOID     1,800MG  DAILY

## 2021-03-24 ENCOUNTER — Other Ambulatory Visit: Payer: Self-pay

## 2021-03-24 DIAGNOSIS — E785 Hyperlipidemia, unspecified: Secondary | ICD-10-CM

## 2021-03-26 ENCOUNTER — Encounter: Payer: Self-pay | Admitting: Cardiovascular Disease

## 2021-03-26 DIAGNOSIS — E785 Hyperlipidemia, unspecified: Secondary | ICD-10-CM

## 2021-03-27 ENCOUNTER — Encounter (HOSPITAL_COMMUNITY): Payer: Self-pay | Admitting: Emergency Medicine

## 2021-03-27 ENCOUNTER — Other Ambulatory Visit: Payer: Self-pay

## 2021-03-27 ENCOUNTER — Encounter: Payer: Self-pay | Admitting: Emergency Medicine

## 2021-03-27 ENCOUNTER — Emergency Department (HOSPITAL_COMMUNITY): Payer: Commercial Managed Care - HMO

## 2021-03-27 ENCOUNTER — Ambulatory Visit
Admission: EM | Admit: 2021-03-27 | Discharge: 2021-03-27 | Disposition: A | Discharge status: Home / Self Care | Payer: Commercial Managed Care - HMO | Source: Home / Self Care

## 2021-03-27 ENCOUNTER — Emergency Department (HOSPITAL_COMMUNITY)
Admission: EM | Admit: 2021-03-27 | Discharge: 2021-03-27 | Disposition: A | Discharge status: Home / Self Care | Payer: Commercial Managed Care - HMO | Attending: Emergency Medicine | Admitting: Emergency Medicine

## 2021-03-27 DIAGNOSIS — R051 Acute cough: Secondary | ICD-10-CM | POA: Insufficient documentation

## 2021-03-27 DIAGNOSIS — R7981 Abnormal blood-gas level: Secondary | ICD-10-CM | POA: Insufficient documentation

## 2021-03-27 DIAGNOSIS — R0602 Shortness of breath: Secondary | ICD-10-CM | POA: Insufficient documentation

## 2021-03-27 DIAGNOSIS — Z5321 Procedure and treatment not carried out due to patient leaving prior to being seen by health care provider: Secondary | ICD-10-CM | POA: Diagnosis not present

## 2021-03-27 HISTORY — DX: Acute myocardial infarction, unspecified: I21.9

## 2021-03-27 HISTORY — DX: Acute systolic (congestive) heart failure: I50.21

## 2021-03-27 LAB — CBC WITH DIFFERENTIAL/PLATELET
Abs Immature Granulocytes: 0.04 10*3/uL (ref 0.00–0.07)
Basophils Absolute: 0 10*3/uL (ref 0.0–0.1)
Basophils Relative: 0 %
Eosinophils Absolute: 0.2 10*3/uL (ref 0.0–0.5)
Eosinophils Relative: 2 %
HCT: 42.8 % (ref 36.0–46.0)
Hemoglobin: 14 g/dL (ref 12.0–15.0)
Immature Granulocytes: 0 %
Lymphocytes Relative: 17 %
Lymphs Abs: 1.8 10*3/uL (ref 0.7–4.0)
MCH: 29.7 pg (ref 26.0–34.0)
MCHC: 32.7 g/dL (ref 30.0–36.0)
MCV: 90.9 fL (ref 80.0–100.0)
Monocytes Absolute: 1 10*3/uL (ref 0.1–1.0)
Monocytes Relative: 9 %
Neutro Abs: 7.8 10*3/uL — ABNORMAL HIGH (ref 1.7–7.7)
Neutrophils Relative %: 72 %
Platelets: 235 10*3/uL (ref 150–400)
RBC: 4.71 MIL/uL (ref 3.87–5.11)
RDW: 13 % (ref 11.5–15.5)
WBC: 10.8 10*3/uL — ABNORMAL HIGH (ref 4.0–10.5)
nRBC: 0 % (ref 0.0–0.2)

## 2021-03-27 LAB — COMPREHENSIVE METABOLIC PANEL
ALT: 12 U/L (ref 0–44)
AST: 17 U/L (ref 15–41)
Albumin: 4.2 g/dL (ref 3.5–5.0)
Alkaline Phosphatase: 84 U/L (ref 38–126)
Anion gap: 12 (ref 5–15)
BUN: <5 mg/dL — ABNORMAL LOW (ref 6–20)
CO2: 24 mmol/L (ref 22–32)
Calcium: 9.7 mg/dL (ref 8.9–10.3)
Chloride: 103 mmol/L (ref 98–111)
Creatinine, Ser: 0.82 mg/dL (ref 0.44–1.00)
GFR, Estimated: >60 mL/min (ref >60)
Glucose, Bld: 118 mg/dL — ABNORMAL HIGH (ref 70–99)
Potassium: 4.1 mmol/L (ref 3.5–5.1)
Sodium: 139 mmol/L (ref 135–145)
Total Bilirubin: 0.5 mg/dL (ref 0.3–1.2)
Total Protein: 7.5 g/dL (ref 6.5–8.1)

## 2021-03-27 LAB — TROPONIN I (HIGH SENSITIVITY)
Troponin I (High Sensitivity): 6 ng/L (ref <18)
Troponin I (High Sensitivity): 5 ng/L (ref <18)

## 2021-03-27 LAB — BRAIN NATRIURETIC PEPTIDE: B Natriuretic Peptide: 33.7 pg/mL (ref 0.0–100.0)

## 2021-03-27 NOTE — ED Triage Notes (Signed)
Patient BIB [facility] from urgent care for evaluation, room air SpO2 88%, increased to low 90's on 2L, 96% on 4L O2 [place]. Patient reports resolution of shortness of breath with 4L O2 [place]. Patient alert, oriented, and in no apparent distress at this time.

## 2021-03-27 NOTE — ED Notes (Signed)
Patient is being discharged from the Urgent Care and sent to the Emergency Department via EMS . Per Ervin Knack NP, patient is in need of higher level of care due to hypoxia, rule out heart failure, myocardial infarction. Patient is aware and verbalizes understanding of plan of care.  Vitals:   03/27/21 1019  BP: 101/70  Pulse: 91  Resp: 16  Temp: 98.6 F (37 C)  SpO2: 91%

## 2021-03-27 NOTE — ED Triage Notes (Signed)
Pt c/o cough and fever starting 2 days prior. Reports feeling increasingly SOB, hx of heart problems. States she did vomit yesterday, body aches, and headache. Mother was sick last week. Reports coughing up mostly fluid and some phlegm. Denies CP, N/D

## 2021-03-27 NOTE — ED Provider Triage Note (Signed)
Emergency Medicine Provider Triage Evaluation Note  Wanda Wood , a 51 y.o. female  was evaluated in triage.  Pt complains of shortness of breath and flulike symptoms.  Patient reports flulike symptoms started on Tuesday.  Patient states that mother is sick with similar symptoms.  Patient reports temperature earlier this morning.  States that she went to urgent care for shortness of breath.  Patient was sent to [place] due to oxygen saturation 88 to 89% on room air.  Patient denies any chest pain, unexpected weight loss, leg swelling or tenderness, orthopnea, paroxysmal nocturnal dyspnea  Review of Systems  Positive: Fever, chills, productive cough, nasal congestion, rhinorrhea, sore throat, body aches Negative: See above  Physical Exam  BP 97/60 (BP Location: Right Arm)    Pulse 94    Temp 99.1 F (37.3 C) (Oral)    Resp 18    SpO2 97%  Gen:   Awake, no distress   Resp:  Normal effort, speaks in full complete sentences without difficulty.  Lungs clear to auscultation bilaterally MSK:   Moves extremities without difficulty  Other:    Medical Decision Making  Medically screening exam initiated at 12:02 PM.  Appropriate orders placed.  Frederick Mershon was informed that the remainder of the evaluation will be completed by another provider, this initial triage assessment does not replace that evaluation, and the importance of remaining in the ED until their evaluation is complete.  Suspect that patient's shortness of breath is related to her flulike illness.  However due to patient's cardiac history will obtain BNP and troponin as well.   Loni Beckwith, [place] 03/27/21 1203

## 2021-03-27 NOTE — ED Notes (Signed)
PT decided to leave AMA 16:04

## 2021-03-27 NOTE — ED Provider Notes (Signed)
[facility]    CSN: [id number] Arrival date & time: 03/27/21  0932      History   Chief Complaint Chief Complaint  Patient presents with   Cough    HPI Wanda Wood is a 51 y.o. female.   Patient presents with cough, fever, runny nose, shortness of breath that started approximately 2 days ago.  She reports that she has become increasingly short of breath over the past 2 days.  Patient reports that she does not have any chronic lung conditions but does report that she had a heart attack in December 2022.  Does have a productive cough with clear sputum.  Her mother has had similar symptoms recently.  Denies chest pain, sore throat, ear pain, nausea, vomiting, diarrhea, abdominal pain.   Cough  Past Medical History:  Diagnosis Date   High cholesterol    Myocardial infarction [facility])    Systolic CHF, acute [facility])     Patient Active Problem List   Diagnosis Date Noted   Ischemic cardiomyopathy 01/22/2021   CAD (coronary artery disease) 01/22/2021   Hypotension 01/21/2021   Hyperlipidemia 01/19/2021   NSTEMI (non-ST elevated myocardial infarction) ([place]) 01/18/2021    Past Surgical History:  Procedure Laterality Date   CERVICAL SPINE SURGERY     CORONARY STENT INTERVENTION N/A 01/20/2021   Procedure: CORONARY STENT INTERVENTION;  Surgeon: Lorretta Harp, MD;  Location: [place] [facility];  Service: Cardiovascular;  Laterality: N/A;   LEFT HEART CATH AND CORONARY ANGIOGRAPHY N/A 01/20/2021   Procedure: LEFT HEART CATH AND CORONARY ANGIOGRAPHY;  Surgeon: Lorretta Harp, MD;  Location: [place] [facility];  Service: Cardiovascular;  Laterality: N/A;    OB History   No obstetric history on file.      Home Medications    Prior to Admission medications   Medication Sig Start Date End Date Taking? Authorizing Provider  acetaminophen (TYLENOL) 500 MG tablet Take 500 mg by mouth every 6 (six) hours as needed for moderate pain or mild pain.    [provider]  aspirin 81 MG chewable tablet Chew 1 tablet (81 mg total) by mouth daily. 01/23/21   Cheryln Manly, NP  carvedilol (COREG) 3.125 MG tablet Take 1 tablet (3.125 mg total) by mouth 2 (two) times daily with a meal. 02/06/21   O'Neal, Cassie Freer, MD  clopidogrel (PLAVIX) 75 MG tablet Take 1 tablet (75 mg total) by mouth daily. 02/06/21   O'NealCassie Freer, MD  furosemide (LASIX) 20 MG tablet Take 1 tablet (20 mg total) by mouth daily as needed. For weight gain 3lb overnight, 5lbs in a week, shortness of breath or leg edema 02/06/21   O'Neal, Cassie Freer, MD  nitroGLYCERIN (NITROSTAT) 0.4 MG SL tablet Place 1 tablet (0.4 mg total) under the tongue every 5 (five) minutes x 3 doses as needed for chest pain. 03/21/21   Deberah Pelton, NP  rosuvastatin (CRESTOR) 40 MG tablet Take 1 tablet (40 mg total) by mouth daily. 02/06/21   O'NealCassie Freer, MD    Family History History reviewed. No pertinent family history.  Social History Social History   Tobacco Use   Smoking status: Former    Packs/day: 0.50    Years: 3.00    Pack years: 1.50    Types: Cigarettes   Smokeless tobacco: Never  Vaping Use   Vaping Use: Never used  Substance Use Topics   Alcohol use: Not Currently   Drug use: Yes  Frequency: 4.0 times per week    Types: Marijuana     Allergies   Lactose intolerance (gi), Soy allergy, and Sulfa antibiotics   Review of Systems Review of Systems Per HPI  Physical Exam Triage Vital Signs ED Triage Vitals [03/27/21 1019]  Enc Vitals Group     BP 101/70     Pulse Rate 91     Resp 16     Temp 98.6 F (37 C)     Temp Source Oral     SpO2 91 %     Weight      Height      Head Circumference      Peak Flow      Pain Score 9     Pain Loc      Pain Edu?      Excl. in [place]?    No data found.  Updated Vital Signs BP 101/70 (BP Location: Left Arm)    Pulse 91    Temp 98.6 F (37 C) (Oral)    Resp 16    SpO2 91%   Visual Acuity Right  Eye Distance:   Left Eye Distance:   Bilateral Distance:    Right Eye Near:   Left Eye Near:    Bilateral Near:     Physical Exam Constitutional:      General: She is not in acute distress.    Appearance: Normal appearance. She is not toxic-appearing or diaphoretic.  HENT:     Head: Normocephalic and atraumatic.     Right Ear: Tympanic membrane and ear canal normal.     Left Ear: Tympanic membrane and ear canal normal.     Nose: Congestion present.     Mouth/Throat:     Mouth: Mucous membranes are moist.     Pharynx: No posterior oropharyngeal erythema.  Eyes:     Extraocular Movements: Extraocular movements intact.     Conjunctiva/sclera: Conjunctivae normal.     Pupils: Pupils are equal, round, and reactive to light.  Cardiovascular:     Rate and Rhythm: Normal rate and regular rhythm.     Pulses: Normal pulses.     Heart sounds: Normal heart sounds.  Pulmonary:     Effort: Pulmonary effort is normal. No respiratory distress.     Breath sounds: Normal breath sounds. No stridor. No wheezing, rhonchi or rales.  Abdominal:     General: Abdomen is flat. Bowel sounds are normal.     Palpations: Abdomen is soft.  Musculoskeletal:        General: Normal range of motion.     Cervical back: Normal range of motion.  Skin:    General: Skin is warm and dry.  Neurological:     General: No focal deficit present.     Mental Status: She is alert and oriented to person, place, and time. Mental status is at baseline.  Psychiatric:        Mood and Affect: Mood normal.        Behavior: Behavior normal.     UC Treatments / Results  Labs (all labs ordered are listed, but only abnormal results are displayed) Labs Reviewed - No data to display  EKG   Radiology No results found.  Procedures Procedures (including critical care time)  Medications Ordered in UC Medications - No data to display  Initial Impression / Assessment and Plan / UC Course  I have reviewed the triage  vital signs and the nursing notes.  Pertinent labs & imaging  results that were available during my care of the patient were reviewed by me and considered in my medical decision making (see chart for details).     Was called to room by nursing during triage because patient's oxygen saturation was 88 to 89%.  2 L of oxygen was applied to patient.  EKG was completed showing normal sinus rhythm with possible T wave abnormality.  Patient was advised that she will need to go to the hospital for more extensive evaluation given low oxygen saturation.  Patient was agreeable with plan and left via EMS. Final Clinical Impressions(s) / UC Diagnoses   Final diagnoses:  Shortness of breath  Acute cough  Low oxygen saturation     Discharge Instructions      Patient sent to hospital via EMS.    ED Prescriptions   None    PDMP not reviewed this encounter.   Teodora Medici,  03/27/21 1059

## 2021-03-27 NOTE — ED Notes (Addendum)
Pt O2 sat dipping into the high 80s (87-90%) in triage. Placed pt on 2L O2, improved to 94-95%. Performed EKG, notified provider.   States this past weekend she was out of town, didn't take her medication because she left it at home (clopidogrel, carvedilol)

## 2021-03-27 NOTE — Discharge Instructions (Signed)
Patient sent to hospital via EMS.  

## 2021-03-28 ENCOUNTER — Encounter: Payer: Self-pay | Admitting: Cardiovascular Disease

## 2021-03-28 NOTE — Telephone Encounter (Signed)
LMTCB

## 2021-04-01 ENCOUNTER — Encounter (HOSPITAL_COMMUNITY): Payer: Self-pay

## 2021-04-01 ENCOUNTER — Other Ambulatory Visit: Payer: Self-pay

## 2021-04-01 DIAGNOSIS — E785 Hyperlipidemia, unspecified: Secondary | ICD-10-CM

## 2021-04-01 NOTE — Telephone Encounter (Signed)
Spoke with patient who reports she went to urgent care on 2/16 for fever and cough. Her O2 sat was  89-91. She was then transported to the ED and had blood work, CXR, EKG. She eloped from the ED. She told me she was tired of waiting and left before seeing the physician. Over the phone, she has a dry cough. Also, she reports having sharp pain in her left side when bending forward or to the left side. It does not hurt when she takes a deep breath or presses on it. She reports dark blue bruising on her arms and legs for the past 3 weeks. She wants to know the reason she is still taking carvedilol. Her BP ranges from 109/78 to 137/85. She wants Dr. Jenetta DownerNori Riis to go over all her medications with her. Her main concerns are the BP medication and pain on the left side. Please advise.

## 2021-04-02 LAB — LIPID PANEL
Chol/HDL Ratio: 4.6 ratio — ABNORMAL HIGH (ref 0.0–4.4)
Cholesterol, Total: 292 mg/dL — ABNORMAL HIGH (ref 100–199)
HDL: 64 mg/dL (ref >39)
LDL Chol Calc (NIH): 218 mg/dL — ABNORMAL HIGH (ref 0–99)
Triglycerides: 69 mg/dL (ref 0–149)
VLDL Cholesterol Cal: 10 mg/dL (ref 5–40)

## 2021-04-02 LAB — LDL CHOLESTEROL, DIRECT: LDL Direct: 208 mg/dL — ABNORMAL HIGH (ref 0–99)

## 2021-04-02 LAB — LIPOPROTEIN A (LPA): Lipoprotein (a): 109.2 nmol/L — ABNORMAL HIGH (ref <75.0)

## 2021-04-03 NOTE — Progress Notes (Signed)
Cardiology Office Note:   Date:  04/04/2021  NAME:  Wanda Wood    MRN: [id number] DOB:  June 05, 1970   PCP:  Oneita Hurt, No  Cardiologist:  Reatha Harps, MD  Electrophysiologist:  None   Referring MD: No ref. provider found   Chief Complaint  Patient presents with   Follow-up        History of Present Illness:   Wanda Wood is a 51 y.o. female with a hx of CAD, HLD, ischemic CM who presents for follow-up.  Seen in the emergency room on 03/25/2021.  Apparently had symptoms of sore throat as well as cough and congestion.  She also reports shortness of breath.  She was evaluated by EMS and oxygen levels were low.  Went to the emergency room but the wait was too long.  In the emergency room, troponins were negative.  BNP 33.  Chest x-ray negative.  She reports she overall is feeling better.  We stopped her carvedilol.  Blood pressure 115/70.  Pulse 80.  We discussed results of her recent cholesterol profile.  She did not tolerate Zetia.  Needs to be on something stronger.  She is wanted to get a second opinion with Dr. Algis Downs.  I agree with this.  Hopefully we can convince her that she can be on these medicines safely.  Repeat echo next week.  She would like to check her particle size.  We also will proceed with genetic testing.  Denies any chest pain.  Still getting over this cough with congestion.  Oxygen saturation is 97%.  Problem List CAD/NSTEMI -01/20/2021 -PCI to pLAD 2. Ischemic CM -EF 35-40% 3. Familial Hyperlipidemia -T chol 511, LDL 420, HDL 69, TG 110 -zetia intolerant   Past Medical History: Past Medical History:  Diagnosis Date   High cholesterol    Myocardial infarction (HCC)    Systolic CHF, acute (HCC)     Past Surgical History: Past Surgical History:  Procedure Laterality Date   CERVICAL SPINE SURGERY     CORONARY STENT INTERVENTION N/A 01/20/2021   Procedure: CORONARY STENT INTERVENTION;  Surgeon: Runell Gess, MD;  Location: [facility];  Service:  Cardiovascular;  Laterality: N/A;   LEFT HEART CATH AND CORONARY ANGIOGRAPHY N/A 01/20/2021   Procedure: LEFT HEART CATH AND CORONARY ANGIOGRAPHY;  Surgeon: Runell Gess, MD;  Location: [facility]VASIVE CV LAB;  Service: Cardiovascular;  Laterality: N/A;    Current Medications: Current Meds  Medication Sig   acetaminophen (TYLENOL) 500 MG tablet Take 500 mg by mouth every 6 (six) hours as needed for moderate pain or mild pain.   aspirin 81 MG chewable tablet Chew 1 tablet (81 mg total) by mouth daily.   clopidogrel (PLAVIX) 75 MG tablet Take 1 tablet (75 mg total) by mouth daily.   furosemide (LASIX) 20 MG tablet Take 1 tablet (20 mg total) by mouth daily as needed. For weight gain 3lb overnight, 5lbs in a week, shortness of breath or leg edema   nitroGLYCERIN (NITROSTAT) 0.4 MG SL tablet Place 1 tablet (0.4 mg total) under the tongue every 5 (five) minutes x 3 doses as needed for chest pain.   rosuvastatin (CRESTOR) 40 MG tablet Take 1 tablet (40 mg total) by mouth daily.     Allergies:    Lactose intolerance (gi), Soy allergy, Sulfa antibiotics, and Sulfites   Social History: Social History   Socioeconomic History   Marital status: Single    Spouse name: Not on file   Number  of children: 0   Years of education: Not on file   Highest education level: Not on file  Occupational History   Occupation: bartender/waitress    Comment: part time  Tobacco Use   Smoking status: Former    Packs/day: 0.50    Years: 3.00    Pack years: 1.50    Types: Cigarettes   Smokeless tobacco: Never  Vaping Use   Vaping Use: Never used  Substance and Sexual Activity   Alcohol use: Not Currently   Drug use: Yes    Frequency: 4.0 times per week    Types: Marijuana   Sexual activity: Not on file  Other Topics Concern   Not on file  Social History Narrative   Not on file   Social Determinants of Health   Financial Resource Strain: High Risk   Difficulty of Paying Living Expenses: Hard   Food Insecurity: No Food Insecurity   Worried About Running Out of Food in the Last Year: Never true   Ran Out of Food in the Last Year: Never true  Transportation Needs: No Transportation Needs   Lack of Transportation (Medical): No   Lack of Transportation (Non-Medical): No  Physical Activity: Not on file  Stress: Not on file  Social Connections: Not on file     Family History: The patient's family history is not on file.  ROS:   All other ROS reviewed and negative. Pertinent positives noted in the HPI.     EKGs/Labs/Other Studies Reviewed:   The following studies were personally reviewed by me today:  LHC 01/20/2021   Mid LAD lesion is 99% stenosed.   A drug-eluting stent was successfully placed using a STENT ONYX FRONTIER 2.25X15.   Post intervention, there is a 0% residual stenosis.   There is severe left ventricular systolic dysfunction.   LV end diastolic pressure is mildly elevated.   The left ventricular ejection fraction is 25-35% by visual estimate.  TTE 01/21/2021 1. Left ventricular ejection fraction, by estimation, is 35 to 40%. Left  ventricular ejection fraction by 3D volume is 44 %. The left ventricle has  moderately decreased function. The left ventricle demonstrates regional  wall motion abnormalities (see  scoring diagram/findings for description). Left ventricular diastolic  parameters are consistent with Grade I diastolic dysfunction (impaired  relaxation). There is severe hypokinesis of the left ventricular,  mid-apical anteroseptal wall, anterior wall and   apical segment. No left ventricular thrombus is seen (Definity was used).   2. Right ventricular systolic function is normal. The right ventricular  size is normal. There is normal pulmonary artery systolic pressure.   3. A small pericardial effusion is present. The pericardial effusion is  localized near the right ventricle.   4. The mitral valve is normal in structure. No evidence of mitral  valve  regurgitation.   5. The aortic valve is tricuspid. Aortic valve regurgitation is not  visualized. No aortic stenosis is present.   Recent Labs: 01/18/2021: Magnesium 2.1; TSH 2.255 03/27/2021: ALT 12; B Natriuretic Peptide 33.7; BUN <5; Creatinine, Ser 0.82; Hemoglobin 14.0; Platelets 235; Potassium 4.1; Sodium 139   Recent Lipid Panel    Component Value Date/Time   CHOL 292 (H) 04/01/2021 0955   TRIG 69 04/01/2021 0955   HDL 64 04/01/2021 0955   CHOLHDL 4.6 (H) 04/01/2021 0955   CHOLHDL 7.4 01/19/2021 0020   VLDL 22 01/19/2021 0020   LDLCALC 218 (H) 04/01/2021 0955   LDLDIRECT 208 (H) 04/01/2021 0955    Physical  Exam:   VS:  BP 115/70    Pulse 80    Ht 5' 4.5" (1.638 m)    Wt 145 lb 9.6 oz (66 kg)    SpO2 97%    BMI 24.61 kg/m    Wt Readings from Last 3 Encounters:  04/04/21 145 lb 9.6 oz (66 kg)  03/21/21 149 lb 6.4 oz (67.8 kg)  02/06/21 150 lb 9.6 oz (68.3 kg)    General: Well nourished, well developed, in no acute distress Head: Atraumatic, normal size  Eyes: PEERLA, EOMI  Neck: Supple, no JVD Endocrine: No thryomegaly Cardiac: Normal S1, S2; RRR; no murmurs, rubs, or gallops Lungs: Clear to auscultation bilaterally, no wheezing, rhonchi or rales  Abd: Soft, nontender, no hepatomegaly  Ext: No edema, pulses 2+ Musculoskeletal: No deformities, BUE and BLE strength normal and equal Skin: Warm and dry, no rashes   Neuro: Alert and oriented to person, place, time, and situation, CNII-XII grossly intact, no focal deficits  Psych: Normal mood and affect   ASSESSMENT:   Wanda Wood is a 51 y.o. female who presents for the following: 1. Hyperlipidemia LDL goal <70   2. Coronary artery disease involving native coronary artery of native heart without angina pectoris   3. Ischemic cardiomyopathy   4. Chronic systolic heart failure (HCC)     PLAN:   1. Hyperlipidemia LDL goal <70 2. Coronary artery disease involving native coronary artery of native heart  without angina pectoris 3. Ischemic cardiomyopathy 4. Chronic systolic heart failure (HCC) -High risk non-STEMI 01/20/2021.  Underwent PCI to the LAD.  Had an ejection fraction of 35-40% with regional wall motion normality.  LDL cholesterol was 420.  She was started on Crestor 40 mg daily.  As well as Zetia.  He is intolerant of Zetia.  LDL cholesterol still elevated around 208.  LPA also elevated.  She is very high risk for future events.  I recommended PCSK9 inhibitor therapy.  She wishes a second opinion with Dr. Italyhad Hilty.  I agree with this.  We also will proceed with genetic testing.  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  She has an echo next week.  She is intolerant of most of her heart failure medications.  I am hopeful that her heart function will be back to normal.  She can stop her carvedilol for now.  No evidence of heart failure on exam.  Her shortness of breath is improving.  I suspect she had a viral illness.  Disposition: Return in about 4 months (around 08/02/2021).  Medication Adjustments/Labs and Tests Ordered: Current medicines are reviewed at length with the patient today.  Concerns regarding medicines are outlined above.  Orders Placed This Encounter  Procedures   AMB Referral to [facility]   ECHOCARDIOGRAM COMPLETE   No orders of the defined types were placed in this encounter.   Patient Instructions  Medication Instructions:  The current medical regimen is effective;  continue present plan and medications.  *If you need a refill on your cardiac medications before your next appointment, please call your pharmacy*   Lab Work: NMR lipoprofile today   If you have labs (blood work) drawn today and your tests are completely normal, you will receive your results only by: MyChart Message (if you have MyChart) OR A paper copy in the mail If you have any lab test that is abnormal or we need to change your treatment, we will call you to review the  results.  Testing/Procedures: Echocardiogram - Your physician has requested that you have an echocardiogram. Echocardiography is a painless test that uses sound waves to create images of your heart. It provides your doctor with information about the size and shape of your heart and how well your hearts chambers and valves are working. This procedure takes approximately one hour. There are no restrictions for this procedure. This will be performed at either our [facility] location - [address], [address] -or- [place] location [employer] 2nd floor.    Follow-Up: At [facility], you and your health needs are our priority.  As part of our continuing mission to provide you with exceptional heart care, we have created designated Provider Care Teams.  These Care Teams include your primary Cardiologist (physician) and Advanced Practice Providers (APPs -  Physician Assistants and Nurse Practitioners) who all work together to provide you with the care you need, when you need it.  We recommend signing up for the patient portal called "MyChart".  Sign up information is provided on this After Visit Summary.  MyChart is used to connect with patients for Virtual Visits (Telemedicine).  Patients are able to view lab/test results, encounter notes, upcoming appointments, etc.  Non-urgent messages can be sent to your provider as well.   To learn more about what you can do with MyChart, go to [email or web address].    Your next appointment:   Keep appointment in June   The format for your next appointment:   In Person  Provider:   Reatha Harps, MD     Other Instructions Genetic testing- we will order and have this shipped to you.   Referral to Dr.Hilty- LIPID CLINIC- they will contact you to set up an appointment.    Time Spent with Patient: I have spent a total of 35 minutes with patient reviewing hospital notes, telemetry, EKGs, labs and examining the patient as well as  establishing an assessment and plan that was discussed with the patient.  > 50% of time was spent in direct patient care.  Signed, Lenna Gilford. Flora Lipps, MD, [facility]   [facility]  [address], [address] [place], [place] [address] [phone]  04/04/2021 3:46 PM

## 2021-04-04 ENCOUNTER — Ambulatory Visit: Payer: Managed Care, Other (non HMO) | Admitting: Cardiovascular Disease

## 2021-04-04 ENCOUNTER — Encounter: Payer: Self-pay | Admitting: Cardiovascular Disease

## 2021-04-04 ENCOUNTER — Other Ambulatory Visit: Payer: Self-pay

## 2021-04-04 VITALS — BP 115/70 | HR 80 | Ht 64.5 in | Wt 145.6 lb

## 2021-04-04 DIAGNOSIS — I255 Ischemic cardiomyopathy: Secondary | ICD-10-CM

## 2021-04-04 DIAGNOSIS — I5022 Chronic systolic (congestive) heart failure: Secondary | ICD-10-CM | POA: Diagnosis not present

## 2021-04-04 DIAGNOSIS — I2510 Atherosclerotic heart disease of native coronary artery without angina pectoris: Secondary | ICD-10-CM

## 2021-04-04 DIAGNOSIS — E785 Hyperlipidemia, unspecified: Secondary | ICD-10-CM | POA: Diagnosis not present

## 2021-04-04 NOTE — Patient Instructions (Signed)
Medication Instructions:  The current medical regimen is effective;  continue present plan and medications.  *If you need a refill on your cardiac medications before your next appointment, please call your pharmacy*   Lab Work: NMR lipoprofile today   If you have labs (blood work) drawn today and your tests are completely normal, you will receive your results only by: Diagonal (if you have MyChart) OR A paper copy in the mail If you have any lab test that is abnormal or we need to change your treatment, we will call you to review the results.   Testing/Procedures: Echocardiogram - Your physician has requested that you have an echocardiogram. Echocardiography is a painless test that uses sound waves to create images of your heart. It provides your doctor with information about the size and shape of your heart and how well your hearts chambers and valves are working. This procedure takes approximately one hour. There are no restrictions for this procedure. This will be performed at either our [facility] location - [address], [place] location [facility] 2nd floor.    Follow-Up: At [facility], you and your health needs are our priority.  As part of our continuing mission to provide you with exceptional heart care, we have created designated Provider Care Teams.  These Care Teams include your primary Cardiologist (physician) and Advanced Practice Providers (APPs -  Physician Assistants and Nurse Practitioners) who all work together to provide you with the care you need, when you need it.  We recommend signing up for the patient portal called "MyChart".  Sign up information is provided on this After Visit Summary.  MyChart is used to connect with patients for Virtual Visits (Telemedicine).  Patients are able to view lab/test results, encounter notes, upcoming appointments, etc.  Non-urgent messages can be sent to your provider as well.   To learn  more about what you can do with MyChart, go to [email or web address].    Your next appointment:   Keep appointment in June   The format for your next appointment:   In Person  Provider:   Evalina Field, MD     Other Instructions Genetic testing- we will order and have this shipped to you.   Referral to [facility]- they will contact you to set up an appointment.

## 2021-04-05 ENCOUNTER — Encounter: Payer: Self-pay | Admitting: Cardiovascular Disease

## 2021-04-05 LAB — NMR, LIPOPROFILE
Cholesterol, Total: 335 mg/dL — ABNORMAL HIGH (ref 100–199)
HDL Particle Number: 35.5 umol/L (ref >=30.5)
HDL-C: 70 mg/dL (ref >39)
LDL Particle Number: 1943 nmol/L — ABNORMAL HIGH (ref <1000)
LDL Size: 22.2 nm (ref >20.5)
LDL-C (NIH Calc): 249 mg/dL — ABNORMAL HIGH (ref 0–99)
LP-IR Score: <25 (ref <=45)
Small LDL Particle Number: 244 nmol/L (ref <=527)
Triglycerides: 97 mg/dL (ref 0–149)

## 2021-04-05 LAB — LDL CHOLESTEROL, DIRECT: LDL Direct: 243 mg/dL — ABNORMAL HIGH (ref 0–99)

## 2021-04-07 ENCOUNTER — Ambulatory Visit: Payer: Managed Care, Other (non HMO) | Admitting: Adult Health

## 2021-04-09 ENCOUNTER — Ambulatory Visit (HOSPITAL_COMMUNITY): Payer: Commercial Managed Care - HMO | Attending: Cardiology

## 2021-04-09 ENCOUNTER — Other Ambulatory Visit: Payer: Self-pay

## 2021-04-09 DIAGNOSIS — I5022 Chronic systolic (congestive) heart failure: Secondary | ICD-10-CM | POA: Diagnosis not present

## 2021-04-09 LAB — ECHOCARDIOGRAM COMPLETE
Area-P 1/2: 2.11 cm2
P 1/2 time: 460 msec
S' Lateral: 2.9 cm

## 2021-04-29 ENCOUNTER — Other Ambulatory Visit: Payer: Self-pay

## 2021-04-29 ENCOUNTER — Telehealth: Payer: Self-pay | Admitting: Cardiovascular Disease

## 2021-04-29 ENCOUNTER — Ambulatory Visit (INDEPENDENT_AMBULATORY_CARE_PROVIDER_SITE_OTHER): Payer: Managed Care, Other (non HMO) | Admitting: Nurse Practitioner

## 2021-04-29 ENCOUNTER — Encounter: Payer: Self-pay | Admitting: Nurse Practitioner

## 2021-04-29 VITALS — BP 90/58 | HR 59 | Temp 96.9°F | Ht 64.5 in | Wt 154.4 lb

## 2021-04-29 DIAGNOSIS — I214 Non-ST elevation (NSTEMI) myocardial infarction: Secondary | ICD-10-CM | POA: Diagnosis not present

## 2021-04-29 DIAGNOSIS — Z0000 Encounter for general adult medical examination without abnormal findings: Secondary | ICD-10-CM

## 2021-04-29 DIAGNOSIS — E785 Hyperlipidemia, unspecified: Secondary | ICD-10-CM

## 2021-04-29 DIAGNOSIS — I2510 Atherosclerotic heart disease of native coronary artery without angina pectoris: Secondary | ICD-10-CM | POA: Diagnosis not present

## 2021-04-29 HISTORY — DX: Encounter for general adult medical examination without abnormal findings: Z00.00

## 2021-04-29 LAB — TSH: TSH: 2.12 u[IU]/mL (ref 0.35–5.50)

## 2021-04-29 LAB — COMPREHENSIVE METABOLIC PANEL
ALT: 13 U/L (ref 0–35)
AST: 16 U/L (ref 0–37)
Albumin: 4.3 g/dL (ref 3.5–5.2)
Alkaline Phosphatase: 88 U/L (ref 39–117)
BUN: 9 mg/dL (ref 6–23)
CO2: 30 mEq/L (ref 19–32)
Calcium: 9.1 mg/dL (ref 8.4–10.5)
Chloride: 103 mEq/L (ref 96–112)
Creatinine, Ser: 0.75 mg/dL (ref 0.40–1.20)
GFR: 91.09 mL/min (ref >60.00)
Glucose, Bld: 97 mg/dL (ref 70–99)
Potassium: 4.3 mEq/L (ref 3.5–5.1)
Sodium: 141 mEq/L (ref 135–145)
Total Bilirubin: 0.3 mg/dL (ref 0.2–1.2)
Total Protein: 6.6 g/dL (ref 6.0–8.3)

## 2021-04-29 LAB — CBC
HCT: 37.7 % (ref 36.0–46.0)
Hemoglobin: 12.5 g/dL (ref 12.0–15.0)
MCHC: 33.3 g/dL (ref 30.0–36.0)
MCV: 90.5 fl (ref 78.0–100.0)
Platelets: 208 10*3/uL (ref 150.0–400.0)
RBC: 4.16 Mil/uL (ref 3.87–5.11)
RDW: 14.2 % (ref 11.5–15.5)
WBC: 5.6 10*3/uL (ref 4.0–10.5)

## 2021-04-29 MED ORDER — TICAGRELOR 90 MG PO TABS: 90.0000 mg | ORAL_TABLET | Freq: Two times a day (BID) | ORAL | 2 refills | Status: DC

## 2021-04-29 NOTE — Progress Notes (Signed)
? ?New Patient Office Visit ? ?Subjective:  ?Patient ID: Wanda Wood, female    DOB: 09/28/1970  Age: 51 y.o. MRN: [id number] ? ?CC:  ?Chief Complaint  ?Patient presents with  ? Establish Care  ?  Has been seen-Dr Tavoon gyn no previous PCP  ? ? ?HPI ?Wanda Wood presents for Establish care for complete physical and follow up of chronic conditions. ? ?MI: Dr. Flora Lipps Caridology. Recent MI in 01/2021. Is working on her diet.  States that she was offered cardiac rehab but politely declined after joint discussion with her provider.  Patient does currently have nitroglycerin as needed but is not currently taking antiplatelet therapy as she states it caused increasing shortness of breath.  States she has not told her cardiologist as of yet but does have an appointment coming up. ? ?Immunizations: ?-Tetanus: refused ?-Influenza: refused ?-Covid-19: refused ?-Shingles: refused ?-Pneumonia: refused ? ?-HPV: Aged out ? ?Diet: Fair diet. Eats 3 times daily. Fruit (apple and oranges), flax seed, red meat, chicken, fish, Green leaf veggies. Cut sugar out. Also cut process carbs out.  ?Exercise: No regular exercise. No hiking or walking currently, although she enjoys this. Patient was offered cardaic rehab as myocardial infarction but joint decision-making states she would be okay without it. ? ?Eye exam: Completes annually. Contacts yearly.  ?Dental exam: Needs updating  ? ?Pap Smear: Completed in GYN. Feb 2023 ?Mammogram: Completed in does not want a mammo.  States that her gynecologist does breast exam ?Colonoscopy: Completed in refused currently.  Did discuss treatment options inclusive of colonoscopy, Cologuard, and iFOB. ?Dexa: N/A ? ? ?Lung Cancer Screening: Patient did have a 4-year stent where she smoked 1 to 2 cigarettes a day but has quit since.  Does not qualify for low-dose CT scan in regards to screening for lung cancer. ? ?Sleep: States that she sleeps well. Goes to bed aroun [phone] and wakes up with the  sun. Takes garlic prior to going to sleep this helps her sleep well per patient report. ? ?PHQ9 SCORE ONLY 04/29/2021  ?PHQ-9 Total Score 0  ?  ?GAD 7 : Generalized Anxiety Score 04/29/2021  ?Nervous, Anxious, on Edge 1  ?Control/stop worrying 0  ?Worry too much - different things 0  ?Trouble relaxing 1  ?Restless 1  ?Easily annoyed or irritable 0  ?Afraid - awful might happen 0  ?Total GAD 7 Score 3  ?Anxiety Difficulty Somewhat difficult  ? ?  ? ?Past Medical History:  ?Diagnosis Date  ? 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  ? Anxiety 1998  ? Spoke with ob/gyn. Prescription for Xanax for short period.  ? Arthritis 2005  ? Chiropractor mentioned inflammation throughout my spine  ? Depression 1987  ? Depressed in high school  ? High cholesterol   ? Myocardial infarction [facility])   ? Systolic CHF, acute (HCC)   ? ? ?Past Surgical History:  ?Procedure Laterality Date  ? CERVICAL SPINE SURGERY    ? CORONARY STENT INTERVENTION N/A 01/20/2021  ? Procedure: CORONARY STENT INTERVENTION;  Surgeon: Runell Gess, MD;  Location: [facility] INVASIVE CV LAB;  Service: Cardiovascular;  Laterality: N/A;  ? LEFT HEART CATH AND CORONARY ANGIOGRAPHY N/A 01/20/2021  ? Procedure: LEFT HEART CATH AND CORONARY ANGIOGRAPHY;  Surgeon: Runell Gess, MD;  Location: [facility] INVASIVE CV LAB;  Service: Cardiovascular;  Laterality: N/A;  ? SPINE SURGERY  11/06/2010  ? Spinal fusion C5-C7  ? ? ?Family History  ?Problem Relation Age of Onset  ?  Thyroid disease Mother   ? Heart disease Father   ? Heart attack Father 72  ? Cancer Maternal Grandmother 50  ?     uterine cancer  ? ? ?Social History  ? ?Socioeconomic History  ? Marital status: Single  ?  Spouse name: Not on file  ? Number of children: 0  ? Years of education: Not on file  ? Highest education level: Not on file  ?Occupational History  ? Occupation: bartender/waitress  ?  Comment: part time  ?Tobacco Use  ? Smoking status: Former  ?  Packs/day: 0.50  ?  Years: 3.00  ?  Pack  years: 1.50  ?  Types: Cigarettes  ? Smokeless tobacco: Never  ?Vaping Use  ? Vaping Use: Never used  ?Substance and Sexual Activity  ? Alcohol use: Not Currently  ?  Comment: no more than 4 glasses of red wine a week  ? Drug use: Not Currently  ?  Frequency: 4.0 times per week  ?  Types: Marijuana  ? Sexual activity: Not Currently  ?  Birth control/protection: Post-menopausal  ?Other Topics Concern  ? Not on file  ?Social History Narrative  ? 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   ? Hobbies: reading, walker.  ? ?Social Determinants of Health  ? ?Financial Resource Strain: High Risk  ? Difficulty of Paying Living Expenses: Hard  ?Food Insecurity: No Food Insecurity  ? Worried About Programme researcher, broadcasting/film/video in the Last Year: Never true  ? Ran Out of Food in the Last Year: Never true  ?Transportation Needs: No Transportation Needs  ? Lack of Transportation (Medical): No  ? Lack of Transportation (Non-Medical): No  ?Physical Activity: Not on file  ?Stress: Not on file  ?Social Connections: Not on file  ?Intimate Partner Violence: Not on file  ? ? ?ROS ?Review of Systems  ?Constitutional:  Negative for chills, fatigue and fever.  ?Respiratory:  Negative for cough and shortness of breath (states that not since she has stop taking plavix).   ?Cardiovascular:  Negative for chest pain.  ?Gastrointestinal:  Negative for diarrhea, nausea and vomiting.  ?     BM daily ?  ?Genitourinary:  Negative for difficulty urinating, dysuria and hematuria.  ?     Nocturia x1 intermittently   ?Neurological:  Negative for dizziness, light-headedness and headaches.  ?Psychiatric/Behavioral:  Negative for hallucinations and suicidal ideas.   ? ?Objective:  ? ?Today's Vitals: BP (!) 90/58   Pulse (!) 59   Temp (!) 96.9 ?F (36.1 ?C)   Ht 5' 4.5" (1.638 m)   Wt 154 lb 6 oz (70 kg)   SpO2 98%   BMI 26.09 kg/m?  ? ?Physical Exam ?Constitutional:   ?   Appearance: Normal appearance.  ?HENT:  ?   Right Ear: Tympanic membrane, ear canal and  external ear normal.  ?   Left Ear: Tympanic membrane, ear canal and external ear normal.  ?   Mouth/Throat:  ?   Mouth: Mucous membranes are moist.  ?   Pharynx: Oropharynx is clear.  ?Eyes:  ?   Extraocular Movements: Extraocular movements intact.  ?   Pupils: Pupils are equal, round, and reactive to light.  ?Neck:  ?   Thyroid: No thyroid mass, thyromegaly or thyroid tenderness.  ?Cardiovascular:  ?   Rate and Rhythm: Normal rate and regular rhythm.  ?   Heart sounds: Normal heart sounds.  ?Pulmonary:  ?   Effort: Pulmonary effort is normal.  ?  Breath sounds: Normal breath sounds.  ?Abdominal:  ?   General: Bowel sounds are normal. There is no distension.  ?   Palpations: There is no mass.  ?   Tenderness: There is no abdominal tenderness.  ?   Hernia: No hernia is present.  ?Musculoskeletal:  ?   Right lower leg: No edema.  ?   Left lower leg: No edema.  ?Lymphadenopathy:  ?   Cervical: No cervical adenopathy.  ?Skin: ?   General: Skin is warm.  ?Neurological:  ?   General: No focal deficit present.  ?   Mental Status: She is alert.  ?   Deep Tendon Reflexes:  ?   Reflex Scores: ?     Bicep reflexes are 2+ on the right side and 2+ on the left side. ?     Patellar reflexes are 2+ on the right side and 2+ on the left side. ?   Comments: Bilateral upper and lower extremity 5/5  ?Psychiatric:     ?   Mood and Affect: Mood normal.     ?   Behavior: Behavior normal.     ?   Thought Content: Thought content normal.     ?   Judgment: Judgment normal.  ? ? ?Assessment & Plan:  ? ?Problem List Items Addressed This Visit   ? ?  ? Cardiovascular and Mediastinum  ? NSTEMI (non-ST elevated myocardial infarction) (HCC)  ?  Currently managed and followed by cardiology Dr. Bufford Buttner'Neill ?  ?  ? CAD (coronary artery disease)  ?  Patient recently had a myocardial infarction December 2022.  She is status post cardiac stent and followed by Dr. Bufford Buttner'Neill from cardiology. ?  ?  ?  ? Other  ? Hyperlipidemia  ?  Was being managed and followed  by cardiologist Dr. Carmon Ginsberg. Neil.  Patient is hesitant to do statin medication or PSK 9 inhibitors.  Per recent cardiology note patient has been referred to Dr. Rennis GoldenHilty a lipid specialist for further evaluation ?  ?  ? Pre

## 2021-04-29 NOTE — Assessment & Plan Note (Signed)
Patient recently had a myocardial infarction December 2022.  She is status post cardiac stent and followed by Dr. Bufford Buttner from cardiology. ?

## 2021-04-29 NOTE — Patient Instructions (Signed)
Nice to see you today ?I will be in touch with your lab results ?Follow up with me in 1 year, sooner if you need me ?

## 2021-04-29 NOTE — Telephone Encounter (Signed)
MD spoke with patient, verbal orders to start Brilinta 90 mg BID.  ?RX sent to pharmacy.  ? ?

## 2021-04-29 NOTE — Telephone Encounter (Signed)
Notified by primary care physician that Ms. Wanda Wood has stopped taking her Plavix.  Very important she continues this for 1 year given recent non-STEMI.  She did not answer.  I left a message for her to call us back.  We need to sort this out. ? ?Gerri Spore T. Flora Lipps, MD, [facility] ?[place]  [facility]  ?[address], [address] ?[place], [place] [address] ?([phone]  ?10:58 AM ? ?

## 2021-04-29 NOTE — Assessment & Plan Note (Signed)
Did discuss age-appropriate immunizations and screening exams.  Orders placed that were appropriate and patient in agreements with. ?

## 2021-04-29 NOTE — Assessment & Plan Note (Signed)
Was being managed and followed by cardiologist Dr. Carmon Ginsberg.  Patient is hesitant to do statin medication or PSK 9 inhibitors.  Per recent cardiology note patient has been referred to Dr. Rennis Golden a lipid specialist for further evaluation ?

## 2021-04-29 NOTE — Assessment & Plan Note (Signed)
Currently managed and followed by cardiology Dr. Bufford Buttner ?

## 2021-05-01 ENCOUNTER — Ambulatory Visit: Payer: Managed Care, Other (non HMO) | Admitting: Cardiovascular Disease

## 2021-05-02 ENCOUNTER — Telehealth (HOSPITAL_COMMUNITY): Payer: Self-pay

## 2021-05-02 NOTE — Telephone Encounter (Signed)
Per pt PCP Mordecai Maes, " States that she was offered cardiac rehab but politely declined after joint discussion with her provider." Pt seen PCP on 3/21. ?  ?Closed referral ?

## 2021-05-14 ENCOUNTER — Ambulatory Visit: Payer: Self-pay | Admitting: Cardiovascular Disease

## 2021-06-05 ENCOUNTER — Encounter: Payer: Self-pay | Admitting: Cardiovascular Disease

## 2021-06-05 NOTE — Telephone Encounter (Signed)
Lm to call back ./cy 

## 2021-06-12 LAB — T4, FREE: Free T4: 1.16

## 2021-06-12 LAB — T3, FREE: T3, Free: 2.2

## 2021-06-12 LAB — THYROID PEROXIDASE (TPO) AB: Thyroid Peroxidase Ab: <9

## 2021-06-12 LAB — TSH: TSH: 1.23 (ref 0.41–5.90)

## 2021-06-24 ENCOUNTER — Encounter: Payer: Self-pay | Admitting: Cardiovascular Disease

## 2021-06-27 ENCOUNTER — Encounter: Payer: Self-pay | Admitting: Cardiovascular Disease

## 2021-06-27 MED ORDER — PRASUGREL HCL 10 MG PO TABS: 10.0000 mg | ORAL_TABLET | Freq: Every day | ORAL | 3 refills | Status: DC

## 2021-06-27 NOTE — Telephone Encounter (Signed)
Called patient in regards to MyChart message  She had cath/stent in 01/2021 - recommended DAPT for 12 months She reports she stopped Brilinta 1 week ago d/t SE - she said she gets SOB, heavy bruising all over her body, GI problems. She also states her O2 dropped to 93% on this medication and is requesting supplemental oxygen if she is to remain on this medication.   She also reports taking Plavix with similar SE  She said she googled that Xarelto would be an option but explained this is an anticoagulant and difference class of meds  She is aware she needs to be on some sort of medication. She is also concerned with cost  Advised will route to MD to review Is Effient an option?

## 2021-06-27 NOTE — Telephone Encounter (Signed)
Sande Rives, MD  You; Darene Lamer, LPN 26 minutes ago (11:01 AM)   Lets go with effient.   Gerri Spore T. Flora Lipps, MD, [facility]  [facility]  [address], [address]  [place], [place] [address]  [phone]  11:01 AM

## 2021-06-30 ENCOUNTER — Encounter: Payer: Self-pay | Admitting: Obstetrics and Gynecology

## 2021-07-01 ENCOUNTER — Ambulatory Visit: Payer: Self-pay | Admitting: Internal Medicine

## 2021-07-03 ENCOUNTER — Encounter (HOSPITAL_COMMUNITY): Payer: Self-pay

## 2021-07-03 ENCOUNTER — Other Ambulatory Visit: Payer: Self-pay

## 2021-07-03 ENCOUNTER — Emergency Department (HOSPITAL_COMMUNITY)
Admission: EM | Admit: 2021-07-03 | Discharge: 2021-07-03 | Disposition: A | Discharge status: Home / Self Care | Payer: Commercial Managed Care - HMO | Attending: Emergency Medicine | Admitting: Emergency Medicine

## 2021-07-03 DIAGNOSIS — S79921A Unspecified injury of right thigh, initial encounter: Secondary | ICD-10-CM | POA: Diagnosis present

## 2021-07-03 DIAGNOSIS — Z7982 Long term (current) use of aspirin: Secondary | ICD-10-CM | POA: Diagnosis not present

## 2021-07-03 DIAGNOSIS — S71151A Open bite, right thigh, initial encounter: Secondary | ICD-10-CM | POA: Diagnosis not present

## 2021-07-03 DIAGNOSIS — W540XXA Bitten by dog, initial encounter: Secondary | ICD-10-CM | POA: Diagnosis not present

## 2021-07-03 DIAGNOSIS — Z23 Encounter for immunization: Secondary | ICD-10-CM | POA: Insufficient documentation

## 2021-07-03 MED ORDER — AMOXICILLIN-POT CLAVULANATE 875-125 MG PO TABS: 1.0000 | ORAL_TABLET | Freq: Two times a day (BID) | ORAL | 0 refills | Status: AC

## 2021-07-03 MED ORDER — ACETAMINOPHEN 500 MG PO TABS
1000.0000 mg | ORAL_TABLET | Freq: Once | ORAL | Status: DC
  Filled 2021-07-03: qty 2

## 2021-07-03 MED ORDER — TETANUS-DIPHTH-ACELL PERTUSSIS 5-2.5-18.5 LF-MCG/0.5 IM SUSY
0.5000 mL | PREFILLED_SYRINGE | Freq: Once | INTRAMUSCULAR | Status: AC
  Administered 2021-07-03: 0.5 mL via INTRAMUSCULAR
  Filled 2021-07-03: qty 0.5

## 2021-07-03 NOTE — ED Provider Notes (Signed)
[facility] EMERGENCY DEPARTMENT Provider Note   CSN: [id number] Arrival date & time: 07/03/21  1148     History  Chief Complaint  Patient presents with   Animal Bite    Wanda Wood is a 51 y.o. female.  51 year old female with no past medical history presents to the ED status post dog bite.  Patient reports she was riding her bike, when suddenly her neighbor Tina's dog came at her, bit her around the right upper thigh.  Endorses pain to the area, she said the dog was latching onto her right leg for several minutes.  When her neighbor drove up to the driveway to remove the dog.  She is unsure whether the dog is up-to-date with all her vaccinations, however they did notify animal control.  We personally attempted to call neighbor while in the room.  Last tetanus immunization is unknown.  Denies any other injury or complaint.  The history is provided by the patient and a parent.  Animal Bite Contact animal:  Dog Location:  Leg Leg injury location:  R upper leg Time since incident:  2 hours Pain details:    Quality:  Aching   Severity:  Moderate   Timing:  Constant Associated symptoms: no fever       Home Medications Prior to Admission medications   Medication Sig Start Date End Date Taking? Authorizing Provider  amoxicillin-clavulanate (AUGMENTIN) 875-125 MG tablet Take 1 tablet by mouth every 12 (twelve) hours for 7 days. 07/03/21 07/10/21 Yes Cortez Steelman, Leonie Douglas, PA-C  acetaminophen (TYLENOL) 500 MG tablet Take 500 mg by mouth every 6 (six) hours as needed for moderate pain or mild pain.    [provider]  aspirin 81 MG chewable tablet Chew 1 tablet (81 mg total) by mouth daily. 01/23/21   Arty Baumgartner, NP  furosemide (LASIX) 20 MG tablet Take 1 tablet (20 mg total) by mouth daily as needed. For weight gain 3lb overnight, 5lbs in a week, shortness of breath or leg edema 02/06/21   O'Neal, Ronnald Ramp, MD  nitroGLYCERIN (NITROSTAT) 0.4 MG SL tablet  Place 1 tablet (0.4 mg total) under the tongue every 5 (five) minutes x 3 doses as needed for chest pain. 03/21/21   Ronney Asters, NP  prasugrel (EFFIENT) 10 MG TABS tablet Take 1 tablet (10 mg total) by mouth daily. 06/27/21 06/22/22  O'NealRonnald Ramp, MD      Allergies    Lactose intolerance (gi), Soy allergy, Sulfa antibiotics, and Sulfites    Review of Systems   Review of Systems  Constitutional:  Negative for chills and fever.  HENT:  Negative for sore throat.   Respiratory:  Negative for shortness of breath.   Cardiovascular:  Negative for chest pain.  Gastrointestinal:  Negative for abdominal pain, nausea and vomiting.  Genitourinary:  Negative for flank pain.  Skin:  Positive for wound.  All other systems reviewed and are negative.  Physical Exam Updated Vital Signs BP (!) 127/113 (BP Location: Left Arm)   Pulse 76   Temp 98.3 F (36.8 C) (Oral)   Resp 17   Ht 5' 4.5" (1.638 m)   Wt 64.4 kg   SpO2 98%   BMI 24.00 kg/m  Physical Exam Vitals and nursing note reviewed.  Constitutional:      Appearance: Normal appearance.  HENT:     Head: Normocephalic and atraumatic.     Nose: Nose normal.  Cardiovascular:     Rate and Rhythm: Normal rate.  Pulmonary:     Effort: Pulmonary effort is normal.  Abdominal:     General: Abdomen is flat.  Musculoskeletal:     Cervical back: Normal range of motion and neck supple.  Skin:    General: Skin is warm.     Findings: Bruising and erythema present.     Comments: 7 x 7 cm hematoma to the right upper thigh, please see photos attached.   Neurological:     Mental Status: She is alert and oriented to person, place, and time.         ED Results / Procedures / Treatments   Labs (all labs ordered are listed, but only abnormal results are displayed) Labs Reviewed - No data to display  EKG None  Radiology No results found.  Procedures Procedures    Medications Ordered in ED Medications  acetaminophen  (TYLENOL) tablet 1,000 mg (1,000 mg Oral Not Given 07/03/21 1307)  Tdap (BOOSTRIX) injection 0.5 mL (0.5 mLs Intramuscular Given 07/03/21 1227)    ED Course/ Medical Decision Making/ A&P                           Medical Decision Making Risk OTC drugs. Prescription drug management.    Patient presents to the ED with a chief complaint of dog bite x2 hours ago.  Patient reports riding her bike, suddenly her neighbors dog bit the right upper thigh.  Pain to the area, the dog was latched onto her leg for a couple of minutes.  Tetanus immunization was updated while waiting for triage.  We are unsure of the rabies status on today's visit.  Normal control has been notified by patient along with family.  1:14 PM Spoke to [employer], [employer] who reported dog had Rabies shot expired in February of 2023.Inetta Fermo I explained that the dog was up-to-date with vaccines however she did not have the paperwork to prove this misplaced the paperwork and will not have it in until tomorrow.   I discussed this with patient and mother at the bedside, I did discuss with them that if she needs rabies vaccines she will need to return within 24 hours to have these placed at a urgent care.  Patient and mother are agreeable to plan and treatment.   Portions of this note were generated with Scientist, clinical (histocompatibility and immunogenetics). Dictation errors may occur despite best attempts at proofreading.   Final Clinical Impression(s) / ED Diagnoses Final diagnoses:  Dog bite, initial encounter    Rx / DC Orders ED Discharge Orders          Ordered    amoxicillin-clavulanate (AUGMENTIN) 875-125 MG tablet  Every 12 hours        07/03/21 1249              Claude Manges, PA-C 07/03/21 1337    Long, Arlyss Repress, MD 07/06/21 1312

## 2021-07-03 NOTE — Discharge Instructions (Addendum)
You will need to go to urgent care to obtain rabies vaccines if the dog who bit you if not up to date with tetanus vaccines.    You were given a prescription for antibiotics thinners to help prevent any further infection after your dog bite.  Please take 1 tablet twice a day for the next 7 days.  May continue to place ice to the wound to help with decrease in swelling.    Tetanus immunization was updated on today's visit.

## 2021-07-03 NOTE — ED Triage Notes (Signed)
Pt arrived POV from home c/o a dog bite. Pt states she was riding her bike and the neighbors dog started chasing her and latched onto her leg. Pt has a puncture wound to the right upper thigh on the outer side. Pt is not sure if the dog is up to date on vaccines. Pt is on a blood thinner.

## 2021-07-03 NOTE — ED Provider Triage Note (Signed)
Emergency Medicine Provider Triage Evaluation Note  Wanda Wood , a 51 y.o. female  was evaluated in triage.  Pt complains of a dog bite to the right lateral thigh.  Patient was riding her bike when the neighbors dog latched onto her thigh.  Patient does take Effient secondary to an MI.  Tetanus is not up-to-date and vaccine status of the dog is unknown.  Review of Systems  Positive: See above Negative:   Physical Exam  BP (!) 127/113 (BP Location: Left Arm)   Pulse 76   Temp 98.3 F (36.8 C) (Oral)   Resp 17   Ht 5' 4.5" (1.638 m)   Wt 64.4 kg   SpO2 98%   BMI 24.00 kg/m  Gen:   Awake, no distress   Resp:  Normal effort  MSK:   Moves extremities without difficulty  Other:  Large developing hematoma with central puncture wounds.  Medical Decision Making  Medically screening exam initiated at 11:59 AM.  Appropriate orders placed.  Wanda Wood was informed that the remainder of the evaluation will be completed by another provider, this initial triage assessment does not replace that evaluation, and the importance of remaining in the ED until their evaluation is complete.     [name], PA-C 07/03/21 1200

## 2021-07-06 ENCOUNTER — Encounter: Payer: Self-pay | Admitting: Cardiovascular Disease

## 2021-07-07 NOTE — Progress Notes (Unsigned)
Cardiology Office Note:   Date:  07/08/2021  NAME:  Wanda Wood    MRN: [id number] DOB:  11-Feb-1970   PCP:  Michela Pitcher, NP  Cardiologist:  Evalina Field, MD  Electrophysiologist:  None   Referring MD: Michela Pitcher, NP   Chief Complaint  Patient presents with   Follow-up        History of Present Illness:   Wanda Wood is a 51 y.o. female with a hx of CAD, HLD who presents for follow-up.  Reports she is back on Crestor for hyperlipidemia.  She is more amenable to PCSK9 inhibitor therapy.  We will refer her to a pharmacy.  She also has an appointment to meet with Dr. Debara Pickett.  Her blood pressure is well controlled.  She was bitten by a dog over the weekend.  Apparently she was riding her bike.  She has been placed on antibiotics.  She has some bruising but no bleeding.  Currently on aspirin and Effient.  She is tolerating Effient well.  Denies any chest pain or trouble breathing.  I am glad to hear that she is more amendable to more lipid-lowering agents.  Problem List CAD/NSTEMI -01/20/2021 -PCI to pLAD 2. Ischemic CM with recovery of EF -EF 35-40% -EF 55-60% 04/09/2021 3. 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   Anxiety 1998   Spoke with ob/gyn. Prescription for Xanax for short period.   Arthritis 2005   Chiropractor mentioned inflammation throughout my spine   Depression 1987   Depressed in high school   High cholesterol    Myocardial infarction ([place])    Systolic CHF, acute ([place])     Past Surgical History: Past Surgical History:  Procedure Laterality Date   CERVICAL SPINE SURGERY     CORONARY STENT INTERVENTION N/A 01/20/2021   Procedure: CORONARY STENT INTERVENTION;  Surgeon: Lorretta Harp, MD;  Location: [place] CV [facility];  Service: Cardiovascular;  Laterality: N/A;   LEFT HEART CATH AND  CORONARY ANGIOGRAPHY N/A 01/20/2021   Procedure: LEFT HEART CATH AND CORONARY ANGIOGRAPHY;  Surgeon: Lorretta Harp, MD;  Location: [place] CV [facility];  Service: Cardiovascular;  Laterality: N/A;   SPINE SURGERY  11/06/2010   Spinal fusion C5-C7    Current Medications: Current Meds  Medication Sig   acetaminophen (TYLENOL) 500 MG tablet Take 500 mg by mouth every 6 (six) hours as needed for moderate pain or mild pain.   amoxicillin-clavulanate (AUGMENTIN) 875-125 MG tablet Take 1 tablet by mouth every 12 (twelve) hours for 7 days.   aspirin 81 MG chewable tablet Chew 1 tablet (81 mg total) by mouth daily.   carvedilol (COREG) 3.125 MG tablet Take 3.125 mg by mouth 2 (two) times daily.   furosemide (LASIX) 20 MG tablet Take 1 tablet (20 mg total) by mouth daily as needed. For weight gain 3lb overnight, 5lbs in a week, shortness of breath or leg edema   nitroGLYCERIN (NITROSTAT) 0.4 MG SL tablet Place 1 tablet (0.4 mg total) under the tongue every 5 (five) minutes x 3 doses as needed for chest pain.   prasugrel (EFFIENT) 10 MG TABS tablet Take 1 tablet (10 mg total) by mouth daily.   rosuvastatin (CRESTOR) 40 MG tablet Take 40 mg by mouth daily.     Allergies:    Lactose intolerance (  gi), Soy allergy, Sulfa antibiotics, and Sulfites   Social History: Social History   Socioeconomic History   Marital status: Single    Spouse name: Not on file   Number of children: 0   Years of education: Not on file   Highest education level: Not on file  Occupational History   Occupation: bartender/waitress    Comment: part time  Tobacco Use   Smoking status: Former    Packs/day: 0.50    Years: 3.00    Pack years: 1.50    Types: Cigarettes   Smokeless tobacco: Never  Vaping Use   Vaping Use: Never used  Substance and Sexual Activity   Alcohol use: Not Currently    Comment: no more than 4 glasses of red wine a week   Drug use: Not Currently    Frequency: 4.0 times per week    Types:  Marijuana   Sexual activity: Not Currently    Birth control/protection: Post-menopausal  Other Topics Concern   Not on file  Social History Narrative   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   Social Determinants of Health   Financial Resource Strain: High Risk   Difficulty of Paying Living Expenses: Hard  Food Insecurity: No Food Insecurity   Worried About Charity fundraiser in the Last Year: Never true   Ran Out of Food in the Last Year: Never true  Transportation Needs: No Transportation Needs   Lack of Transportation (Medical): No   Lack of Transportation (Non-Medical): No  Physical Activity: Not on file  Stress: Not on file  Social Connections: Not on file     Family History: The patient's family history includes Cancer (age of onset: 50) in her maternal grandmother; Heart attack (age of onset: 37) in her father; Heart disease in her father; Thyroid disease in her mother.  ROS:   All other ROS reviewed and negative. Pertinent positives noted in the HPI.     EKGs/Labs/Other Studies Reviewed:   The following studies were personally reviewed by me today:   TTE 04/09/2021  1. Left ventricular ejection fraction, by estimation, is 55 to 60%. Left  ventricular ejection fraction by 3D volume is 59 %. The left ventricle has  normal function. The left ventricle has no regional wall motion  abnormalities. Left ventricular diastolic   parameters were normal.   2. Right ventricular systolic function is normal. The right ventricular  size is normal. There is normal pulmonary artery systolic pressure. The  estimated right ventricular systolic pressure is 123456 mmHg.   3. The mitral valve is normal in structure. Trivial mitral valve  regurgitation. No evidence of mitral stenosis.   4. The aortic valve is tricuspid. Aortic valve regurgitation is mild.  Aortic valve sclerosis is present, with no evidence of aortic valve  stenosis. Aortic regurgitation  PHT measures 460 msec.   5. The inferior vena cava is normal in size with greater than 50%  respiratory variability, suggesting right atrial pressure of 3 mmHg.   6. Compared to study dated 01/21/2021, LVF has normalized.   Recent Labs: 01/18/2021: Magnesium 2.1 03/27/2021: B Natriuretic Peptide 33.7 04/29/2021: ALT 13; BUN 9; Creatinine, Ser 0.75; Hemoglobin 12.5; Platelets 208.0; Potassium 4.3; Sodium 141 06/12/2021: TSH 1.23   Recent Lipid Panel    Component Value Date/Time   CHOL 292 (H) 04/01/2021 0955   TRIG 69 04/01/2021 0955   HDL 64 04/01/2021 0955   CHOLHDL 4.6 (H) 04/01/2021  0955   CHOLHDL 7.4 01/19/2021 0020   VLDL 22 01/19/2021 0020   LDLCALC 218 (H) 04/01/2021 0955   LDLDIRECT 243 (H) 04/04/2021 1542    Physical Exam:   VS:  BP 115/72   Pulse 63   Ht 5\' 4"  (1.626 m)   Wt 148 lb 3.2 oz (67.2 kg)   SpO2 96%   BMI 25.44 kg/m    Wt Readings from Last 3 Encounters:  07/08/21 148 lb 3.2 oz (67.2 kg)  07/03/21 142 lb (64.4 kg)  04/29/21 154 lb 6 oz (70 kg)    General: Well nourished, well developed, in no acute distress Head: Atraumatic, normal size  Eyes: PEERLA, EOMI  Neck: Supple, no JVD Endocrine: No thryomegaly Cardiac: Normal S1, S2; RRR; no murmurs, rubs, or gallops Lungs: Clear to auscultation bilaterally, no wheezing, rhonchi or rales  Abd: Soft, nontender, no hepatomegaly  Ext: No edema, pulses 2+ Musculoskeletal: No deformities, BUE and BLE strength normal and equal Skin: Warm and dry, no rashes   Neuro: Alert and oriented to person, place, time, and situation, CNII-XII grossly intact, no focal deficits  Psych: Normal mood and affect   ASSESSMENT:   Mccayla Kellough is a 51 y.o. female who presents for the following: 1. Coronary artery disease involving native coronary artery of native heart without angina pectoris   2. Hyperlipidemia LDL goal <70   3. Ischemic cardiomyopathy     PLAN:   1. Coronary artery disease involving native coronary  artery of native heart without angina pectoris 2. Hyperlipidemia LDL goal <70 3. Ischemic cardiomyopathy -Severely elevated cholesterol levels.  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  Not wanting to do genetic testing.  Was off her Crestor and pursuing natural methods but more amendable to going back on Crestor 40 mg daily.  She regardless would not get to an acceptable level.  I recommended PCSK9 inhibitor therapy.  She is okay to do this.  She is on aspirin and Effient.  She did not tolerate Brilinta or Plavix.  Should continue this for 1 year post PCI.  No symptoms of angina.  Ejection fraction was 35% but has recovered.  She will meet with Dr. Debara Pickett as well to discuss lipid-lowering agents.  Hopefully he will agree with PCSK9 inhibitor therapy.  I believe she would merit genetic testing but not wanting to do this at this time.  Regardless she needs aggressive LDL reduction.  She is more amendable to this today.      Disposition: Return in about 4 months (around 11/08/2021).  Medication Adjustments/Labs and Tests Ordered: Current medicines are reviewed at length with the patient today.  Concerns regarding medicines are outlined above.  No orders of the defined types were placed in this encounter.  No orders of the defined types were placed in this encounter.   Patient Instructions  Medication Instructions:  The current medical regimen is effective;  continue present plan and medications.  *If you need a refill on your cardiac medications before your next appointment, please call your pharmacy*  Follow-Up: At [facility], you and your health needs are our priority.  As part of our continuing mission to provide you with exceptional heart care, we have created designated Provider Care Teams.  These Care Teams include your primary Cardiologist (physician) and Advanced Practice Providers (APPs -  Physician Assistants and Nurse Practitioners) who all work together to provide you with  the care you need, when you need it.  We recommend signing up for the patient  portal called "MyChart".  Sign up information is provided on this After Visit Summary.  MyChart is used to connect with patients for Virtual Visits (Telemedicine).  Patients are able to view lab/test results, encounter notes, upcoming appointments, etc.  Non-urgent messages can be sent to your provider as well.   To learn more about what you can do with MyChart, go to [email or web address].    Your next appointment:   4 month(s)  The format for your next appointment:   In Person  Provider:   Evalina Field, MD     Other Instructions -Please make an appointment to see PHARMD to discuss Repatha- thanks! -Keep appointment with Dr.Hilty           Time Spent with Patient: I have spent a total of 25 minutes with patient reviewing hospital notes, telemetry, EKGs, labs and examining the patient as well as establishing an assessment and plan that was discussed with the patient.  > 50% of time was spent in direct patient care.  Signed, Addison Naegeli. Audie Box, MD, [place]  [address] [place], [place] [address] [phone]  07/08/2021 2:09 PM

## 2021-07-08 ENCOUNTER — Ambulatory Visit (INDEPENDENT_AMBULATORY_CARE_PROVIDER_SITE_OTHER): Payer: Commercial Managed Care - HMO | Admitting: Cardiovascular Disease

## 2021-07-08 ENCOUNTER — Encounter: Payer: Self-pay | Admitting: Cardiovascular Disease

## 2021-07-08 VITALS — BP 115/72 | HR 63 | Ht 64.0 in | Wt 148.2 lb

## 2021-07-08 DIAGNOSIS — E785 Hyperlipidemia, unspecified: Secondary | ICD-10-CM | POA: Diagnosis not present

## 2021-07-08 DIAGNOSIS — I255 Ischemic cardiomyopathy: Secondary | ICD-10-CM

## 2021-07-08 DIAGNOSIS — I5022 Chronic systolic (congestive) heart failure: Secondary | ICD-10-CM

## 2021-07-08 DIAGNOSIS — I2510 Atherosclerotic heart disease of native coronary artery without angina pectoris: Secondary | ICD-10-CM

## 2021-07-08 NOTE — Patient Instructions (Signed)
Medication Instructions:  The current medical regimen is effective;  continue present plan and medications.  *If you need a refill on your cardiac medications before your next appointment, please call your pharmacy*  Follow-Up: At [facility], you and your health needs are our priority.  As part of our continuing mission to provide you with exceptional heart care, we have created designated Provider Care Teams.  These Care Teams include your primary Cardiologist (physician) and Advanced Practice Providers (APPs -  Physician Assistants and Nurse Practitioners) who all work together to provide you with the care you need, when you need it.  We recommend signing up for the patient portal called "MyChart".  Sign up information is provided on this After Visit Summary.  MyChart is used to connect with patients for Virtual Visits (Telemedicine).  Patients are able to view lab/test results, encounter notes, upcoming appointments, etc.  Non-urgent messages can be sent to your provider as well.   To learn more about what you can do with MyChart, go to [email or web address].    Your next appointment:   4 month(s)  The format for your next appointment:   In Person  Provider:   Reatha Harps, MD     Other Instructions -Please make an appointment to see PHARMD to discuss Repatha- thanks! -Keep appointment with Dr.Hilty

## 2021-07-09 ENCOUNTER — Telehealth: Payer: Self-pay | Admitting: Cardiovascular Disease

## 2021-07-09 NOTE — Telephone Encounter (Signed)
Pt called stating she wanted to make Dr. Flora Lipps aware that she's been experiencing "adrenaline surge" that causes her blood pressure to increase. She state she's had them all her life but haven't been able to put in words until she experienced another episode yesterday after her visit. She state symptoms usually happen mid day to where she gets an urge to grab something to break, and becomes mad over nothing. Pt state she is concerned because her blood pressure increased to 132/108. She report she took an extra carvedilol, then 30 mins later took a nitro despite not having chest pain. Pt state BP decreased to 106/69 and this morning 104/66. HR averaging around 66.  Will forward to MD to make aware

## 2021-07-09 NOTE — Telephone Encounter (Signed)
Patient calling in wanting to speak to the dr. She stated when she was in the office on yesterday her bp was fine, but once she left the office she got in an argument and her bp went to 132/108. She states this is the stuff that she was trying to explain on her dr visit on yesterday.

## 2021-07-10 ENCOUNTER — Other Ambulatory Visit: Payer: Self-pay

## 2021-07-10 DIAGNOSIS — I5022 Chronic systolic (congestive) heart failure: Secondary | ICD-10-CM

## 2021-07-14 ENCOUNTER — Telehealth (HOSPITAL_COMMUNITY): Payer: Self-pay

## 2021-07-14 ENCOUNTER — Ambulatory Visit (INDEPENDENT_AMBULATORY_CARE_PROVIDER_SITE_OTHER): Payer: Commercial Managed Care - HMO | Admitting: Pharmacist Clinician (PhC)/ Clinical Pharmacy Specialist

## 2021-07-14 DIAGNOSIS — E785 Hyperlipidemia, unspecified: Secondary | ICD-10-CM

## 2021-07-14 NOTE — Assessment & Plan Note (Signed)
Patient with familial hyperlipidemia (LDL 420 at time of MI), now at 249 on rosuvastatin 40 mg.  Had previously failed ezetimibe secondary to GI distress.  Reviewed options for lowering LDL cholesterol, including ezetimibe, PCSK-9 inhibitors, bempedoic acid and inclisiran.  Discussed mechanisms of action, dosing, side effects and potential decreases in LDL cholesterol.  Also reviewed cost information and potential options for patient assistance.  Answered all patient questions.  Based on this information, patient would prefer to start PCSK-9i.  Will start process to get Repatha 140 mg Sureclick approved by insurance.  Once approved she will need to do one injection every 14 days and repeat labs

## 2021-07-14 NOTE — Progress Notes (Unsigned)
07/14/2021 Wanda Wood 1970/12/30 [id number]   HPI:  Wanda Wood is a 51 y.o. female patient of Dr Flora Lipps, who presents today for a lipid clinic evaluation.  See pertinent past medical history below.  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  She was first seen by [facility] in December when hospitalized for NSTEMI.  At that time her LDL cholesterol was 420 (total 511).  She had some issues with antiplatelet medications and is currently on prasugrel 10 mg daily.   Wanda Wood notes that she was first diagnosed with elevated cholesterol at age 40, by her recall her total cholesterol at that time was 496.  She was on atorvastatin for 3-4 months, but unfortunately it caused ongoing diarrhea and GI issues.  She has done better with rosuvastatin, but it does cause occasional constipation.    Past Medical History: CAD S/P NSTEMI; DES to mid LAD (99% stenosed)  hypertension Notices elevations in diastolic when having "adrenalin surges", otherwise more commonly hypotensive   Current Medications: rosuvastatin 40 mg daily  Cholesterol Goals: LDL < 70   Intolerant/previously tried: atorvastatin - diarrhea  Family history: pgf probably had elevated - MI at 67 and died, dad had MI at 38, died years later;  mother has no heart history, now 88 with only thyroid issues; no siblings, no children  Diet: cut out processed foods for the most part, trying to go more natural, adding more chicken and fish; mostly paleo diet; rare rice and pasta, tries to avoid processed sugars  Exercise:  walks most days (got bit by dog recently and fell - see ED note)  Labs: 2/23:  TC 335, TG 97, HDL 70, LDL 249  LDL 420 at time of event   Current Outpatient Medications  Medication Sig Dispense Refill   acetaminophen (TYLENOL) 500 MG tablet Take 500 mg by mouth every 6 (six) hours as needed for moderate pain or mild pain.     aspirin 81 MG chewable tablet Chew 1  tablet (81 mg total) by mouth daily. 90 tablet 2   carvedilol (COREG) 3.125 MG tablet Take 3.125 mg by mouth 2 (two) times daily.     furosemide (LASIX) 20 MG tablet Take 1 tablet (20 mg total) by mouth daily as needed. For weight gain 3lb overnight, 5lbs in a week, shortness of breath or leg edema 30 tablet 1   nitroGLYCERIN (NITROSTAT) 0.4 MG SL tablet Place 1 tablet (0.4 mg total) under the tongue every 5 (five) minutes x 3 doses as needed for chest pain. 25 tablet 2   prasugrel (EFFIENT) 10 MG TABS tablet Take 1 tablet (10 mg total) by mouth daily. 90 tablet 3   rosuvastatin (CRESTOR) 40 MG tablet Take 40 mg by mouth daily.     No current facility-administered medications for this visit.    Allergies  Allergen Reactions   Lactose Intolerance (Gi) Other (See Comments)    GI upset, SOB   Soy Allergy Other (See Comments)    GI upset   Sulfa Antibiotics Nausea And Vomiting   Sulfites     Specifically mentioned as an allergy to an  ingredient in inhalers.    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   Anxiety 1998   Spoke with ob/gyn. Prescription for Xanax for short period.   Arthritis 2005   Chiropractor mentioned inflammation throughout my spine  Depression 1987   Depressed in high school   High cholesterol    Myocardial infarction (HCC)    Systolic CHF, acute (HCC)     Blood pressure 108/74, pulse 63, resp. rate 16, height 5' 4.5" (1.638 m), weight 144 lb (65.3 kg), SpO2 99 %.   Hyperlipidemia Patient with familial hyperlipidemia (LDL 420 at time of MI), now at 249 on rosuvastatin 40 mg.  Had previously failed ezetimibe secondary to GI distress.  Reviewed options for lowering LDL cholesterol, including ezetimibe, PCSK-9 inhibitors, bempedoic acid and inclisiran.  Discussed mechanisms of action, dosing, side effects and potential decreases in LDL cholesterol.  Also reviewed cost information and potential options for  patient assistance.  Answered all patient questions.  Based on this information, patient would prefer to start PCSK-9i.  Will start process to get Repatha 140 mg Sureclick approved by insurance.  Once approved she will need to do one injection every 14 days and repeat labs    Phillips Hay PharmD CPP [facility] [address] [place], [place] [address] [phone]

## 2021-07-14 NOTE — Patient Instructions (Signed)
Your Results:             Your most recent labs Goal  Total Cholesterol 335 < 200  Triglycerides 97 < 150  HDL (happy/good cholesterol) 70 > 40  LDL (lousy/bad cholesterol 249 < 70   Medication changes:  We will start the process to get Repatha covered by your insurance.  Once the prior authorization is complete, Cinda Quest will call you to let you know and confirm pharmacy information.   You will take 1 injection every 14 days.    Lab orders:  We want to repeat labs after 2-3 months.  We will send you a lab order to remind you once we get closer to that time.      Thank you for choosing [facility]

## 2021-07-14 NOTE — Telephone Encounter (Signed)
Spoke to the patient, she was driving so I went to leave her the instructions but her mailbox is full. Will try again. S.Teresa Lemmerman EMTP

## 2021-07-15 ENCOUNTER — Telehealth (HOSPITAL_COMMUNITY): Payer: Self-pay

## 2021-07-15 ENCOUNTER — Ambulatory Visit (HOSPITAL_COMMUNITY): Payer: Commercial Managed Care - HMO

## 2021-07-15 NOTE — Telephone Encounter (Signed)
Attempted to contact the patient. Her mailbox is full and several attempts have been made to reach her. S.Amarii Amy EMTP

## 2021-07-15 NOTE — Telephone Encounter (Signed)
Patient returned call . Patient rescheduled stress test because she did not hold her beta blocker. Please call patient with other instructions. She states she has cleared out her mailbox

## 2021-07-16 ENCOUNTER — Encounter: Payer: Self-pay | Admitting: Family

## 2021-07-16 ENCOUNTER — Encounter: Payer: Self-pay | Admitting: Cardiovascular Disease

## 2021-07-16 ENCOUNTER — Ambulatory Visit (INDEPENDENT_AMBULATORY_CARE_PROVIDER_SITE_OTHER): Payer: Commercial Managed Care - HMO | Admitting: Family

## 2021-07-16 ENCOUNTER — Telehealth: Payer: Self-pay

## 2021-07-16 ENCOUNTER — Ambulatory Visit (INDEPENDENT_AMBULATORY_CARE_PROVIDER_SITE_OTHER)
Admission: RE | Admit: 2021-07-16 | Discharge: 2021-07-16 | Disposition: A | Discharge status: Home / Self Care | Payer: Commercial Managed Care - HMO | Source: Ambulatory Visit | Attending: Family | Admitting: Family

## 2021-07-16 VITALS — BP 108/62 | HR 68 | Temp 97.9°F | Resp 16 | Ht 64.5 in | Wt 145.1 lb

## 2021-07-16 DIAGNOSIS — S71151A Open bite, right thigh, initial encounter: Secondary | ICD-10-CM | POA: Diagnosis not present

## 2021-07-16 DIAGNOSIS — L0390 Cellulitis, unspecified: Secondary | ICD-10-CM

## 2021-07-16 DIAGNOSIS — M542 Cervicalgia: Secondary | ICD-10-CM

## 2021-07-16 DIAGNOSIS — W540XXA Bitten by dog, initial encounter: Secondary | ICD-10-CM

## 2021-07-16 DIAGNOSIS — S199XXA Unspecified injury of neck, initial encounter: Secondary | ICD-10-CM | POA: Diagnosis not present

## 2021-07-16 MED ORDER — DOXYCYCLINE HYCLATE 100 MG PO TABS: 100.0000 mg | ORAL_TABLET | Freq: Two times a day (BID) | ORAL | 0 refills | Status: AC

## 2021-07-16 NOTE — Assessment & Plan Note (Addendum)
Still with induration and significant hematoma.  Referring to general surgeon for eval for need to evacuation of hematoma Doxycycline for coverage of cellulitis, completed augmentin already  Monitor site for worsening s/s infection Had tdap in er

## 2021-07-16 NOTE — Patient Instructions (Addendum)
A referral was placed today for general surgery.  Please let us know if you have not heard back within 2 weeks about the referral.  Complete xray(s) prior to leaving today. I will notify you of your results once received.   Due to recent changes in healthcare laws, you may see results of your imaging and/or laboratory studies on MyChart before I have had a chance to review them.  I understand that in some cases there may be results that are confusing or concerning to you. Please understand that not all results are received at the same time and often I may need to interpret multiple results in order to provide you with the best plan of care or course of treatment. Therefore, I ask that you please give me 2 business days to thoroughly review all your results before contacting my office for clarification. Should we see a critical lab result, you will be contacted sooner.   It was a pleasure seeing you today! Please do not hesitate to reach out with any questions and or concerns.  Regards,   Mort Sawyers FNP-C

## 2021-07-16 NOTE — Assessment & Plan Note (Signed)
Xray today in office  Pending results

## 2021-07-16 NOTE — Assessment & Plan Note (Addendum)
Heat/ice to site.  Work on neck exercises Tylenol prn

## 2021-07-16 NOTE — Progress Notes (Signed)
Established Patient Office Visit  Subjective:  Patient ID: Wanda Wood, female    DOB: 1970/04/14  Age: 51 y.o. MRN: [id number]  CC:  Chief Complaint  Patient presents with  . Follow-up    Dog bite and dog bite    HPI Wanda Wood is here today with concerns.   May 25th was bit by a dog while riding her bike. Bit around right upper posterior thigh. She has pain in the area, has a large solid tender area that she also describes with some numbness. Unsure if dog was up to date on vaccinations. Was given augmentin 875/125 mg po bid x 10 days.   When she was bit by the dog she fell off the bike, was bit on her right posterior leg, when dog released from biting her she fell oto her left hip/thigh and has a healing hematoma as well. Her neck tweeked on the left side causing some pain, which has now increased. Today with limited range of motion due to the pain.   Was given tetanus TDAP vaccine while in the ER.   Site still a bit warm to the touch and painful. She will feel occasional twinges of pain as well.  No fever no chills.  On Effient and also baby asa as pt had a heart attack in December 2023.     Past Medical History:  Diagnosis Date  . 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  Marland Kitchen Anxiety 1998   Spoke with ob/gyn. Prescription for Xanax for short period.  . Arthritis 2005   Chiropractor mentioned inflammation throughout my spine  . Depression 1987   Depressed in high school  . High cholesterol   . Myocardial infarction ([place])   . Systolic CHF, acute [facility])     Past Surgical History:  Procedure Laterality Date  . CERVICAL SPINE SURGERY    . CORONARY STENT INTERVENTION N/A 01/20/2021   Procedure: CORONARY STENT INTERVENTION;  Surgeon: Lorretta Harp, MD;  Location: [place] [facility];  Service: Cardiovascular;  Laterality: N/A;  . LEFT HEART CATH AND CORONARY ANGIOGRAPHY N/A 01/20/2021   Procedure: LEFT HEART CATH AND CORONARY ANGIOGRAPHY;   Surgeon: Lorretta Harp, MD;  Location: [place] [facility];  Service: Cardiovascular;  Laterality: N/A;  . SPINE SURGERY  11/06/2010   Spinal fusion C5-C7    Family History  Problem Relation Age of Onset  . Thyroid disease Mother   . Heart disease Father   . Heart attack Father 16  . Cancer Maternal Grandmother 69       uterine cancer    Social History   Socioeconomic History  . Marital status: Single    Spouse name: Not on file  . Number of children: 0  . Years of education: Not on file  . Highest education level: Not on file  Occupational History  . Occupation: bartender/waitress    Comment: part time  Tobacco Use  . Smoking status: Former    Packs/day: 0.50    Years: 3.00    Pack years: 1.50    Types: Cigarettes  . Smokeless tobacco: Never  Vaping Use  . Vaping Use: Never used  Substance and Sexual Activity  . Alcohol use: Not Currently    Comment: no more than 4 glasses of red wine a week  . Drug use: Not Currently    Frequency: 4.0 times per week    Types: Marijuana  . Sexual activity: Not Currently  Birth control/protection: Post-menopausal  Other Topics Concern  . 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   Social Determinants of Health   Financial Resource Strain: High Risk  . Difficulty of Paying Living Expenses: Hard  Food Insecurity: No Food Insecurity  . Worried About Programme researcher, broadcasting/film/video in the Last Year: Never true  . Ran Out of Food in the Last Year: Never true  Transportation Needs: No Transportation Needs  . Lack of Transportation (Medical): No  . Lack of Transportation (Non-Medical): No  Physical Activity: Not on file  Stress: Not on file  Social Connections: Not on file  Intimate Partner Violence: Not on file    Outpatient Medications Prior to Visit  Medication Sig Dispense Refill  . acetaminophen (TYLENOL) 500 MG tablet Take 500 mg by mouth every 6 (six) hours  as needed for moderate pain or mild pain.    Marland Kitchen aspirin 81 MG chewable tablet Chew 1 tablet (81 mg total) by mouth daily. 90 tablet 2  . carvedilol (COREG) 3.125 MG tablet Take 3.125 mg by mouth 2 (two) times daily.    . furosemide (LASIX) 20 MG tablet Take 1 tablet (20 mg total) by mouth daily as needed. For weight gain 3lb overnight, 5lbs in a week, shortness of breath or leg edema 30 tablet 1  . nitroGLYCERIN (NITROSTAT) 0.4 MG SL tablet Place 1 tablet (0.4 mg total) under the tongue every 5 (five) minutes x 3 doses as needed for chest pain. 25 tablet 2  . prasugrel (EFFIENT) 10 MG TABS tablet Take 1 tablet (10 mg total) by mouth daily. 90 tablet 3  . rosuvastatin (CRESTOR) 40 MG tablet Take 40 mg by mouth daily.     No facility-administered medications prior to visit.    Allergies  Allergen Reactions  . Lactose Intolerance (Gi) Other (See Comments)    GI upset, SOB  . Soy Allergy Other (See Comments)    GI upset  . Sulfa Antibiotics Nausea And Vomiting  . Sulfites     Specifically mentioned as an allergy to an  ingredient in inhalers.        Objective:    Physical Exam Constitutional:      General: She is not in acute distress.    Appearance: Normal appearance. She is normal weight. She is not ill-appearing, toxic-appearing or diaphoretic.  Pulmonary:     Effort: Pulmonary effort is normal.  Musculoskeletal:     Cervical back: No edema or crepitus. Pain with movement and muscular tenderness (left lower posterior neck) present. Decreased range of motion (with flexion hyperextension and rotation to the left).  Skin:    Comments: Right posterior thigh induration with redness and warmth to site, induration 9 cm width, 5 cm height, tenderness to site as well with surrounding bruising  Neurological:     General: No focal deficit present.     Mental Status: She is alert and oriented to person, place, and time.  Psychiatric:        Mood and Affect: Mood normal.        Behavior:  Behavior normal.        Thought Content: Thought content normal.        Judgment: Judgment normal.       BP 108/62   Pulse 68   Temp 97.9 F (36.6 C)   Resp 16   Ht 5' 4.5" (1.638 m)  Wt 145 lb 2 oz (65.8 kg)   SpO2 97%   BMI 24.53 kg/m  Wt Readings from Last 3 Encounters:  07/16/21 145 lb 2 oz (65.8 kg)  07/14/21 144 lb (65.3 kg)  07/08/21 148 lb 3.2 oz (67.2 kg)     Health Maintenance Due  Topic Date Due  . HIV Screening  Never done  . Hepatitis C Screening  Never done  . COLONOSCOPY (Pts 45-94yrs Insurance coverage will need to be confirmed)  Never done  . MAMMOGRAM  Never done    There are no preventive care reminders to display for this patient.  Lab Results  Component Value Date   TSH 1.23 06/12/2021   Lab Results  Component Value Date   WBC 5.6 04/29/2021   HGB 12.5 04/29/2021   HCT 37.7 04/29/2021   MCV 90.5 04/29/2021   PLT 208.0 04/29/2021   Lab Results  Component Value Date   NA 141 04/29/2021   K 4.3 04/29/2021   CO2 30 04/29/2021   GLUCOSE 97 04/29/2021   BUN 9 04/29/2021   CREATININE 0.75 04/29/2021   BILITOT 0.3 04/29/2021   ALKPHOS 88 04/29/2021   AST 16 04/29/2021   ALT 13 04/29/2021   PROT 6.6 04/29/2021   ALBUMIN 4.3 04/29/2021   CALCIUM 9.1 04/29/2021   ANIONGAP 12 03/27/2021   GFR 91.09 04/29/2021   Lab Results  Component Value Date   HGBA1C 5.8 03/19/2021      Assessment & Plan:   Problem List Items Addressed This Visit       Other   Dog bite of right thigh    Still with induration and significant hematoma.  Referring to general surgeon for eval for need to evacuation of hematoma Doxycycline for coverage of cellulitis, completed augmentin already  Monitor site for worsening s/s infection Had tdap in er        Relevant Orders   Ambulatory referral to General Surgery   Cellulitis - Primary    RX doxycycline 100 mg po bid x 10 days        Relevant Medications   doxycycline (VIBRA-TABS) 100 MG tablet    Other Relevant Orders   Ambulatory referral to General Surgery   Neck injury, initial encounter    Xray today in office  Pending results       Relevant Orders   DG Cervical Spine Complete   Neck pain on left side    Heat/ice to site.  Work on neck exercises Tylenol prn        Relevant Orders   DG Cervical Spine Complete    Meds ordered this encounter  Medications  . doxycycline (VIBRA-TABS) 100 MG tablet    Sig: Take 1 tablet (100 mg total) by mouth 2 (two) times daily for 10 days.    Dispense:  20 tablet    Refill:  0    Order Specific Question:   Supervising Provider    Answer:   Diona Browner, AMY E [id number]    Follow-up: Return in about 2 weeks (around 07/30/2021) for follow up on dog bite with Catalina Antigua cable, FNP.    Eugenia Pancoast, FNP

## 2021-07-16 NOTE — Telephone Encounter (Signed)
Spoke with the patient, detailed instructions given. She stated that she would be here for her test. Asked to call back with any questions. S.Donnalynn Wheeless EMTP 

## 2021-07-16 NOTE — Assessment & Plan Note (Signed)
RX doxycycline 100 mg po bid x 10 days

## 2021-07-17 ENCOUNTER — Ambulatory Visit (HOSPITAL_COMMUNITY): Payer: Commercial Managed Care - HMO | Attending: Cardiology

## 2021-07-17 DIAGNOSIS — I255 Ischemic cardiomyopathy: Secondary | ICD-10-CM | POA: Insufficient documentation

## 2021-07-17 DIAGNOSIS — I5022 Chronic systolic (congestive) heart failure: Secondary | ICD-10-CM | POA: Insufficient documentation

## 2021-07-17 LAB — MYOCARDIAL PERFUSION IMAGING
LV dias vol: 67 mL (ref 46–106)
LV sys vol: 27 mL
Nuc Stress EF: 59 %
Peak HR: 112 {beats}/min
Rest HR: 60 {beats}/min
Rest Nuclear Isotope Dose: 10.6 mCi
SDS: 1
SRS: 0
SSS: 1
Stress Nuclear Isotope Dose: 30.7 mCi
TID: 1.01

## 2021-07-17 MED ORDER — TECHNETIUM TC 99M TETROFOSMIN IV KIT
30.7000 | PACK | Freq: Once | INTRAVENOUS | Status: AC | PRN
  Administered 2021-07-17: 30.7 via INTRAVENOUS

## 2021-07-17 MED ORDER — TECHNETIUM TC 99M TETROFOSMIN IV KIT
10.6000 | PACK | Freq: Once | INTRAVENOUS | Status: AC | PRN
  Administered 2021-07-17: 10.6 via INTRAVENOUS

## 2021-07-17 MED ORDER — REGADENOSON 0.4 MG/5ML IV SOLN
0.4000 mg | Freq: Once | INTRAVENOUS | Status: AC
  Administered 2021-07-17: 0.4 mg via INTRAVENOUS

## 2021-07-18 ENCOUNTER — Encounter: Payer: Self-pay | Admitting: Cardiovascular Disease

## 2021-07-18 ENCOUNTER — Encounter: Payer: Self-pay | Admitting: Pharmacist Clinician (PhC)/ Clinical Pharmacy Specialist

## 2021-07-22 ENCOUNTER — Ambulatory Visit: Payer: Commercial Managed Care - HMO | Admitting: Surgery

## 2021-07-22 ENCOUNTER — Encounter: Payer: Self-pay | Admitting: Surgery

## 2021-07-22 VITALS — BP 107/69 | HR 73 | Temp 97.8°F | Ht 64.5 in | Wt 142.8 lb

## 2021-07-22 DIAGNOSIS — S71151A Open bite, right thigh, initial encounter: Secondary | ICD-10-CM

## 2021-07-22 DIAGNOSIS — W540XXA Bitten by dog, initial encounter: Secondary | ICD-10-CM | POA: Diagnosis not present

## 2021-07-22 DIAGNOSIS — W540XXD Bitten by dog, subsequent encounter: Secondary | ICD-10-CM

## 2021-07-22 NOTE — Telephone Encounter (Signed)
I am ok with this.

## 2021-07-22 NOTE — Patient Instructions (Signed)
You may alternate heat and cold to the affected area as needed.    If you have any concerns or questions, please feel free to call our office.   Animal Bite, Adult Animal bites can be mild or serious. Small bites from house pets normally are mild. Bites from cats, strays, or wild animals can be serious. If a stray or wild animal bites you, you need to get medical help right away. You may also need a shot to prevent rabies infection. What increases the risk? Being near pets you do not know. Being near animals that are eating, sleeping, or caring for their babies. Being outside where small, wild animals move freely. What are the signs or symptoms? Pain. Bleeding. Swelling. Bruising. How is this treated? Treatment may include: Cleaning your wound. Rinsing out (flushing) your wound. This uses saline solution, which is made of salt and water. Putting a bandage on your wound. Closing your wound with stitches (sutures), staples, skin glue, or skin tape (adhesive strips). Antibiotic medicine. You may be given pills, cream, gel, or fluid through an IV. A tetanus shot. Rabies treatment, if the animal could have rabies. Surgery, if there is infection or damage that needs to be fixed. Follow these instructions at home: Medicines Take or apply over-the-counter and prescription medicines only as told by your doctor. If you were prescribed an antibiotic medicine, take or apply it as told by your doctor. Do not stop using it even if your wound gets better. Wound care  Follow instructions from your doctor about how to take care of your wound. Make sure you: Wash your hands with soap and water for at least 20 seconds before and after you change your bandage. If you cannot use soap and water, use hand sanitizer. Change your bandage. Leave stitches or skin glue in place for at least 2 weeks. Leave tape strips alone unless you are told to take them off. You may trim the edges of the tape strips if  they curl up. Check your wound every day for signs of infection. Check for: More redness, swelling, or pain. More fluid or blood. Warmth. Pus or a bad smell. General instructions  Raise (elevate) the injured area above the level of your heart while you are sitting or lying down. If told, put ice on the injured area. To do this: Put ice in a plastic bag. Place a towel between your skin and the bag. Leave the ice on for 20 minutes, 2-3 times per day. Take off the ice if your skin turns bright red. This is very important. If you cannot feel pain, heat, or cold, you have a greater risk of damage to the area. Keep all follow-up visits. Contact a doctor if: You have more redness, swelling, or pain around your wound. Your wound feels warm to the touch. You have a fever or chills. You have a general feeling of sickness (malaise). You feel like you may vomit. You vomit. You have pain that does not get better. Get help right away if: You have a red streak going away from your wound. You have any of these coming from your wound: Non-clear fluid. More blood. Pus or a bad smell. You have trouble moving your injured area. You lose feeling (have numbness) or feel tingling anywhere on your body. Summary Animal bites can be mild or serious. If a stray or wild animal bites you, you need to get medical help right away. Your doctor will look at the wound and may  ask about how the animal bite happened. Treatment may include wound care, antibiotic medicine, a tetanus shot, and rabies treatment. This information is not intended to replace advice given to you by your health care provider. Make sure you discuss any questions you have with your health care provider. Document Revised: 01/31/2021 Document Reviewed: 01/31/2021 Elsevier Patient Education  [employer].
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
## 2021-07-24 ENCOUNTER — Telehealth: Payer: Self-pay
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
## 2021-08-01 MED ORDER — REPATHA SURECLICK 140 MG/ML ~~LOC~~ SOAJ: 140.0000 mg | SUBCUTANEOUS | 12 refills | Status: DC

## 2021-08-06 ENCOUNTER — Ambulatory Visit: Payer: Self-pay | Admitting: Cardiovascular Disease

## 2021-08-28 ENCOUNTER — Telehealth: Payer: Self-pay

## 2021-08-28 NOTE — Telephone Encounter (Signed)
Faxed medical records to [facility] at [phone]. 

## 2021-09-04 ENCOUNTER — Encounter: Payer: Self-pay | Admitting: Internal Medicine

## 2021-09-04 ENCOUNTER — Telehealth (INDEPENDENT_AMBULATORY_CARE_PROVIDER_SITE_OTHER): Payer: Commercial Managed Care - HMO | Admitting: Internal Medicine

## 2021-09-04 VITALS — BP 108/68 | HR 72 | Wt 143.0 lb

## 2021-09-04 DIAGNOSIS — Z9861 Coronary angioplasty status: Secondary | ICD-10-CM

## 2021-09-04 DIAGNOSIS — I2510 Atherosclerotic heart disease of native coronary artery without angina pectoris: Secondary | ICD-10-CM

## 2021-09-04 DIAGNOSIS — I255 Ischemic cardiomyopathy: Secondary | ICD-10-CM | POA: Diagnosis not present

## 2021-09-04 DIAGNOSIS — E7801 Familial hypercholesterolemia: Secondary | ICD-10-CM

## 2021-09-04 NOTE — Progress Notes (Signed)
Virtual Visit via Video Note   Because of [name]'s co-morbid illnesses, she is at least at moderate risk for complications without adequate follow up.  This format is felt to be most appropriate for this patient at this time.  All issues noted in this document were discussed and addressed.  A limited physical exam was performed with this format.  Please refer to the patient's chart for her consent to telehealth for Wanda Wood.      Date:  09/04/2021   ID:  Wanda Wood, DOB [birth year], MRN [id number] The patient was identified using 2 identifiers.  Evaluation Performed:  New Patient Evaluation  Patient Location:  [address] [place] [address]  Provider location:   [address], [address] [place], [place] [address]  PCP:  Eden Emms, NP  Cardiologist:  Reatha Harps, MD Electrophysiologist:  None   Chief Complaint:  FH  History of Present Illness:    Wanda Wood is a 51 y.o. female who presents via audio/video conferencing for a telehealth visit today.  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  LVEF at the time was 25 to 35% however subsequently normalized.  She is noted to have significant dyslipidemia which has been lifelong.  Initial cholesterol testing was in her 83s which indicated a total cholesterol over 400.  She had seen cardiologist or lipid specialist in [place] at the time and was started on Lipitor.  She took that for several years and did have some improvement in her lipids with a total cholesterol less than 300.  She also has strong family history of high cholesterol and early heart disease including her father who had an MI in his 20s and her grandfather (father's father died of an MI).  Labs while hospitalized in December 2022 showed total cholesterol 511, triglycerides 110, HDL 69 and LDL of  420.  Subsequently she has been on rosuvastatin 40 mg daily which she seems to tolerate although may have some joint pains.  Repeat lipid NMR in February 2023, demonstrated LDL particle number of 1943 with LDL-C of 249, HDL-C of 70, triglycerides 97 and total cholesterol 335.  Small LDL particle numbers were low at 244.  Surprisingly, her LP(a) is not significantly elevated at 109, but is high.  Overall her lipid profile is suggestive highly of homozygous familial hyperlipidemia or possibly a compound heterozygous lipid disorder.  There had been some discussion about genetic testing however previously she had declined as she has no children and no plans to have children as well as no siblings, however this information could be helpful as it might potentially change therapeutic options for her.  Overall she says she feels much better.  She has been exercising regularly.  Her LVEF as mentioned has normalized and her lipids are much lower.  I think this is all playing a role in how she feels.  The patient does not have symptoms concerning for COVID-19 infection (fever, chills, cough, or new SHORTNESS OF BREATH).    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   Anxiety 1998   Spoke with ob/gyn. Prescription for Xanax for short period.   Arthritis 2005  Chiropractor mentioned inflammation throughout my spine   Depression 1987   Depressed in high school   High cholesterol    Myocardial infarction (HCC)    Systolic CHF, acute (HCC)     Past Surgical History:  Procedure Laterality Date   CERVICAL SPINE SURGERY     CORONARY STENT INTERVENTION N/A 01/20/2021   Procedure: CORONARY STENT INTERVENTION;  Surgeon: Runell Gess, MD;  [facility];  Service: Cardiovascular;  Laterality: N/A;   LEFT HEART CATH AND CORONARY ANGIOGRAPHY  N/A 01/20/2021   Procedure: LEFT HEART CATH AND CORONARY ANGIOGRAPHY;  Surgeon: Runell Gess, MD;  Location: [facility];  Service: Cardiovascular;  Laterality: N/A;   SPINE SURGERY  11/06/2010   Spinal fusion C5-C7    FAMHx:  Family History  Problem Relation Age of Onset   Thyroid disease Mother    Heart disease Father    Heart attack Father 77   Cancer Maternal Grandmother 1       uterine cancer    SOCHx:   reports that she has quit smoking. Her smoking use included cigarettes. She has a 1.50 pack-year smoking history. She has never used smokeless tobacco. She reports that she does not currently use alcohol. She reports that she does not currently use drugs after having used the following drugs: Marijuana. Frequency: 4.00 times per week.  ALLERGIES:  Allergies  Allergen Reactions   Lactose Intolerance (Gi) Other (See Comments)    GI upset, SOB   Soy Allergy Other (See Comments)    GI upset   Sulfa Antibiotics Nausea And Vomiting   Sulfites     Specifically mentioned as an allergy to an  ingredient in inhalers.    MEDS:  Current Meds  Medication Sig   acetaminophen (TYLENOL) 500 MG tablet Take 500 mg by mouth every 6 (six) hours as needed for moderate pain or mild pain.   aspirin 81 MG chewable tablet Chew 1 tablet (81 mg total) by mouth daily.   Evolocumab (REPATHA SURECLICK) 140 MG/ML SOAJ Inject 140 mg into the skin every 14 (fourteen) days.   furosemide (LASIX) 20 MG tablet Take 1 tablet (20 mg total) by mouth daily as needed. For weight gain 3lb overnight, 5lbs in a week, shortness of breath or leg edema   nitroGLYCERIN (NITROSTAT) 0.4 MG SL tablet Place 1 tablet (0.4 mg total) under the tongue every 5 (five) minutes x 3 doses as needed for chest pain.   prasugrel (EFFIENT) 10 MG TABS tablet Take 1 tablet (10 mg total) by mouth daily.   rosuvastatin (CRESTOR) 40 MG tablet Take 40 mg by mouth daily.     ROS: Pertinent items noted in HPI and remainder of  comprehensive ROS otherwise negative.  Labs/Other Tests and Data Reviewed:    Recent Labs: 01/18/2021: Magnesium 2.1 03/27/2021: B Natriuretic Peptide 33.7 04/29/2021: ALT 13; BUN 9; Creatinine, Ser 0.75; Hemoglobin 12.5; Platelets 208.0; Potassium 4.3; Sodium 141 06/12/2021: TSH 1.23   Recent Lipid Panel Lab Results  Component Value Date/Time   CHOL 292 (H) 04/01/2021 09:55 AM   TRIG 69 04/01/2021 09:55 AM   HDL 64 04/01/2021 09:55 AM   CHOLHDL 4.6 (H) 04/01/2021 09:55 AM   CHOLHDL 7.4 01/19/2021 12:20 AM   LDLCALC 218 (H) 04/01/2021 09:55 AM   LDLDIRECT 243 (H) 04/04/2021 03:42 PM    Wt Readings from Last 3 Encounters:  09/04/21 143 lb (64.9 kg)  07/22/21 142 lb 12.8 oz (64.8 kg)  07/17/21 148  lb (67.1 kg)     Exam:    Vital Signs:  BP 108/68   Pulse 72   Wt 143 lb (64.9 kg)   BMI 24.17 kg/m    General appearance: alert and no distress Lungs: No visual respiratory difficulty Abdomen: Normal weight Extremities: extremities normal, atraumatic, no cyanosis or edema Neurologic: Grossly normal  ASSESSMENT & PLAN:    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  I do think genetic testing would be potentially helpful as some lipid disorders can masquerade as HOFH, such as beta sitosterolemia or other rare disorders.  That being said, genetic testing is unlikely to impact offspring since she does not have them or her siblings.  She is considering it.  I agree with her current therapy of high intensity rosuvastatin and Repatha.  Will order repeat lipid NMR and LP(a) as I suspect will see some lowering in that.  She will likely need additional therapy to try to  target LDL less than 55 if possible for aggressive therapy.  This could potentially be accomplished with addition of other therapies such as ezetimibe or Nexletol.  She would also qualify for Evkeeza if necessary.  Thanks for this interesting consultation. Follow-up with me in 3-4 months.  COVID-19 Education: The signs and symptoms of COVID-19 were discussed with the patient and how to seek care for testing (follow up with PCP or arrange E-visit).  The importance of social distancing was discussed today.  Patient Risk:   After full review of this patients clinical status, I feel that they are at least moderate risk at this time.  Time:   Today, I have spent 25 minutes with the patient with telehealth technology discussing dyslipidemia.     Medication Adjustments/Labs and Tests Ordered: Current medicines are reviewed at length with the patient today.  Concerns regarding medicines are outlined above.   Tests Ordered: Orders Placed This Encounter  Procedures   NMR, lipoprofile   Lipoprotein A (LPA)    Medication Changes: No orders of the defined types were placed in this encounter.   Disposition:  in 4 month(s)  Chrystie Nose, MD, [facility], FACP  [facility]  [facility]  Medical Director of the Advanced Lipid Disorders &  Cardiovascular Risk Reduction Clinic Diplomate of the American Board of Clinical Lipidology Attending Cardiologist  Direct Dial: [phone]  Fax: [phone]  Website:  [email or web address]  Chrystie Nose, MD  09/04/2021 10:02 AM

## 2021-09-04 NOTE — Patient Instructions (Signed)
Medication Instructions:  CONTINUE current medications  *If you need a refill on your cardiac medications before your next appointment, please call your pharmacy*   Lab Work: FASTING lab work to check cholesterol in 3-4 months -- complete about a week before next visit   If you have labs (blood work) drawn today and your tests are completely normal, you will receive your results only by: MyChart Message (if you have MyChart) OR A paper copy in the mail If you have any lab test that is abnormal or we need to change your treatment, we will call you to review the results.  Follow-Up: At [facility], you and your health needs are our priority.  As part of our continuing mission to provide you with exceptional heart care, we have created designated Provider Care Teams.  These Care Teams include your primary Cardiologist (physician) and Advanced Practice Providers (APPs -  Physician Assistants and Nurse Practitioners) who all work together to provide you with the care you need, when you need it.  We recommend signing up for the patient portal called "MyChart".  Sign up information is provided on this After Visit Summary.  MyChart is used to connect with patients for Virtual Visits (Telemedicine).  Patients are able to view lab/test results, encounter notes, upcoming appointments, etc.  Non-urgent messages can be sent to your provider as well.   To learn more about what you can do with MyChart, go to [email or web address].    Your next appointment:   3-4 months with Dr. Rennis Golden - lipid clinic

## 2021-10-22 ENCOUNTER — Ambulatory Visit (INDEPENDENT_AMBULATORY_CARE_PROVIDER_SITE_OTHER): Payer: Commercial Managed Care - HMO | Admitting: Family

## 2021-10-22 ENCOUNTER — Encounter: Payer: Self-pay | Admitting: Family

## 2021-10-22 VITALS — BP 104/70 | HR 55 | Temp 98.7°F | Resp 16 | Ht 64.5 in | Wt 144.2 lb

## 2021-10-22 DIAGNOSIS — F411 Generalized anxiety disorder: Secondary | ICD-10-CM | POA: Diagnosis not present

## 2021-10-22 MED ORDER — BUSPIRONE HCL 5 MG PO TABS: 5.0000 mg | ORAL_TABLET | Freq: Two times a day (BID) | ORAL | 1 refills | Status: DC

## 2021-10-22 MED ORDER — DESVENLAFAXINE SUCCINATE ER 50 MG PO TB24: 50.0000 mg | ORAL_TABLET | Freq: Every day | ORAL | 1 refills | Status: DC

## 2021-10-22 MED ORDER — SERTRALINE HCL 50 MG PO TABS: 50.0000 mg | ORAL_TABLET | Freq: Every day | ORAL | 3 refills | Status: DC

## 2021-10-22 NOTE — Patient Instructions (Signed)
Start buspar 5 mg in am, and then take at night as well.  You will end up taking buspar twice daily.   A referral was placed today for psychology. Please let us know if you have not heard back within 2 weeks about the referral.   Regards,   Ayrabella Labombard FNP-C

## 2021-10-22 NOTE — Progress Notes (Signed)
Established Patient Office Visit  Subjective:  Patient ID: Wanda Wood, female    DOB: [birth year]  Age: 51 y.o. MRN: [id number]  CC:  Chief Complaint  Patient presents with   Anxiety    Started in June    HPI Wanda Wood is here today with concerns.   Noticing increased anxiety since around June. Since the dog bite back in June her stress has increased. She has been harassed by her neighbor as it was her neighbors dog that bit her, and this is making her more agitated and irritated. She has a lot of stressors, her 45 years old mom need knee replacement, and she is her caregiver at current to help her. She states she worries often because her mom will 'turn a grey color while sleeping' and it worries her to the point she is watching her breath to make sure she is ok. Recently also, her cat passed away, and this was the 'cherry on top'. She is also experiencing stress at work because there is one [employer] crew that has been talking about her.   Prays often to help her with anxiety, but still feels wound up.  She feels she might need medication at current. Back when she was in her 20's she was placed on xanax.   Has tried celexa and lexapro in the past but exacerbated her depression.   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   Anxiety 1998   Spoke with ob/gyn. Prescription for Xanax for short period.   Arthritis 2005   Chiropractor mentioned inflammation throughout my spine   Depression 1987   Depressed in high school   High cholesterol    Myocardial infarction (HCC)    Systolic CHF, acute (HCC)     Past Surgical History:  Procedure Laterality Date   CERVICAL SPINE SURGERY     CORONARY STENT INTERVENTION N/A 01/20/2021   Procedure: CORONARY STENT INTERVENTION;  Surgeon: Runell Gess, MD;  Location: [facility];  Service: Cardiovascular;  Laterality: N/A;   LEFT HEART CATH AND CORONARY ANGIOGRAPHY N/A  01/20/2021   Procedure: LEFT HEART CATH AND CORONARY ANGIOGRAPHY;  Surgeon: Runell Gess, MD;  Location: [facility];  Service: Cardiovascular;  Laterality: N/A;   SPINE SURGERY  11/06/2010   Spinal fusion C5-C7    Family History  Problem Relation Age of Onset   Thyroid disease Mother    Heart disease Father    Heart attack Father 67   Cancer Maternal Grandmother 109       uterine cancer    Social History   Socioeconomic History   Marital status: Single    Spouse name: Not on file   Number of children: 0   Years of education: Not on file   Highest education level: Not on file  Occupational History   Occupation: bartender/waitress    Comment: part time  Tobacco Use   Smoking status: Former    Packs/day: 0.50    Years: 3.00    Total pack years: 1.50    Types: Cigarettes   Smokeless tobacco: Never  Vaping Use   Vaping Use: Never used  Substance and Sexual Activity   Alcohol use: Not Currently    Comment: no more than 4 glasses of red wine a week   Drug use: Not Currently    Frequency: 4.0 times per week    Types: Marijuana   Sexual activity:  Not Currently    Birth control/protection: Post-menopausal  Other Topics Concern   Not on file  Social History Narrative   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   Social Determinants of Health   Financial Resource Strain: High Risk (01/22/2021)   Overall Financial Resource Strain (CARDIA)    Difficulty of Paying Living Expenses: Hard  Food Insecurity: No Food Insecurity (01/22/2021)   Hunger Vital Sign    Worried About Running Out of Food in the Last Year: Never true    Ran Out of Food in the Last Year: Never true  Transportation Needs: No Transportation Needs (01/22/2021)   PRAPARE - Administrator, Civil Service (Medical): No    Lack of Transportation (Non-Medical): No  Physical Activity: Not on file  Stress: Not on file  Social Connections: Not on file  Intimate  Partner Violence: Not on file    Outpatient Medications Prior to Visit  Medication Sig Dispense Refill   acetaminophen (TYLENOL) 500 MG tablet Take 500 mg by mouth every 6 (six) hours as needed for moderate pain or mild pain.     aspirin 81 MG chewable tablet Chew 1 tablet (81 mg total) by mouth daily. 90 tablet 2   Evolocumab (REPATHA SURECLICK) 140 MG/ML SOAJ Inject 140 mg into the skin every 14 (fourteen) days. 2 mL 12   furosemide (LASIX) 20 MG tablet Take 1 tablet (20 mg total) by mouth daily as needed. For weight gain 3lb overnight, 5lbs in a week, shortness of breath or leg edema 30 tablet 1   nitroGLYCERIN (NITROSTAT) 0.4 MG SL tablet Place 1 tablet (0.4 mg total) under the tongue every 5 (five) minutes x 3 doses as needed for chest pain. 25 tablet 2   prasugrel (EFFIENT) 10 MG TABS tablet Take 1 tablet (10 mg total) by mouth daily. 90 tablet 3   rosuvastatin (CRESTOR) 40 MG tablet Take 40 mg by mouth daily.     carvedilol (COREG) 3.125 MG tablet Take 3.125 mg by mouth 2 (two) times daily.     No facility-administered medications prior to visit.    Allergies  Allergen Reactions   Lactose Intolerance (Gi) Other (See Comments)    GI upset, SOB   Soy Allergy Other (See Comments)    GI upset   Sulfa Antibiotics Nausea And Vomiting   Sulfites     Specifically mentioned as an allergy to an  ingredient in inhalers.        Objective:    Physical Exam Constitutional:      General: She is not in acute distress.    Appearance: Normal appearance. She is normal weight. She is not ill-appearing, toxic-appearing or diaphoretic.  Cardiovascular:     Rate and Rhythm: Normal rate and regular rhythm.  Pulmonary:     Effort: Pulmonary effort is normal.  Neurological:     General: No focal deficit present.     Mental Status: She is alert.  Psychiatric:        Mood and Affect: Mood is anxious.        Behavior: Behavior normal.        Thought Content: Thought content normal.         Judgment: Judgment normal.     BP 104/70   Pulse (!) 55   Temp 98.7 F (37.1 C)   Resp 16   Ht 5' 4.5" (1.638 m)   Wt 144 lb 4 oz (65.4  kg)   SpO2 98%   BMI 24.38 kg/m  Wt Readings from Last 3 Encounters:  10/22/21 144 lb 4 oz (65.4 kg)  09/04/21 143 lb (64.9 kg)  07/22/21 142 lb 12.8 oz (64.8 kg)     Health Maintenance Due  Topic Date Due   HIV Screening  Never done   Hepatitis C Screening  Never done   COLONOSCOPY (Pts 45-70yrs Insurance coverage will need to be confirmed)  Never done   MAMMOGRAM  Never done   INFLUENZA VACCINE  Never done    There are no preventive care reminders to display for this patient.  Lab Results  Component Value Date   TSH 1.23 06/12/2021   Lab Results  Component Value Date   WBC 5.6 04/29/2021   HGB 12.5 04/29/2021   HCT 37.7 04/29/2021   MCV 90.5 04/29/2021   PLT 208.0 04/29/2021   Lab Results  Component Value Date   NA 141 04/29/2021   K 4.3 04/29/2021   CO2 30 04/29/2021   GLUCOSE 97 04/29/2021   BUN 9 04/29/2021   CREATININE 0.75 04/29/2021   BILITOT 0.3 04/29/2021   ALKPHOS 88 04/29/2021   AST 16 04/29/2021   ALT 13 04/29/2021   PROT 6.6 04/29/2021   ALBUMIN 4.3 04/29/2021   CALCIUM 9.1 04/29/2021   ANIONGAP 12 03/27/2021   GFR 91.09 04/29/2021   Lab Results  Component Value Date   HGBA1C 5.8 03/19/2021      Assessment & Plan:   Problem List Items Addressed This Visit       Other   GAD (generalized anxiety disorder) - Primary    After long d/w pt we decided on starting buspar 5 mg bid  Work on anxiety reducing techniques D/w pt possible side effects A referral was placed today for psychology for eval/treat Please let us know if you have not heard back within 2 weeks about the referral.       Relevant Orders   Ambulatory referral to Psychology    Meds ordered this encounter  Medications   DISCONTD: sertraline (ZOLOFT) 50 MG tablet    Sig: Take 1 tablet (50 mg total) by mouth daily.     Dispense:  30 tablet    Refill:  3    Order Specific Question:   Supervising Provider    Answer:   BEDSOLE, AMY E [[id number]]   DISCONTD: desvenlafaxine (PRISTIQ) 50 MG 24 hr tablet    Sig: Take 1 tablet (50 mg total) by mouth daily.    Dispense:  30 tablet    Refill:  1    Order Specific Question:   Supervising Provider    Answer:   Ermalene Searing, AMY E [[id number]]    Follow-up: Return in about 6 weeks (around 12/03/2021) for follow up on medication .    Mort Sawyers, FNP

## 2021-10-22 NOTE — Assessment & Plan Note (Signed)
After long d/w pt we decided on starting buspar 5 mg bid  Work on anxiety reducing techniques D/w pt possible side effects A referral was placed today for psychology for eval/treat Please let us know if you have not heard back within 2 weeks about the referral.

## 2021-10-23 ENCOUNTER — Encounter: Payer: Self-pay | Admitting: Family

## 2021-10-24 ENCOUNTER — Other Ambulatory Visit: Payer: Self-pay | Admitting: Family

## 2021-10-24 DIAGNOSIS — F411 Generalized anxiety disorder: Secondary | ICD-10-CM

## 2021-10-24 MED ORDER — ALPRAZOLAM 0.25 MG PO TABS: 0.2500 mg | ORAL_TABLET | Freq: Every day | ORAL | 0 refills | Status: AC | PRN

## 2021-10-26 NOTE — Progress Notes (Unsigned)
Cardiology Office Note:   Date:  10/28/2021  NAME:  Wanda Wood    MRN: [id number] DOB:  May 03, 1970   PCP:  Mort Sawyers, FNP  Cardiologist:  None  Electrophysiologist:  None   Referring MD: Eden Emms, NP   Chief Complaint  Patient presents with   Follow-up        History of Present Illness:   Wanda Wood is a 51 y.o. female with a hx of CAD, HLD, ICM who presents for follow-up.  She reports she is doing well.  Denies any chest pain or trouble breathing.  She is exercising on her elliptical several days per week.  Doing up to 15 minutes without any symptoms.  She is fasting.  Needs repeat lipids checked today.  She is on Repatha and Crestor.  Overall doing quite well.  No symptoms.  She is working on her anxiety.  She will see a therapist soon.  Problem List CAD/NSTEMI -01/20/2021 -PCI to pLAD 2. Ischemic CM with recovery of EF -EF 35-40% -EF 55-60% 04/09/2021 3. 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   Anxiety 1998   Spoke with ob/gyn. Prescription for Xanax for short period.   Arthritis 2005   Chiropractor mentioned inflammation throughout my spine   Depression 1987   Depressed in high school   High cholesterol    Myocardial infarction (HCC)    Systolic CHF, acute (HCC)     Past Surgical History: Past Surgical History:  Procedure Laterality Date   CERVICAL SPINE SURGERY     CORONARY STENT INTERVENTION N/A 01/20/2021   Procedure: CORONARY STENT INTERVENTION;  Surgeon: Runell Gess, MD;  Location: [facility];  Service: Cardiovascular;  Laterality: N/A;   LEFT HEART CATH AND CORONARY ANGIOGRAPHY N/A 01/20/2021   Procedure: LEFT HEART CATH AND CORONARY ANGIOGRAPHY;  Surgeon: Runell Gess, MD;  Location: [facility];  Service: Cardiovascular;  Laterality: N/A;   SPINE  SURGERY  11/06/2010   Spinal fusion C5-C7    Current Medications: Current Meds  Medication Sig   acetaminophen (TYLENOL) 500 MG tablet Take 500 mg by mouth every 6 (six) hours as needed for moderate pain or mild pain.   ALPRAZolam (XANAX) 0.25 MG tablet Take 1 tablet (0.25 mg total) by mouth daily as needed for anxiety.   aspirin 81 MG chewable tablet Chew 1 tablet (81 mg total) by mouth daily.   busPIRone (BUSPAR) 5 MG tablet Take 1 tablet (5 mg total) by mouth 2 (two) times daily.   Evolocumab (REPATHA SURECLICK) 140 MG/ML SOAJ Inject 140 mg into the skin every 14 (fourteen) days.   furosemide (LASIX) 20 MG tablet Take 1 tablet (20 mg total) by mouth daily as needed. For weight gain 3lb overnight, 5lbs in a week, shortness of breath or leg edema   nitroGLYCERIN (NITROSTAT) 0.4 MG SL tablet Place 1 tablet (0.4 mg total) under the tongue every 5 (five) minutes x 3 doses as needed for chest pain.   prasugrel (EFFIENT) 10 MG TABS tablet Take 1 tablet (10 mg total) by mouth daily.   rosuvastatin (CRESTOR) 40 MG tablet Take 40 mg by mouth daily.     Allergies:    Lactose intolerance (gi), Soy allergy, Sulfa antibiotics, and Sulfites   Social History: Social History   Socioeconomic History  Marital status: Single    Spouse name: Not on file   Number of children: 0   Years of education: Not on file   Highest education level: Not on file  Occupational History   Occupation: bartender/waitress    Comment: part time  Tobacco Use   Smoking status: Former    Packs/day: 0.50    Years: 3.00    Total pack years: 1.50    Types: Cigarettes   Smokeless tobacco: Never  Vaping Use   Vaping Use: Never used  Substance and Sexual Activity   Alcohol use: Not Currently    Comment: no more than 4 glasses of red wine a week   Drug use: Not Currently    Frequency: 4.0 times per week    Types: Marijuana   Sexual activity: Not Currently    Birth control/protection: Post-menopausal  Other Topics  Concern   Not on file  Social History Narrative   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   Social Determinants of Health   Financial Resource Strain: High Risk (01/22/2021)   Overall Financial Resource Strain (CARDIA)    Difficulty of Paying Living Expenses: Hard  Food Insecurity: No Food Insecurity (01/22/2021)   Hunger Vital Sign    Worried About Running Out of Food in the Last Year: Never true    Ran Out of Food in the Last Year: Never true  Transportation Needs: No Transportation Needs (01/22/2021)   PRAPARE - Administrator, Civil ServiceTransportation    Lack of Transportation (Medical): No    Lack of Transportation (Non-Medical): No  Physical Activity: Not on file  Stress: Not on file  Social Connections: Not on file     Family History: The patient's family history includes Cancer (age of onset: 1050) in her maternal grandmother; Heart attack (age of onset: 6852) in her father; Heart disease in her father; Thyroid disease in her mother.  ROS:   All other ROS reviewed and negative. Pertinent positives noted in the HPI.     EKGs/Labs/Other Studies Reviewed:   The following studies were personally reviewed by me today:  TTE 04/09/2021 1. Left ventricular ejection fraction, by estimation, is 55 to 60%. Left  ventricular ejection fraction by 3D volume is 59 %. The left ventricle has  normal function. The left ventricle has no regional wall motion  abnormalities. Left ventricular diastolic   parameters were normal.   2. Right ventricular systolic function is normal. The right ventricular  size is normal. There is normal pulmonary artery systolic pressure. The  estimated right ventricular systolic pressure is 17.7 mmHg.   3. The mitral valve is normal in structure. Trivial mitral valve  regurgitation. No evidence of mitral stenosis.   4. The aortic valve is tricuspid. Aortic valve regurgitation is mild.  Aortic valve sclerosis is present, with no evidence of aortic valve   stenosis. Aortic regurgitation PHT measures 460 msec.   5. The inferior vena cava is normal in size with greater than 50%  respiratory variability, suggesting right atrial pressure of 3 mmHg.   6. Compared to study dated 01/21/2021, LVF has normalized.   Recent Labs: 01/18/2021: Magnesium 2.1 03/27/2021: B Natriuretic Peptide 33.7 04/29/2021: ALT 13; BUN 9; Creatinine, Ser 0.75; Hemoglobin 12.5; Platelets 208.0; Potassium 4.3; Sodium 141 06/12/2021: TSH 1.23   Recent Lipid Panel    Component Value Date/Time   CHOL 292 (H) 04/01/2021 0955   TRIG 69 04/01/2021 0955   HDL 64 04/01/2021 0955  CHOLHDL 4.6 (H) 04/01/2021 0955   CHOLHDL 7.4 01/19/2021 0020   VLDL 22 01/19/2021 0020   LDLCALC 218 (H) 04/01/2021 0955   LDLDIRECT 243 (H) 04/04/2021 1542    Physical Exam:   VS:  BP 100/70   Pulse (!) 58   Ht 5\' 4"  (1.626 m)   Wt 147 lb 9.6 oz (67 kg)   SpO2 99%   BMI 25.34 kg/m    Wt Readings from Last 3 Encounters:  10/28/21 147 lb 9.6 oz (67 kg)  10/22/21 144 lb 4 oz (65.4 kg)  09/04/21 143 lb (64.9 kg)    General: Well nourished, well developed, in no acute distress Head: Atraumatic, normal size  Eyes: PEERLA, EOMI  Neck: Supple, no JVD Endocrine: No thryomegaly Cardiac: Normal S1, S2; RRR; no murmurs, rubs, or gallops Lungs: Clear to auscultation bilaterally, no wheezing, rhonchi or rales  Abd: Soft, nontender, no hepatomegaly  Ext: No edema, pulses 2+ Musculoskeletal: No deformities, BUE and BLE strength normal and equal Skin: Warm and dry, no rashes   Neuro: Alert and oriented to person, place, time, and situation, CNII-XII grossly intact, no focal deficits  Psych: Normal mood and affect   ASSESSMENT:   Lauralyn Shadowens is a 51 y.o. female who presents for the following: 1. Coronary artery disease involving native coronary artery of native heart without angina pectoris   2. Hyperlipidemia, unspecified hyperlipidemia type   3. Homozygous familial hypercholesterolemia    4. Ischemic cardiomyopathy     PLAN:   1. Coronary artery disease involving native coronary artery of native heart without angina pectoris 2. Hyperlipidemia, unspecified hyperlipidemia type 3. Homozygous familial hypercholesterolemia 4. Ischemic cardiomyopathy -History of non-STEMI with critical stenosis in the LAD in December 2022.  Status post PCI.  She is likely homozygous familial hyperlipidemia.  She is now on Crestor and Repatha.  She will continue aspirin and Effient for 1 year.  After 1 year she will continue aspirin indefinitely.  Her ejection fraction has recovered.  She is doing well.  She has no symptoms of heart failure.  We will continue with current medications for now.  She has low blood pressure at baseline did not tolerate many of her heart failure medications.  It is good that her heart recovered with revascularization.  Disposition: Return in about 3 months (around 01/27/2022).  Medication Adjustments/Labs and Tests Ordered: Current medicines are reviewed at length with the patient today.  Concerns regarding medicines are outlined above.  Orders Placed This Encounter  Procedures   Lipid panel   No orders of the defined types were placed in this encounter.   Patient Instructions  Medication Instructions:  The current medical regimen is effective;  continue present plan and medications.  *If you need a refill on your cardiac medications before your next appointment, please call your pharmacy*   Lab Work: LIPID today  If you have labs (blood work) drawn today and your tests are completely normal, you will receive your results only by: [place] (if you have MyChart) OR A paper copy in the mail If you have any lab test that is abnormal or we need to change your treatment, we will call you to review the results.   Follow-Up: At [facility], you and your health needs are our priority.  As part of our continuing mission to provide you with  exceptional heart care, we have created designated Provider Care Teams.  These Care Teams include your primary Cardiologist (physician) and Advanced Practice Providers (  APPs -  Physician Assistants and Nurse Practitioners) who all work together to provide you with the care you need, when you need it.  We recommend signing up for the patient portal called "MyChart".  Sign up information is provided on this After Visit Summary.  MyChart is used to connect with patients for Virtual Visits (Telemedicine).  Patients are able to view lab/test results, encounter notes, upcoming appointments, etc.  Non-urgent messages can be sent to your provider as well.   To learn more about what you can do with MyChart, go to [email or web address].    Your next appointment:   3 month(s)  The format for your next appointment:   In Person  Provider:   Lennie Odor, MD         Time Spent with Patient: I have spent a total of 35 minutes with patient reviewing hospital notes, telemetry, EKGs, labs and examining the patient as well as establishing an assessment and plan that was discussed with the patient.  > 50% of time was spent in direct patient care.  Signed, Lenna Gilford. Flora Lipps, MD, [facility]  [facility]  [address], [address] [place], [place] [address] [phone]  10/28/2021 9:01 AM

## 2021-10-28 ENCOUNTER — Ambulatory Visit: Payer: Commercial Managed Care - HMO | Attending: Cardiovascular Disease | Admitting: Cardiovascular Disease

## 2021-10-28 ENCOUNTER — Encounter: Payer: Self-pay | Admitting: Cardiovascular Disease

## 2021-10-28 ENCOUNTER — Encounter: Payer: Self-pay | Admitting: Family

## 2021-10-28 VITALS — BP 100/70 | HR 58 | Ht 64.0 in | Wt 147.6 lb

## 2021-10-28 DIAGNOSIS — I255 Ischemic cardiomyopathy: Secondary | ICD-10-CM | POA: Diagnosis not present

## 2021-10-28 DIAGNOSIS — E7801 Familial hypercholesterolemia: Secondary | ICD-10-CM

## 2021-10-28 DIAGNOSIS — E785 Hyperlipidemia, unspecified: Secondary | ICD-10-CM

## 2021-10-28 DIAGNOSIS — I2510 Atherosclerotic heart disease of native coronary artery without angina pectoris: Secondary | ICD-10-CM

## 2021-10-28 LAB — LIPID PANEL
Chol/HDL Ratio: 1.9 ratio (ref 0.0–4.4)
Cholesterol, Total: 176 mg/dL (ref 100–199)
HDL: 92 mg/dL (ref >39)
LDL Chol Calc (NIH): 75 mg/dL (ref 0–99)
Triglycerides: 41 mg/dL (ref 0–149)
VLDL Cholesterol Cal: 9 mg/dL (ref 5–40)

## 2021-10-28 NOTE — Patient Instructions (Signed)
Medication Instructions:  The current medical regimen is effective;  continue present plan and medications.  *If you need a refill on your cardiac medications before your next appointment, please call your pharmacy*   Lab Work: LIPID today  If you have labs (blood work) drawn today and your tests are completely normal, you will receive your results only by: [place] (if you have MyChart) OR A paper copy in the mail If you have any lab test that is abnormal or we need to change your treatment, we will call you to review the results.   Follow-Up: At [facility], you and your health needs are our priority.  As part of our continuing mission to provide you with exceptional heart care, we have created designated Provider Care Teams.  These Care Teams include your primary Cardiologist (physician) and Advanced Practice Providers (APPs -  Physician Assistants and Nurse Practitioners) who all work together to provide you with the care you need, when you need it.  We recommend signing up for the patient portal called "MyChart".  Sign up information is provided on this After Visit Summary.  MyChart is used to connect with patients for Virtual Visits (Telemedicine).  Patients are able to view lab/test results, encounter notes, upcoming appointments, etc.  Non-urgent messages can be sent to your provider as well.   To learn more about what you can do with MyChart, go to [email or web address].    Your next appointment:   3 month(s)  The format for your next appointment:   In Person  Provider:   Eleonore Chiquito, MD

## 2021-10-29 NOTE — Telephone Encounter (Signed)
Do you know who I would route this too?  I did refer her to our therapist, Santiago Glad so thinking maybe her? But I'm not sure who sends out these emails to set up with her.

## 2021-11-13 ENCOUNTER — Ambulatory Visit (INDEPENDENT_AMBULATORY_CARE_PROVIDER_SITE_OTHER): Payer: Commercial Managed Care - HMO | Admitting: Surgery

## 2021-11-13 ENCOUNTER — Encounter: Payer: Self-pay | Admitting: Surgery

## 2021-11-13 VITALS — BP 124/77 | HR 65 | Temp 98.0°F | Wt 146.2 lb

## 2021-11-13 DIAGNOSIS — S71151S Open bite, right thigh, sequela: Secondary | ICD-10-CM

## 2021-11-13 DIAGNOSIS — W540XXS Bitten by dog, sequela: Secondary | ICD-10-CM | POA: Diagnosis not present

## 2021-11-13 DIAGNOSIS — M792 Neuralgia and neuritis, unspecified: Secondary | ICD-10-CM | POA: Insufficient documentation

## 2021-11-13 NOTE — Progress Notes (Signed)
Patient ID: Wanda Wood, female   DOB: [birth year], 51 y.o.   MRN: [id number]  Chief Complaint: Persisting/lingering issues following right thigh dog bite injury.  History of Present Illness Wanda Wood is a 51 y.o. female with a follow-up visit 4 months since her last.  She reports that she has lingering and persistent pain in the surrounding area of the dog bite of her right lateral posterior thigh.  There is an area of centralized numbness where there is also some discoloration that persists.  This numb area seems to correspond to the degree of dimpling in the area.  See photos.  There is some associated surrounding pain, but there is also a pain that radiates from the buttock down the back of her thigh and into her distal leg which reminds me of sciatica.  However we did not elaborate on what seems to exacerbate this aside from prolonged standing at work seems to increase it.  She indicates the pain is a 6 on a scale of 10. She presents today to pursue further revelation of the causative traumatic injuries, potential for correction, and prognosis for improvement.  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   Anxiety 1998   Spoke with ob/gyn. Prescription for Xanax for short period.   Arthritis 2005   Chiropractor mentioned inflammation throughout my spine   Depression 1987   Depressed in high school   High cholesterol    Myocardial infarction ([facility])    Systolic CHF, acute ([facility])       Past Surgical History:  Procedure Laterality Date   CERVICAL SPINE SURGERY     CORONARY STENT INTERVENTION N/A 01/20/2021   Procedure: CORONARY STENT INTERVENTION;  Surgeon: Lorretta Harp, MD;  Location: [facility];  Service: Cardiovascular;  Laterality: N/A;   LEFT HEART CATH AND CORONARY ANGIOGRAPHY N/A 01/20/2021   Procedure: LEFT HEART CATH AND CORONARY ANGIOGRAPHY;  Surgeon: Lorretta Harp, MD;  Location: [facility];  Service: Cardiovascular;  Laterality: N/A;   SPINE SURGERY  11/06/2010   Spinal fusion C5-C7    Allergies  Allergen Reactions   Lactose Intolerance (Gi) Other (See Comments)    GI upset, SOB   Soy Allergy Other (See Comments)    GI upset   Sulfa Antibiotics Nausea And Vomiting   Sulfites     Specifically mentioned as an allergy to an  ingredient in inhalers.    Current Outpatient Medications  Medication Sig Dispense Refill   acetaminophen (TYLENOL) 500 MG tablet Take 500 mg by mouth every 6 (six) hours as needed for moderate pain or mild pain.     ALPRAZolam (XANAX) 0.25 MG tablet Take 1 tablet (0.25 mg total) by mouth daily as needed for anxiety. 20 tablet 0   aspirin 81 MG chewable tablet Chew 1 tablet (81 mg total) by mouth daily. 90 tablet 2   busPIRone (BUSPAR) 5 MG tablet Take 1 tablet (5 mg total) by mouth 2 (two) times daily. 60 tablet 1   Evolocumab (REPATHA SURECLICK) 270 MG/ML SOAJ Inject 140 mg into the skin every 14 (fourteen) days. 2 mL 12   furosemide (LASIX) 20 MG tablet Take 1 tablet (20 mg total) by mouth daily as needed. For weight gain 3lb overnight, 5lbs in a week, shortness of breath or leg edema 30 tablet 1   nitroGLYCERIN (NITROSTAT) 0.4 MG SL tablet Place 1 tablet (0.4 mg total)  under the tongue every 5 (five) minutes x 3 doses as needed for chest pain. 25 tablet 2   prasugrel (EFFIENT) 10 MG TABS tablet Take 1 tablet (10 mg total) by mouth daily. 90 tablet 3   rosuvastatin (CRESTOR) 40 MG tablet Take 40 mg by mouth daily.     No current facility-administered medications for this visit.    Family History Family History  Problem Relation Age of Onset   Thyroid disease Mother    Heart disease Father    Heart attack Father 32   Cancer Maternal Grandmother 34       uterine cancer      Social History Social History   Tobacco Use   Smoking status: Former    Packs/day: 0.50    Years: 3.00    Total pack years: 1.50    Types:  Cigarettes   Smokeless tobacco: Never  Vaping Use   Vaping Use: Never used  Substance Use Topics   Alcohol use: Not Currently    Comment: no more than 4 glasses of red wine a week   Drug use: Not Currently    Frequency: 4.0 times per week    Types: Marijuana          Physical Exam Blood pressure 124/77, pulse 65, temperature 98 F (36.7 C), temperature source Oral, weight 146 lb 3.2 oz (66.3 kg), SpO2 97 %. Last Weight  Most recent update: 11/13/2021  3:00 PM    Weight  66.3 kg (146 lb 3.2 oz)             CONSTITUTIONAL: Well developed, and nourished, appropriately responsive and aware without distress.   EYES: Sclera non-icteric.   EARS, NOSE, MOUTH AND THROAT:  Oral mucosa is pink and moist. Hearing is intact to voice.  RESPIRATORY:  Normal respiratory effort without pathologic use of accessory muscles. MUSCULOSKELETAL:  Symmetrical muscle tone appreciated in all four extremities.    SKIN: Skin turgor is normal. No pathologic skin lesions appreciated.         NEUROLOGIC:  Motor function appear grossly normal.  Cranial nerves are grossly without defect. PSYCH:  Alert and oriented to person, place and time. Affect is appropriate for situation.  Data Reviewed I have personally reviewed what is currently available of the patient's imaging, recent labs and medical records.   Labs:     Latest Ref Rng & Units 04/29/2021    9:17 AM 03/27/2021   12:05 PM 03/19/2021   12:00 AM  CBC  WBC 4.0 - 10.5 K/uL 5.6  10.8  6.0      Hemoglobin 12.0 - 15.0 g/dL 03.5  46.5  68.1      Hematocrit 36.0 - 46.0 % 37.7  42.8  38      Platelets 150.0 - 400.0 K/uL 208.0  235  244         This result is from an external source.      Latest Ref Rng & Units 04/29/2021    9:17 AM 03/27/2021   12:05 PM 03/19/2021   12:00 AM  CMP  Glucose 70 - 99 mg/dL 97  275    BUN 6 - 23 mg/dL 9  <5  8      Creatinine 0.40 - 1.20 mg/dL 1.70  0.17  0.8      Sodium 135 - 145 mEq/L 141  139  146       Potassium 3.5 - 5.1 mEq/L 4.3  4.1  4.6  Chloride 96 - 112 mEq/L 103  103  164      CO2 19 - 32 mEq/L 30  24  25       Calcium 8.4 - 10.5 mg/dL 9.1  9.7  9.5      Total Protein 6.0 - 8.3 g/dL 6.6  7.5    Total Bilirubin 0.2 - 1.2 mg/dL 0.3  0.5    Alkaline Phos 39 - 117 U/L 88  84  91      AST 0 - 37 U/L 16  17  14       ALT 0 - 35 U/L 13  12  12          This result is from an external source.      Imaging: Within last 24 hrs: No results found.  Assessment    Residual sequelae following dog bite injury to right thigh.  Involving chronic pain, and numbness. Question potential for addition of sciatica. Patient Active Problem List   Diagnosis Date Noted   GAD (generalized anxiety disorder) 10/22/2021   Neck injury, initial encounter 07/16/2021   Neck pain on left side 07/16/2021   Preventative health care 04/29/2021   Ischemic cardiomyopathy 01/22/2021   CAD (coronary artery disease) 01/22/2021   Hypotension 01/21/2021   Hyperlipidemia 01/19/2021   NSTEMI (non-ST elevated myocardial infarction) (HCC) 01/18/2021    Plan    We will obtain MRI of the right thigh, to evaluate for any anatomic potential for improvement/prognostication of resolution of current neurological syndrome.   Referral to neurologist who may assist with nerve mapping of the area and identify any specific nerve injury to address and help alleviate with loss of sensation versus aiding in relief of chronic pain.    Face-to-face time spent with the patient and accompanying care providers(if present) was 20 minutes, with more than 50% of the time spent coordinating care of the patient.    These notes generated with voice recognition software. I apologize for typographical errors.  01/23/2021 M.D., FACS 11/13/2021, 3:46 PM

## 2021-11-13 NOTE — Patient Instructions (Addendum)
Your MRI is scheduled for 11/20/2021 at 5 pm (arrive by 4:30 pm) @ [facility].    If you have any concerns or questions, please feel free to call our office.   Animal Bite, Adult Animal bites can be mild or serious. Small bites from house pets normally are mild. Bites from cats, strays, or wild animals can be serious. If a stray or wild animal bites you, you need to get medical help right away. You may also need a shot to prevent rabies infection. What increases the risk? Being near pets you do not know. Being near animals that are eating, sleeping, or caring for their babies. Being outside where small, wild animals move freely. What are the signs or symptoms? Pain. Bleeding. Swelling. Bruising. How is this treated? Treatment may include: Cleaning your wound. Rinsing out (flushing) your wound. This uses saline solution, which is made of salt and water. Putting a bandage on your wound. Closing your wound with stitches (sutures), staples, skin glue, or skin tape (adhesive strips). Antibiotic medicine. You may be given pills, cream, gel, or fluid through an IV. A tetanus shot. Rabies treatment, if the animal could have rabies. Surgery, if there is infection or damage that needs to be fixed. Follow these instructions at home: Medicines Take or apply over-the-counter and prescription medicines only as told by your doctor. If you were prescribed an antibiotic medicine, take or apply it as told by your doctor. Do not stop using it even if your wound gets better. Wound care  Follow instructions from your doctor about how to take care of your wound. Make sure you: Wash your hands with soap and water for at least 20 seconds before and after you change your bandage. If you cannot use soap and water, use hand sanitizer. Change your bandage. Leave stitches or skin glue in place for at least 2 weeks. Leave tape strips alone unless you are told to take them off. You may trim the edges  of the tape strips if they curl up. Check your wound every day for signs of infection. Check for: More redness, swelling, or pain. More fluid or blood. Warmth. Pus or a bad smell. General instructions  Raise (elevate) the injured area above the level of your heart while you are sitting or lying down. If told, put ice on the injured area. To do this: Put ice in a plastic bag. Place a towel between your skin and the bag. Leave the ice on for 20 minutes, 2-3 times per day. Take off the ice if your skin turns bright red. This is very important. If you cannot feel pain, heat, or cold, you have a greater risk of damage to the area. Keep all follow-up visits. Contact a doctor if: You have more redness, swelling, or pain around your wound. Your wound feels warm to the touch. You have a fever or chills. You have a general feeling of sickness (malaise). You feel like you may vomit. You vomit. You have pain that does not get better. Get help right away if: You have a red streak going away from your wound. You have any of these coming from your wound: Non-clear fluid. More blood. Pus or a bad smell. You have trouble moving your injured area. You lose feeling (have numbness) or feel tingling anywhere on your body. Summary Animal bites can be mild or serious. If a stray or wild animal bites you, you need to get medical help right away. Your doctor will look  at the wound and may ask about how the animal bite happened. Treatment may include wound care, antibiotic medicine, a tetanus shot, and rabies treatment. This information is not intended to replace advice given to you by your health care provider. Make sure you discuss any questions you have with your health care provider. Document Revised: 01/31/2021 Document Reviewed: 01/31/2021 Elsevier Patient Education  2023 [employer].

## 2021-11-20 ENCOUNTER — Ambulatory Visit: Payer: Commercial Managed Care - HMO

## 2021-11-24 ENCOUNTER — Telehealth: Payer: Self-pay

## 2021-11-25 ENCOUNTER — Ambulatory Visit: Payer: Commercial Managed Care - HMO

## 2021-11-26 NOTE — Telephone Encounter (Signed)
Error

## 2021-12-01 ENCOUNTER — Ambulatory Visit: Payer: Commercial Managed Care - HMO

## 2021-12-01 ENCOUNTER — Telehealth: Payer: Self-pay | Admitting: *Deleted

## 2021-12-09 ENCOUNTER — Ambulatory Visit: Payer: Commercial Managed Care - HMO

## 2021-12-15 ENCOUNTER — Telehealth: Payer: Self-pay

## 2021-12-15 NOTE — Telephone Encounter (Signed)
Faxed medical records to [facility] at [phone].

## 2021-12-23 ENCOUNTER — Ambulatory Visit: Payer: Commercial Managed Care - HMO | Admitting: Diagnostic Neuroimaging

## 2021-12-23 ENCOUNTER — Encounter: Payer: Self-pay | Admitting: Diagnostic Neuroimaging

## 2021-12-23 VITALS — BP 118/79 | HR 75 | Ht 64.0 in | Wt 150.0 lb

## 2021-12-23 DIAGNOSIS — S71151S Open bite, right thigh, sequela: Secondary | ICD-10-CM

## 2021-12-23 DIAGNOSIS — W540XXS Bitten by dog, sequela: Secondary | ICD-10-CM

## 2021-12-23 NOTE — Progress Notes (Signed)
[facility]  PATIENT: Wanda Wood DOB: 1970-02-16  REFERRING CLINICIAN: Campbell Lerner, MD HISTORY FROM: patient  REASON FOR VISIT: new consult    HISTORICAL  CHIEF COMPLAINT:  Chief Complaint  Patient presents with   Extremity Weakness    RM 7 alone Pt is well. Having R leg pain/tingling from dog bite back in May in upper thigh.     HISTORY OF PRESENT ILLNESS:   51 year old female here for evaluation of posttraumatic numbness and pain.  07/03/2021 patient was riding her bike in her neighborhood when a dog attacked her and bit her on the right lateral thigh.  Dog bit on hard onto her thigh and did not let go.  When the dog let go she fell towards her left side onto her left hip.  She was able to stand up and take her self to the emergency room.    REVIEW OF SYSTEMS: Full 14 system review of systems performed and negative with exception of: as per HPI.  ALLERGIES: Allergies  Allergen Reactions   Lactose Intolerance (Gi) Other (See Comments)    GI upset, SOB   Soy Allergy Other (See Comments)    GI upset   Sulfa Antibiotics Nausea And Vomiting   Sulfites     Specifically mentioned as an allergy to an  ingredient in inhalers.    HOME MEDICATIONS: Outpatient Medications Prior to Visit  Medication Sig Dispense Refill   acetaminophen (TYLENOL) 500 MG tablet Take 500 mg by mouth every 6 (six) hours as needed for moderate pain or mild pain.     aspirin 81 MG chewable tablet Chew 1 tablet (81 mg total) by mouth daily. 90 tablet 2   busPIRone (BUSPAR) 5 MG tablet Take 1 tablet (5 mg total) by mouth 2 (two) times daily. 60 tablet 1   Evolocumab (REPATHA SURECLICK) 140 MG/ML SOAJ Inject 140 mg into the skin every 14 (fourteen) days. 2 mL 12   furosemide (LASIX) 20 MG tablet Take 1 tablet (20 mg total) by mouth daily as needed. For weight gain 3lb overnight, 5lbs in a week, shortness of breath or leg edema 30 tablet 1   nitroGLYCERIN (NITROSTAT) 0.4 MG SL  tablet Place 1 tablet (0.4 mg total) under the tongue every 5 (five) minutes x 3 doses as needed for chest pain. 25 tablet 2   prasugrel (EFFIENT) 10 MG TABS tablet Take 1 tablet (10 mg total) by mouth daily. 90 tablet 3   rosuvastatin (CRESTOR) 40 MG tablet Take 40 mg by mouth daily.     No facility-administered medications prior to visit.    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   Anxiety 1998   Spoke with ob/gyn. Prescription for Xanax for short period.   Arthritis 2005   Chiropractor mentioned inflammation throughout my spine   Depression 1987   Depressed in high school   High cholesterol    Myocardial infarction ([facility])    Systolic CHF, acute ([facility])     PAST SURGICAL HISTORY: Past Surgical History:  Procedure Laterality Date   CERVICAL SPINE SURGERY     CORONARY STENT INTERVENTION N/A 01/20/2021   Procedure: CORONARY STENT INTERVENTION;  Surgeon: Runell Gess, MD;  Location: [facility];  Service: Cardiovascular;  Laterality: N/A;   LEFT HEART CATH AND CORONARY ANGIOGRAPHY N/A 01/20/2021   Procedure: LEFT HEART CATH AND CORONARY ANGIOGRAPHY;  Surgeon: Runell Gess,  MD;  Location: [facility];  Service: Cardiovascular;  Laterality: N/A;   SPINE SURGERY  11/06/2010   Spinal fusion C5-C7    FAMILY HISTORY: Family History  Problem Relation Age of Onset   Thyroid disease Mother    Heart disease Father    Heart attack Father 45   Cancer Maternal Grandmother 16       uterine cancer    SOCIAL HISTORY: Social History   Socioeconomic History   Marital status: Single    Spouse name: Not on file   Number of children: 0   Years of education: Not on file   Highest education level: Not on file  Occupational History   Occupation: bartender/waitress    Comment: part time  Tobacco Use   Smoking status: Former    Packs/day: 0.50    Years: 3.00    Total pack years: 1.50     Types: Cigarettes   Smokeless tobacco: Never  Vaping Use   Vaping Use: Never used  Substance and Sexual Activity   Alcohol use: Not Currently    Comment: no more than 4 glasses of red wine a week   Drug use: Not Currently    Frequency: 4.0 times per week    Types: Marijuana   Sexual activity: Not Currently    Birth control/protection: Post-menopausal  Other Topics Concern   Not on file  Social History Narrative   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   Social Determinants of Health   Financial Resource Strain: High Risk (01/22/2021)   Overall Financial Resource Strain (CARDIA)    Difficulty of Paying Living Expenses: Hard  Food Insecurity: No Food Insecurity (01/22/2021)   Hunger Vital Sign    Worried About Running Out of Food in the Last Year: Never true    Ran Out of Food in the Last Year: Never true  Transportation Needs: No Transportation Needs (01/22/2021)   PRAPARE - Administrator, Civil Service (Medical): No    Lack of Transportation (Non-Medical): No  Physical Activity: Not on file  Stress: Not on file  Social Connections: Not on file  Intimate Partner Violence: Not on file     PHYSICAL EXAM  GENERAL EXAM/CONSTITUTIONAL: Vitals:  Vitals:   12/23/21 1010  BP: 118/79  Pulse: 75  Weight: 150 lb (68 kg)  Height: 5\' 4"  (1.626 m)   Body mass index is 25.75 kg/m. Wt Readings from Last 3 Encounters:  12/23/21 150 lb (68 kg)  11/13/21 146 lb 3.2 oz (66.3 kg)  10/28/21 147 lb 9.6 oz (67 kg)   Patient is in no distress; well developed, nourished and groomed; neck is supple  CARDIOVASCULAR: Examination of carotid arteries is normal; no carotid bruits Regular rate and rhythm, no murmurs Examination of peripheral vascular system by observation and palpation is normal  EYES: Ophthalmoscopic exam of optic discs and posterior segments is normal; no papilledema or hemorrhages No results  found.  MUSCULOSKELETAL: Gait, strength, tone, movements noted in Neurologic exam below  NEUROLOGIC: MENTAL STATUS:      No data to display         awake, alert, oriented to person, place and time recent and remote memory intact normal attention and concentration language fluent, comprehension intact, naming intact fund of knowledge appropriate  CRANIAL NERVE:  2nd - no papilledema on fundoscopic exam 2nd, 3rd, 4th, 6th - pupils equal and reactive to light, visual fields full to confrontation,  extraocular muscles intact, no nystagmus 5th - facial sensation symmetric 7th - facial strength symmetric 8th - hearing intact 9th - palate elevates symmetrically, uvula midline 11th - shoulder shrug symmetric 12th - tongue protrusion midline  MOTOR:  normal bulk and tone, full strength in the BUE, BLE  SENSORY:  normal and symmetric to light touch, temperature, vibration; EXCEPT DECR IN AREA OF DOG BITE, RIGHT LATERAL THIGH  COORDINATION:  finger-nose-finger, fine finger movements normal  REFLEXES:  deep tendon reflexes present and symmetric  GAIT/STATION:  narrow based gait     DIAGNOSTIC DATA (LABS, IMAGING, TESTING) - I reviewed patient records, labs, notes, testing and imaging myself where available.  Lab Results  Component Value Date   WBC 5.6 04/29/2021   HGB 12.5 04/29/2021   HCT 37.7 04/29/2021   MCV 90.5 04/29/2021   PLT 208.0 04/29/2021      Component Value Date/Time   NA 141 04/29/2021 0917   NA 146 03/19/2021 0000   K 4.3 04/29/2021 0917   CL 103 04/29/2021 0917   CO2 30 04/29/2021 0917   GLUCOSE 97 04/29/2021 0917   BUN 9 04/29/2021 0917   BUN 8 03/19/2021 0000   CREATININE 0.75 04/29/2021 0917   CALCIUM 9.1 04/29/2021 0917   PROT 6.6 04/29/2021 0917   ALBUMIN 4.3 04/29/2021 0917   AST 16 04/29/2021 0917   ALT 13 04/29/2021 0917   ALKPHOS 88 04/29/2021 0917   BILITOT 0.3 04/29/2021 0917   GFRNONAA >60 03/27/2021 1205   Lab Results   Component Value Date   CHOL 176 10/28/2021   HDL 92 10/28/2021   LDLCALC 75 10/28/2021   LDLDIRECT 243 (H) 04/04/2021   TRIG 41 10/28/2021   CHOLHDL 1.9 10/28/2021   Lab Results  Component Value Date   HGBA1C 5.8 03/19/2021   No results found for: "VITAMINB12" Lab Results  Component Value Date   TSH 1.23 06/12/2021      ASSESSMENT AND PLAN  51 y.o. year old female here with:   Dx:  1. Dog bite of right thigh, sequela       PLAN:  RIGHT THIGH DOG BITE --> post-traumatic numbness, pain (likely lateral femoral cutaneous nerve of thigh and posterior femoral cutaneous nerve of thigh)  - prognosis difficult to determine; not much improvement in last 6 months; symptoms may take up to 1 year or longer to improve / stabilize; EMG/NCS not likely to help provide additional information in this situation  - may consider gabapentin 100-300mg  daily up to three times a day for neuropathic pain  Return for pending if symptoms worsen or fail to improve, return to PCP.    Suanne Marker, MD 12/23/2021, 10:20 AM Certified in Neurology, Neurophysiology and Neuroimaging  [facility] [address] [place], [place] [address] [phone]

## 2021-12-23 NOTE — Patient Instructions (Addendum)
  RIGHT THIGH DOG BITE --> post-traumatic numbness, pain (likely lateral femoral cutaneous nerve of thigh and posterior femoral cutaneous nerve of thigh)  - prognosis difficult to determine; not much improvement in last 6 months; symptoms may take up to 1 year or longer to improve / stabilize; EMG/NCS not likely to help provide additional information in this situation  - may consider gabapentin 100-300mg  daily up to three times a day for neuropathic pain

## 2022-01-07 ENCOUNTER — Telehealth: Payer: Self-pay

## 2022-01-07 ENCOUNTER — Ambulatory Visit: Payer: Commercial Managed Care - HMO | Attending: Internal Medicine | Admitting: Internal Medicine

## 2022-01-07 ENCOUNTER — Encounter: Payer: Self-pay | Admitting: Internal Medicine

## 2022-01-07 VITALS — BP 105/65 | HR 58

## 2022-01-07 DIAGNOSIS — E785 Hyperlipidemia, unspecified: Secondary | ICD-10-CM | POA: Diagnosis not present

## 2022-01-07 DIAGNOSIS — Z9861 Coronary angioplasty status: Secondary | ICD-10-CM

## 2022-01-07 DIAGNOSIS — I2510 Atherosclerotic heart disease of native coronary artery without angina pectoris: Secondary | ICD-10-CM

## 2022-01-07 DIAGNOSIS — E7801 Familial hypercholesterolemia: Secondary | ICD-10-CM

## 2022-01-07 DIAGNOSIS — I255 Ischemic cardiomyopathy: Secondary | ICD-10-CM

## 2022-01-07 NOTE — Progress Notes (Signed)
Virtual Visit via Video Note   Because of Ansleigh Duve's co-morbid illnesses, she is at least at moderate risk for complications without adequate follow up.  This format is felt to be most appropriate for this patient at this time.  All issues noted in this document were discussed and addressed.  A limited physical exam was performed with this format.  Please refer to the patient's chart for her consent to telehealth for [facility].      Date:  01/07/2022   ID:  Berniece Salines, DOB 1970-10-01, MRN [id number] The patient was identified using 2 identifiers.  Evaluation Performed:  New Patient Evaluation  Patient Location:  [place] [id number]  Provider location:   [address] [place],  [address]  PCP:  Eugenia Pancoast, [place]  Cardiologist:  None Electrophysiologist:  None   Chief Complaint:  FH  History of Present Illness:    Wanda Wood is a 51 y.o. female who presents via audio/video conferencing for a telehealth visit today.  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  LVEF at the time was 25 to 35% however subsequently normalized.  She is noted to have significant dyslipidemia which has been lifelong.  Initial cholesterol testing was in her 12s which indicated a total cholesterol over 400.  She had seen cardiologist or lipid specialist in [place] at the time and was started on Lipitor.  She took that for several years and did have some improvement in her lipids with a total cholesterol less than 300.  She also has strong family history of high cholesterol and early heart disease including her father who had an MI in his 11s and her grandfather (father's father died of an MI).  Labs while hospitalized in December 2022 showed total cholesterol 511, triglycerides 110, HDL 69 and LDL of 420.   Subsequently she has been on rosuvastatin 40 mg daily which she seems to tolerate although may have some joint pains.  Repeat lipid NMR in February 2023, demonstrated LDL particle number of 1943 with LDL-C of 249, HDL-C of 70, triglycerides 97 and total cholesterol 335.  Small LDL particle numbers were low at 244.  Surprisingly, her LP(a) is not significantly elevated at 109, but is high.  Overall her lipid profile is suggestive highly of homozygous familial hyperlipidemia or possibly a compound heterozygous lipid disorder.  There had been some discussion about genetic testing however previously she had declined as she has no children and no plans to have children as well as no siblings, however this information could be helpful as it might potentially change therapeutic options for her.  Overall she says she feels much better.  She has been exercising regularly.  Her LVEF as mentioned has normalized and her lipids are much lower.  I think this is all playing a role in how she feels.  01/07/2022  Ms. Roffers returns today for follow-up.  She has done extremely well on combination therapy with high-dose rosuvastatin and Repatha.  Recent lipids are markedly improved.  Total cholesterol now 176, triglycerides 41, HDL 82 and LDL 75.  This is a significant reduction considering her LDL was over 400 at 1 point.  She says she feels significantly better, has more energy and is less "angry".  She did note that she had some mixup in her medications and was taking  aspirin by itself for a while and had some swelling with this.  She wondered if it was an NSAID related allergy.  Subsequently she went on to Effient alone and says this has been better tolerated.  She is almost 1 year out from her PCI.  The patient does not have symptoms concerning for COVID-19 infection (fever, chills, cough, or new SHORTNESS OF BREATH).    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   Anxiety 1998   Spoke with ob/gyn. Prescription for Xanax for short period.   Arthritis 2005   Chiropractor mentioned inflammation throughout my spine   Depression 1987   Depressed in high school   High cholesterol    Myocardial infarction (HCC)    Systolic CHF, acute (HCC)     Past Surgical History:  Procedure Laterality Date   CERVICAL SPINE SURGERY     CORONARY STENT INTERVENTION N/A 01/20/2021   Procedure: CORONARY STENT INTERVENTION;  Surgeon: Runell Gess, MD;  Location: [facility]AS[facility];  Service: Cardiovascular;  Laterality: N/A;   LEFT HEART CATH AND CORONARY ANGIOGRAPHY N/A 01/20/2021   Procedure: LEFT HEART CATH AND CORONARY ANGIOGRAPHY;  Surgeon: Runell Gess, MD;  Location: [facility] INVASIVE CV LAB;  Service: Cardiovascular;  Laterality: N/A;   SPINE SURGERY  11/06/2010   Spinal fusion C5-C7    FAMHx:  Family History  Problem Relation Age of Onset   Thyroid disease Mother    Heart disease Father    Heart attack Father 72   Cancer Maternal Grandmother 21       uterine cancer    SOCHx:   reports that she has quit smoking. Her smoking use included cigarettes. She has a 1.50 pack-year smoking history. She has never used smokeless tobacco. She reports that she does not currently use alcohol. She reports that she does not currently use drugs after having used the following drugs: Marijuana. Frequency: 4.00 times per week.  ALLERGIES:  Allergies  Allergen Reactions   Lactose Intolerance (Gi) Other (See Comments)    GI upset, SOB   Soy Allergy Other (See Comments)    GI upset   Sulfa Antibiotics Nausea And Vomiting   Sulfites     Specifically mentioned as an allergy to an  ingredient in inhalers.    MEDS:  Current Meds  Medication Sig   acetaminophen (TYLENOL) 500 MG tablet Take 500 mg by mouth every 6 (six) hours as needed for moderate pain  or mild pain.   Evolocumab (REPATHA SURECLICK) 140 MG/ML SOAJ Inject 140 mg into the skin every 14 (fourteen) days.   furosemide (LASIX) 20 MG tablet Take 1 tablet (20 mg total) by mouth daily as needed. For weight gain 3lb overnight, 5lbs in a week, shortness of breath or leg edema   nitroGLYCERIN (NITROSTAT) 0.4 MG SL tablet Place 1 tablet (0.4 mg total) under the tongue every 5 (five) minutes x 3 doses as needed for chest pain.   prasugrel (EFFIENT) 10 MG TABS tablet Take 1 tablet (10 mg total) by mouth daily.   rosuvastatin (CRESTOR) 40 MG tablet Take 40 mg by mouth daily.     ROS: Pertinent items noted in HPI and remainder of comprehensive ROS otherwise negative.  Labs/Other Tests and Data Reviewed:    Recent Labs: 01/18/2021: Magnesium 2.1 03/27/2021: B  Natriuretic Peptide 33.7 04/29/2021: ALT 13; BUN 9; Creatinine, Ser 0.75; Hemoglobin 12.5; Platelets 208.0; Potassium 4.3; Sodium 141 06/12/2021: TSH 1.23   Recent Lipid Panel Lab Results  Component Value Date/Time   CHOL 176 10/28/2021 09:23 AM   TRIG 41 10/28/2021 09:23 AM   HDL 92 10/28/2021 09:23 AM   CHOLHDL 1.9 10/28/2021 09:23 AM   CHOLHDL 7.4 01/19/2021 12:20 AM   LDLCALC 75 10/28/2021 09:23 AM   LDLDIRECT 243 (H) 04/04/2021 03:42 PM    Wt Readings from Last 3 Encounters:  12/23/21 150 lb (68 kg)  11/13/21 146 lb 3.2 oz (66.3 kg)  10/28/21 147 lb 9.6 oz (67 kg)     Exam:    Vital Signs:  BP 105/65   Pulse (!) 58    General appearance: alert and no distress Lungs: No visual respiratory difficulty Abdomen: Normal weight Extremities: extremities normal, atraumatic, no cyanosis or edema Neurologic: Grossly normal  ASSESSMENT & PLAN:    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  Passi had a tremendous reduction in her lipids on her current combination therapy.  LDL now 75 which is near her ideal and it is hard to imagine she could get much lower since she was over 400 for LDL the past.  She feels much better clinically.  She is tolerating medications without side effects.  Will plan to continue this therapy with a repeat lipid NMR in 1 year.  I will let Dr. Davina Poke know that she is on Effient monotherapy currently and that she is considering staying on this long-term.  She said that previously she was not able to tolerate Plavix or Brilinta and feels like she might be allergic to aspirin.  COVID-19 Education: The signs and symptoms of COVID-19 were discussed with the patient and how to seek care for testing (follow up with PCP or arrange E-visit).  The importance of social distancing was discussed today.  Patient Risk:   After full review of this patients clinical status, I feel that they are at least moderate risk at this time.  Time:   Today, I have spent 15 minutes with the patient with telehealth technology discussing dyslipidemia.     Medication Adjustments/Labs and Tests Ordered: Current medicines are reviewed at length with the patient today.  Concerns regarding medicines are outlined above.   Tests Ordered: No orders of the defined types were placed in this encounter.   Medication Changes: No orders of the defined types were placed in this encounter.   Disposition:  in 1 year(s)  Pixie Casino, MD, [facility], [place] Director of the Advanced Lipid Disorders &  Cardiovascular Risk Reduction Clinic Diplomate of the American Board of Clinical Lipidology Attending Cardiologist  Direct Dial: [phone]  Fax: [phone]  Website:  [email or web address]  Pixie Casino, MD  01/07/2022 9:42 AM

## 2022-01-07 NOTE — Telephone Encounter (Signed)
Called patient to discuss AVS instructions gave Dr. Blanchie Dessert recommendations and patient voiced understanding. AVS summary mailed to patient along with lab order.

## 2022-01-07 NOTE — Addendum Note (Signed)
Addended by: Dorris Fetch on: 01/07/2022 10:02 AM   Modules accepted: Orders

## 2022-01-07 NOTE — Telephone Encounter (Signed)
  Patient Consent for Virtual Visit        Wanda Wood has provided verbal consent on 01/07/2022 for a virtual visit (video or telephone).   CONSENT FOR VIRTUAL VISIT FOR:  Wanda Wood  By participating in this virtual visit I agree to the following:  I hereby voluntarily request, consent and authorize [facility] and its employed or contracted physicians, physician assistants, nurse practitioners or other licensed health care professionals (the Practitioner), to provide me with telemedicine health care services (the "Services") as deemed necessary by the treating Practitioner. I acknowledge and consent to receive the Services by the Practitioner via telemedicine. I understand that the telemedicine visit will involve communicating with the Practitioner through live audiovisual communication technology and the disclosure of certain medical information by electronic transmission. I acknowledge that I have been given the opportunity to request an in-person assessment or other available alternative prior to the telemedicine visit and am voluntarily participating in the telemedicine visit.  I understand that I have the right to withhold or withdraw my consent to the use of telemedicine in the course of my care at any time, without affecting my right to future care or treatment, and that the Practitioner or I may terminate the telemedicine visit at any time. I understand that I have the right to inspect all information obtained and/or recorded in the course of the telemedicine visit and may receive copies of available information for a reasonable fee.  I understand that some of the potential risks of receiving the Services via telemedicine include:  Delay or interruption in medical evaluation due to technological equipment failure or disruption; Information transmitted may not be sufficient (e.g. poor resolution of images) to allow for appropriate medical decision making by the  Practitioner; and/or  In rare instances, security protocols could fail, causing a breach of personal health information.  Furthermore, I acknowledge that it is my responsibility to provide information about my medical history, conditions and care that is complete and accurate to the best of my ability. I acknowledge that Practitioner's advice, recommendations, and/or decision may be based on factors not within their control, such as incomplete or inaccurate data provided by me or distortions of diagnostic images or specimens that may result from electronic transmissions. I understand that the practice of medicine is not an exact science and that Practitioner makes no warranties or guarantees regarding treatment outcomes. I acknowledge that a copy of this consent can be made available to me via my patient portal [facility] MyChart), or I can request a printed copy by calling the office of [facility].    I understand that my insurance will be billed for this visit.   I have read or had this consent read to me. I understand the contents of this consent, which adequately explains the benefits and risks of the Services being provided via telemedicine.  I have been provided ample opportunity to ask questions regarding this consent and the Services and have had my questions answered to my satisfaction. I give my informed consent for the services to be provided through the use of telemedicine in my medical care

## 2022-01-07 NOTE — Patient Instructions (Signed)
Medication Instructions:  Your physician recommends that you continue on your current medications as directed. Please refer to the Current Medication list given to you today.  *If you need a refill on your cardiac medications before your next appointment, please call your pharmacy*  Lab Work: Your physician recommends that you return for lab work:  NMR  If you have labs (blood work) drawn today and your tests are completely normal, you will receive your results only by: MyChart Message (if you have MyChart) OR A paper copy in the mail If you have any lab test that is abnormal or we need to change your treatment, we will call you to review the results.  Testing/Procedures: NONE ordered at this time of appointment   Follow-Up: At [facility], you and your health needs are our priority.  As part of our continuing mission to provide you with exceptional heart care, we have created designated Provider Care Teams.  These Care Teams include your primary Cardiologist (physician) and Advanced Practice Providers (APPs -  Physician Assistants and Nurse Practitioners) who all work together to provide you with the care you need, when you need it.   Your next appointment:   1 year(s)  The format for your next appointment:   Virtual Visit   Provider:   Dr. Rennis Golden     Other Instructions  Important Information About Sugar

## 2022-01-13 ENCOUNTER — Ambulatory Visit: Payer: Commercial Managed Care - HMO | Admitting: Diagnostic Neuroimaging

## 2022-01-21 NOTE — Progress Notes (Signed)
Cardiology Office Note:   Date:  01/26/2022  NAME:  Wanda Wood    MRN: [id number] DOB:  1970-03-12   PCP:  Mort Sawyers, FNP  Cardiologist:  None  Electrophysiologist:  None   Referring MD: Mort Sawyers, FNP   Chief Complaint  Patient presents with   Follow-up   History of Present Illness:   Wanda Wood is a 51 y.o. female with a hx of CAD, ICM, HLD who presents for follow-up.  She reports she is doing well.  Lipids are well-controlled.  BP is stable.  She had slight jaw discomfort that was improved with nitroglycerin 2 weeks ago.  Symptoms have resolved.  Suspect this was an isolated incident.  CV examination normal.  Overall she is doing well.  Problem List CAD/NSTEMI -01/20/2021 -PCI to pLAD 2. Ischemic CM with recovery of EF -EF 35-40% -EF 55-60% 04/09/2021 3. 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   Anxiety 1998   Spoke with ob/gyn. Prescription for Xanax for short period.   Arthritis 2005   Chiropractor mentioned inflammation throughout my spine   Depression 1987   Depressed in high school   High cholesterol    Myocardial infarction (HCC)    Systolic CHF, acute (HCC)     Past Surgical History: Past Surgical History:  Procedure Laterality Date   CERVICAL SPINE SURGERY     CORONARY STENT INTERVENTION N/A 01/20/2021   Procedure: CORONARY STENT INTERVENTION;  Surgeon: Runell Gess, MD;  Location: [facility];  Service: Cardiovascular;  Laterality: N/A;   LEFT HEART CATH AND CORONARY ANGIOGRAPHY N/A 01/20/2021   Procedure: LEFT HEART CATH AND CORONARY ANGIOGRAPHY;  Surgeon: Runell Gess, MD;  Location: [facility];  Service: Cardiovascular;  Laterality: N/A;   SPINE SURGERY  11/06/2010   Spinal fusion C5-C7    Current Medications: Current Meds  Medication Sig    acetaminophen (TYLENOL) 500 MG tablet Take 500 mg by mouth every 6 (six) hours as needed for moderate pain or mild pain.   busPIRone (BUSPAR) 5 MG tablet Take 1 tablet (5 mg total) by mouth 2 (two) times daily.   Evolocumab (REPATHA SURECLICK) 140 MG/ML SOAJ Inject 140 mg into the skin every 14 (fourteen) days.   furosemide (LASIX) 20 MG tablet Take 1 tablet (20 mg total) by mouth daily as needed. For weight gain 3lb overnight, 5lbs in a week, shortness of breath or leg edema   nitroGLYCERIN (NITROSTAT) 0.4 MG SL tablet Place 1 tablet (0.4 mg total) under the tongue every 5 (five) minutes x 3 doses as needed for chest pain.   prasugrel (EFFIENT) 10 MG TABS tablet Take 1 tablet (10 mg total) by mouth daily.   rosuvastatin (CRESTOR) 40 MG tablet Take 40 mg by mouth daily.     Allergies:    Lactose intolerance (gi), Soy allergy, Sulfa antibiotics, and Sulfites   Social History: Social History   Socioeconomic History   Marital status: Single    Spouse name: Not on file   Number of children: 0   Years of education: Not on file   Highest education level: Not on file  Occupational History   Occupation: bartender/waitress    Comment: part time  Tobacco Use   Smoking status: Former    Packs/day: 0.50    Years: 3.00  Total pack years: 1.50    Types: Cigarettes   Smokeless tobacco: Never  Vaping Use   Vaping Use: Never used  Substance and Sexual Activity   Alcohol use: Not Currently    Comment: no more than 4 glasses of red wine a week   Drug use: Not Currently    Frequency: 4.0 times per week    Types: Marijuana   Sexual activity: Not Currently    Birth control/protection: Post-menopausal  Other Topics Concern   Not on file  Social History Narrative   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   Social Determinants of Health   Financial Resource Strain: High Risk (01/22/2021)   Overall Financial Resource Strain (CARDIA)    Difficulty of Paying  Living Expenses: Hard  Food Insecurity: No Food Insecurity (01/22/2021)   Hunger Vital Sign    Worried About Running Out of Food in the Last Year: Never true    Ran Out of Food in the Last Year: Never true  Transportation Needs: No Transportation Needs (01/22/2021)   PRAPARE - Hydrologist (Medical): No    Lack of Transportation (Non-Medical): No  Physical Activity: Not on file  Stress: Not on file  Social Connections: Not on file     Family History: The patient's family history includes Cancer (age of onset: 65) in her maternal grandmother; Heart attack (age of onset: 14) in her father; Heart disease in her father; Thyroid disease in her mother.  ROS:   All other ROS reviewed and negative. Pertinent positives noted in the HPI.     EKGs/Labs/Other Studies Reviewed:   The following studies were personally reviewed by me today:  EKG:  EKG is ordered today.  The ekg ordered today demonstrates normal sinus rhythm heart rate 69, no acute ischemic changes or evidence of infarction, and was personally reviewed by me.   NM Stress 07/17/2021   The study is normal. The study is low risk.   Subt;e horizontal ST depression (II and aVF) was noted during stress.   LV perfusion is normal.   Left ventricular function is normal. Nuclear stress EF: 59 %. The left ventricular ejection fraction is normal (55-65%). End diastolic cavity size is normal.   Prior study not available for comparison.        Recent Labs: 03/27/2021: B Natriuretic Peptide 33.7 04/29/2021: ALT 13; BUN 9; Creatinine, Ser 0.75; Hemoglobin 12.5; Platelets 208.0; Potassium 4.3; Sodium 141 06/12/2021: TSH 1.23   Recent Lipid Panel    Component Value Date/Time   CHOL 176 10/28/2021 0923   TRIG 41 10/28/2021 0923   HDL 92 10/28/2021 0923   CHOLHDL 1.9 10/28/2021 0923   CHOLHDL 7.4 01/19/2021 0020   VLDL 22 01/19/2021 0020   LDLCALC 75 10/28/2021 0923   LDLDIRECT 243 (H) 04/04/2021 1542    Physical  Exam:   VS:  BP 112/68   Pulse 69   Ht 5\' 4"  (1.626 m)   Wt 147 lb 3.2 oz (66.8 kg)   SpO2 97%   BMI 25.27 kg/m    Wt Readings from Last 3 Encounters:  01/26/22 147 lb 3.2 oz (66.8 kg)  12/23/21 150 lb (68 kg)  11/13/21 146 lb 3.2 oz (66.3 kg)    General: Well nourished, well developed, in no acute distress Head: Atraumatic, normal size  Eyes: PEERLA, EOMI  Neck: Supple, no JVD Endocrine: No thryomegaly Cardiac: Normal S1, S2; RRR; no murmurs,  rubs, or gallops Lungs: Clear to auscultation bilaterally, no wheezing, rhonchi or rales  Abd: Soft, nontender, no hepatomegaly  Ext: No edema, pulses 2+ Musculoskeletal: No deformities, BUE and BLE strength normal and equal Skin: Warm and dry, no rashes   Neuro: Alert and oriented to person, place, time, and situation, CNII-XII grossly intact, no focal deficits  Psych: Normal mood and affect   ASSESSMENT:   Wanda Wood is a 51 y.o. female who presents for the following: 1. Homozygous familial hypercholesterolemia   2. Ischemic cardiomyopathy   3. Coronary artery disease involving native coronary artery of native heart without angina pectoris     PLAN:   1. Homozygous familial hypercholesterolemia 2. Ischemic cardiomyopathy 3. Coronary artery disease involving native coronary artery of native heart without angina pectoris -Non-STEMI December 2022.  Complicated by ischemic cardiomyopathy with recovered ejection fraction.  She does describe some symptoms concerning for angina but have improved.  Could be stress related.  Stress test in June was normal.  Will continue to monitor this for now.  Would also consider cardiac PET if she has symptoms again to rule out microvascular disease.  She is intolerant of aspirin per her report.  She reports she is tolerating Effient.  She did not tolerate Plavix or Brilinta.  Would recommend to continue Effient as monotherapy for now.  Her blood pressure is normal.  She does not need to be on other  medications.  Her lipids are well-controlled on Repatha and Crestor.  LDL now 75.  Continue this.  She will see me in 6 months.     Disposition: Return in about 6 months (around 07/28/2022).  Medication Adjustments/Labs and Tests Ordered: Current medicines are reviewed at length with the patient today.  Concerns regarding medicines are outlined above.  Orders Placed This Encounter  Procedures   EKG 12-Lead   No orders of the defined types were placed in this encounter.   Patient Instructions  Medication Instructions:  The current medical regimen is effective;  continue present plan and medications.  *If you need a refill on your cardiac medications before your next appointment, please call your pharmacy*   Follow-Up: At [facility], you and your health needs are our priority.  As part of our continuing mission to provide you with exceptional heart care, we have created designated Provider Care Teams.  These Care Teams include your primary Cardiologist (physician) and Advanced Practice Providers (APPs -  Physician Assistants and Nurse Practitioners) who all work together to provide you with the care you need, when you need it.  We recommend signing up for the patient portal called "MyChart".  Sign up information is provided on this After Visit Summary.  MyChart is used to connect with patients for Virtual Visits (Telemedicine).  Patients are able to view lab/test results, encounter notes, upcoming appointments, etc.  Non-urgent messages can be sent to your provider as well.   To learn more about what you can do with MyChart, go to [email or web address].    Your next appointment:   6 month(s)  The format for your next appointment:   In Person  Provider:   Eleonore Chiquito, MD         Time Spent with Patient: I have spent a total of 25 minutes with patient reviewing hospital notes, telemetry, EKGs, labs and examining the patient as well as establishing an assessment  and plan that was discussed with the patient.  > 50% of time was spent in direct patient care.  Signed, Addison Naegeli. Audie Box, MD, [place]  [address], [place], [place] [address] [phone]  01/26/2022 10:08 AM

## 2022-01-25 LAB — NMR, LIPOPROFILE
Cholesterol, Total: 188 mg/dL (ref 100–199)
HDL Particle Number: 39.8 umol/L (ref >=30.5)
HDL-C: 101 mg/dL (ref >39)
LDL Particle Number: 503 nmol/L (ref <1000)
LDL Size: 21 nm (ref >20.5)
LDL-C (NIH Calc): 78 mg/dL (ref 0–99)
LP-IR Score: <25 (ref <=45)
Small LDL Particle Number: <90 nmol/L (ref <=527)
Triglycerides: 45 mg/dL (ref 0–149)

## 2022-01-26 ENCOUNTER — Encounter: Payer: Self-pay | Admitting: Cardiovascular Disease

## 2022-01-26 ENCOUNTER — Ambulatory Visit: Payer: Commercial Managed Care - HMO | Attending: Cardiovascular Disease | Admitting: Cardiovascular Disease

## 2022-01-26 VITALS — BP 112/68 | HR 69 | Ht 64.0 in | Wt 147.2 lb

## 2022-01-26 DIAGNOSIS — I2510 Atherosclerotic heart disease of native coronary artery without angina pectoris: Secondary | ICD-10-CM

## 2022-01-26 DIAGNOSIS — E7801 Familial hypercholesterolemia: Secondary | ICD-10-CM | POA: Diagnosis not present

## 2022-01-26 DIAGNOSIS — I255 Ischemic cardiomyopathy: Secondary | ICD-10-CM | POA: Diagnosis not present

## 2022-01-26 NOTE — Patient Instructions (Signed)
Medication Instructions:  The current medical regimen is effective;  continue present plan and medications.  *If you need a refill on your cardiac medications before your next appointment, please call your pharmacy*  Follow-Up: At [facility], you and your health needs are our priority.  As part of our continuing mission to provide you with exceptional heart care, we have created designated Provider Care Teams.  These Care Teams include your primary Cardiologist (physician) and Advanced Practice Providers (APPs -  Physician Assistants and Nurse Practitioners) who all work together to provide you with the care you need, when you need it.  We recommend signing up for the patient portal called "MyChart".  Sign up information is provided on this After Visit Summary.  MyChart is used to connect with patients for Virtual Visits (Telemedicine).  Patients are able to view lab/test results, encounter notes, upcoming appointments, etc.  Non-urgent messages can be sent to your provider as well.   To learn more about what you can do with MyChart, go to [email or web address].    Your next appointment:   6 month(s)  The format for your next appointment:   In Person  Provider:   Poynor O'Neal, MD      

## 2022-03-05 ENCOUNTER — Other Ambulatory Visit: Payer: Self-pay | Admitting: Cardiovascular Disease

## 2022-03-27 ENCOUNTER — Encounter: Payer: Self-pay | Admitting: Cardiovascular Disease

## 2022-04-05 NOTE — Progress Notes (Unsigned)
Cardiology Office Note:   Date:  04/06/2022  NAME:  Wanda Wood    MRN: [id number] DOB:  [birth year]   PCP:  Eugenia Pancoast, FNP  Cardiologist:  None  Electrophysiologist:  None   Referring MD: Eugenia Pancoast, FNP   Chief Complaint  Patient presents with   Follow-up        History of Present Illness:   Wanda Wood is a 52 y.o. female with a hx of HLD, CAD, who presents for follow-up.  She reports she went to [facility].  While there had some nosebleeding.  Symptoms occur for roughly 2 weeks.  Every time she blew her nose she would notice blood-tinged sputum.  She reports that she stop the Effient and symptoms improved.  She cannot take aspirin.  She had issues with clopidogrel as well as ticagrelor.  She has tolerated Effient.  She denies any chest pain or trouble breathing.  Vital signs are stable.  She also had issues with Repatha.  Apparently her co-pay was quite too high.  Will have her fill out the patient assistance form.  Problem List CAD/NSTEMI -01/20/2021 -PCI to pLAD 2. Ischemic CM with recovery of EF -EF 35-40% -EF 55-60% 04/09/2021 3. 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   Anxiety 1998   Spoke with ob/gyn. Prescription for Xanax for short period.   Arthritis 2005   Chiropractor mentioned inflammation throughout my spine   Depression 1987   Depressed in high school   High cholesterol    Myocardial infarction ([facility]    Preventative health care [id number]  Systolic CHF, acute ([facility]     Past Surgical History: Past Surgical History:  Procedure Laterality Date   CERVICAL SPINE SURGERY     CORONARY STENT INTERVENTION N/A 01/20/2021   Procedure: CORONARY STENT INTERVENTION;  Surgeon: BLorretta Harp MD;  [facility];  Service: Cardiovascular;  Laterality: N/A;   LEFT  HEART CATH AND CORONARY ANGIOGRAPHY N/A 01/20/2021   Procedure: LEFT HEART CATH AND CORONARY ANGIOGRAPHY;  Surgeon: BLorretta Harp MD;  [facility];  Service: Cardiovascular;  Laterality: N/A;   SPINE SURGERY  11/06/2010   Spinal fusion C5-C7    Current Medications: Current Meds  Medication Sig   acetaminophen (TYLENOL) 500 MG tablet Take 500 mg by mouth every 6 (six) hours as needed for moderate pain or mild pain.   furosemide (LASIX) 20 MG tablet Take 1 tablet (20 mg total) by mouth daily as needed. For weight gain 3lb overnight, 5lbs in a week, shortness of breath or leg edema   nitroGLYCERIN (NITROSTAT) 0.4 MG SL tablet Place 1 tablet (0.4 mg total) under the tongue every 5 (five) minutes x 3 doses as needed for chest pain.   prasugrel (EFFIENT) 10 MG TABS tablet Take 1 tablet (10 mg total) by mouth daily.   rosuvastatin (CRESTOR) 40 MG tablet Take 1 tablet by mouth once daily     Allergies:    Lactose intolerance (gi), Soy allergy, Sulfa antibiotics, and Sulfites   Social History: Social History   Socioeconomic History   Marital status: Single    Spouse name: Not on file   Number of children: 0   Years of education: Not on file   Highest education level: Not on file  Occupational History  Occupation: bartender/waitress    Comment: part time  Tobacco Use   Smoking status: Former    Packs/day: 0.50    Years: 3.00    Total pack years: 1.50    Types: Cigarettes   Smokeless tobacco: Never  Vaping Use   Vaping Use: Never used  Substance and Sexual Activity   Alcohol use: Not Currently    Comment: no more than 4 glasses of red wine a week   Drug use: Not Currently    Frequency: 4.0 times per week    Types: Marijuana   Sexual activity: Not Currently    Birth control/protection: Post-menopausal  Other Topics Concern   Not on file  Social History Narrative   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   Social  Determinants of Health   Financial Resource Strain: High Risk (01/22/2021)   Overall Financial Resource Strain (CARDIA)    Difficulty of Paying Living Expenses: Hard  Food Insecurity: No Food Insecurity (01/22/2021)   Hunger Vital Sign    Worried About Running Out of Food in the Last Year: Never true    Ran Out of Food in the Last Year: Never true  Transportation Needs: No Transportation Needs (01/22/2021)   PRAPARE - Hydrologist (Medical): No    Lack of Transportation (Non-Medical): No  Physical Activity: Not on file  Stress: Not on file  Social Connections: Not on file     Family History: The patient's family history includes Cancer (age of onset: 24) in her maternal grandmother; Heart attack (age of onset: 15) in her father; Heart disease in her father; Thyroid disease in her mother.  ROS:   All other ROS reviewed and negative. Pertinent positives noted in the HPI.     EKGs/Labs/Other Studies Reviewed:   The following studies were personally reviewed by me today:  TTE 04/09/2021  1. Left ventricular ejection fraction, by estimation, is 55 to 60%. Left  ventricular ejection fraction by 3D volume is 59 %. The left ventricle has  normal function. The left ventricle has no regional wall motion  abnormalities. Left ventricular diastolic   parameters were normal.   2. Right ventricular systolic function is normal. The right ventricular  size is normal. There is normal pulmonary artery systolic pressure. The  estimated right ventricular systolic pressure is 123456 mmHg.   3. The mitral valve is normal in structure. Trivial mitral valve  regurgitation. No evidence of mitral stenosis.   4. The aortic valve is tricuspid. Aortic valve regurgitation is mild.  Aortic valve sclerosis is present, with no evidence of aortic valve  stenosis. Aortic regurgitation PHT measures 460 msec.   5. The inferior vena cava is normal in size with greater than 50%  respiratory  variability, suggesting right atrial pressure of 3 mmHg.   6. Compared to study dated 01/21/2021, LVF has normalized.   NM Stress 07/17/2021    The study is normal. The study is low risk.   Subt;e horizontal ST depression (II and aVF) was noted during stress.   LV perfusion is normal.   Left ventricular function is normal. Nuclear stress EF: 59 %. The left ventricular ejection fraction is normal (55-65%). End diastolic cavity size is normal.   Prior study not available for comparison.  Recent Labs: 04/29/2021: ALT 13; BUN 9; Creatinine, Ser 0.75; Hemoglobin 12.5; Platelets 208.0; Potassium 4.3; Sodium 141 06/12/2021: TSH 1.23   Recent Lipid Panel  Component Value Date/Time   CHOL 176 10/28/2021 0923   TRIG 41 10/28/2021 0923   HDL 92 10/28/2021 0923   CHOLHDL 1.9 10/28/2021 0923   CHOLHDL 7.4 01/19/2021 0020   VLDL 22 01/19/2021 0020   LDLCALC 75 10/28/2021 0923   LDLDIRECT 243 (H) 04/04/2021 1542    Physical Exam:   VS:  BP 114/60   Pulse 61   Ht '5\' 4"'$  (1.626 m)   Wt 149 lb 3.2 oz (67.7 kg)   SpO2 99%   BMI 25.61 kg/m    Wt Readings from Last 3 Encounters:  04/06/22 149 lb 3.2 oz (67.7 kg)  01/26/22 147 lb 3.2 oz (66.8 kg)  12/23/21 150 lb (68 kg)    General: Well nourished, well developed, in no acute distress Head: Atraumatic, normal size  Eyes: PEERLA, EOMI  Neck: Supple, no JVD Endocrine: No thryomegaly Cardiac: Normal S1, S2; RRR; no murmurs, rubs, or gallops Lungs: Clear to auscultation bilaterally, no wheezing, rhonchi or rales  Abd: Soft, nontender, no hepatomegaly  Ext: No edema, pulses 2+ Musculoskeletal: No deformities, BUE and BLE strength normal and equal Skin: Warm and dry, no rashes   Neuro: Alert and oriented to person, place, time, and situation, CNII-XII grossly intact, no focal deficits  Psych: Normal mood and affect   ASSESSMENT:   Wanda Wood is a 52 y.o. female who presents for the following: 1. Homozygous familial hypercholesterolemia    2. Ischemic cardiomyopathy   3. Coronary artery disease involving native coronary artery of native heart without angina pectoris     PLAN:   1. Homozygous familial hypercholesterolemia 2. Ischemic cardiomyopathy 3. Coronary artery disease involving native coronary artery of native heart without angina pectoris -Suffered high risk non-STEMI.  Has systolic dysfunction which has improved.  EF has normalized.  She did not tolerate aspirin, Plavix or ticagrelor.  Had nosebleeds with Effient.  She is willing to retry this.  I suspect [place].  We will retry Effient and she will see how this goes.  If this nosebleed issue continues would recommend to Brilinta 60 mg twice daily.  This is a reduced dose which is associated with lower CV events.  We also discussed vasa lower which could be an option.  She will see me back in 6 months to discuss further.  She has been prescribed Repatha.  Apparently her co-pay was too high.  We will have her fill out the patient assistance form.      Disposition: Return in about 6 months (around 10/05/2022).  Medication Adjustments/Labs and Tests Ordered: Current medicines are reviewed at length with the patient today.  Concerns regarding medicines are outlined above.  No orders of the defined types were placed in this encounter.  Meds ordered this encounter  Medications   prasugrel (EFFIENT) 10 MG TABS tablet    Sig: Take 1 tablet (10 mg total) by mouth daily.    Dispense:  90 tablet    Refill:  3    Patient Instructions  Medication Instructions:  Retry Effient (let us know if you have any issues)   *If you need a refill on your cardiac medications before your next appointment, please call your pharmacy*  Follow-Up: At [facility], you and your health needs are our priority.  As part of our continuing mission to provide you with exceptional heart care, we have created designated  Provider Care Teams.  These Care Teams  include your primary Cardiologist (physician) and Advanced Practice Providers (APPs -  Physician Assistants and Nurse Practitioners) who all work together to provide you with the care you need, when you need it.  We recommend signing up for the patient portal called "MyChart".  Sign up information is provided on this After Visit Summary.  MyChart is used to connect with patients for Virtual Visits (Telemedicine).  Patients are able to view lab/test results, encounter notes, upcoming appointments, etc.  Non-urgent messages can be sent to your provider as well.   To learn more about what you can do with MyChart, go to [email or web address].    Your next appointment:   6 month(s)  Provider:   Eleonore Chiquito, MD     Time Spent with Patient: I have spent a total of 25 minutes with patient reviewing hospital notes, telemetry, EKGs, labs and examining the patient as well as establishing an assessment and plan that was discussed with the patient.  > 50% of time was spent in direct patient care.  Signed, Addison Naegeli. Audie Box, MD, [place]  [address], [place], [place] [address] [phone]  04/06/2022 11:36 AM

## 2022-04-06 ENCOUNTER — Encounter: Payer: Self-pay | Admitting: Cardiovascular Disease

## 2022-04-06 ENCOUNTER — Ambulatory Visit: Payer: Commercial Managed Care - HMO | Attending: Cardiovascular Disease | Admitting: Cardiovascular Disease

## 2022-04-06 VITALS — BP 114/60 | HR 61 | Ht 64.0 in | Wt 149.2 lb

## 2022-04-06 DIAGNOSIS — E7801 Familial hypercholesterolemia: Secondary | ICD-10-CM | POA: Diagnosis not present

## 2022-04-06 DIAGNOSIS — I255 Ischemic cardiomyopathy: Secondary | ICD-10-CM

## 2022-04-06 DIAGNOSIS — I2510 Atherosclerotic heart disease of native coronary artery without angina pectoris: Secondary | ICD-10-CM

## 2022-04-06 MED ORDER — PRASUGREL HCL 10 MG PO TABS: 10.0000 mg | ORAL_TABLET | Freq: Every day | ORAL | 3 refills | Status: DC

## 2022-04-06 NOTE — Patient Instructions (Signed)
Medication Instructions:  Retry Effient (let us know if you have any issues)   *If you need a refill on your cardiac medications before your next appointment, please call your pharmacy*  Follow-Up: At [facility], you and your health needs are our priority.  As part of our continuing mission to provide you with exceptional heart care, we have created designated Provider Care Teams.  These Care Teams include your primary Cardiologist (physician) and Advanced Practice Providers (APPs -  Physician Assistants and Nurse Practitioners) who all work together to provide you with the care you need, when you need it.  We recommend signing up for the patient portal called "MyChart".  Sign up information is provided on this After Visit Summary.  MyChart is used to connect with patients for Virtual Visits (Telemedicine).  Patients are able to view lab/test results, encounter notes, upcoming appointments, etc.  Non-urgent messages can be sent to your provider as well.   To learn more about what you can do with MyChart, go to [email or web address].    Your next appointment:   6 month(s)  Provider:   Eleonore Chiquito, MD

## 2022-06-02 ENCOUNTER — Other Ambulatory Visit: Payer: Self-pay | Admitting: Cardiovascular Disease

## 2022-06-15 ENCOUNTER — Encounter: Payer: Self-pay | Admitting: Family

## 2022-06-15 ENCOUNTER — Ambulatory Visit: Payer: Commercial Managed Care - HMO | Admitting: Family

## 2022-06-15 VITALS — BP 118/62 | HR 66 | Temp 97.7°F | Ht 64.0 in | Wt 152.8 lb

## 2022-06-15 DIAGNOSIS — F411 Generalized anxiety disorder: Secondary | ICD-10-CM

## 2022-06-15 DIAGNOSIS — E782 Mixed hyperlipidemia: Secondary | ICD-10-CM | POA: Diagnosis not present

## 2022-06-15 DIAGNOSIS — E881 Lipodystrophy, not elsewhere classified: Secondary | ICD-10-CM | POA: Insufficient documentation

## 2022-06-15 DIAGNOSIS — R600 Localized edema: Secondary | ICD-10-CM

## 2022-06-15 MED ORDER — FUROSEMIDE 20 MG PO TABS: 20.0000 mg | ORAL_TABLET | Freq: Every day | ORAL | 1 refills | Status: AC | PRN

## 2022-06-15 MED ORDER — ALPRAZOLAM 0.25 MG PO TABS: 0.2500 mg | ORAL_TABLET | Freq: Every evening | ORAL | 0 refills | Status: DC | PRN

## 2022-06-15 NOTE — Assessment & Plan Note (Addendum)
Improved  Possible arthralgias from crestor, however trying to get approval for cost with repatha with her cardiology office. Pt states she has a form she is to return to try for reduced price.

## 2022-06-15 NOTE — Assessment & Plan Note (Signed)
Pt declines therapy  Did d/w her mindfullness  Pdmp reviewed refill alprazolam

## 2022-06-15 NOTE — Assessment & Plan Note (Signed)
Furosemide sent in for refill to use prn  Also advised to use compression stockings as well as elevate legs when this occurs Advised to watch sodium in diet

## 2022-06-15 NOTE — Progress Notes (Signed)
Established Patient Office Visit  Subjective:      CC:  Chief Complaint  Patient presents with   Medical Management of Chronic Issues    HPI: Wanda Wood is a 52 y.o. female presenting on 06/15/2022 for Medical Management of Chronic Issues . Anxiety state: lives with her mom which increases her stress.  She states gets agitated really easy. She uses the alprazolam rarely however.  Doesn't want to pursue therapy. Goal for her she states is to increase her social circle.    Lab Results  Component Value Date   CHOL 176 10/28/2021   HDL 92 10/28/2021   LDLCALC 75 10/28/2021   LDLDIRECT 243 (H) 04/04/2021   TRIG 41 10/28/2021   CHOLHDL 1.9 10/28/2021   Pedal edema: isn't all of the time but mainly after a long day of standing while at work. She uses furosemide prn      Social history:  Relevant past medical, surgical, family and social history reviewed and updated as indicated. Interim medical history since our last visit reviewed.  Allergies and medications reviewed and updated.  DATA REVIEWED: CHART IN EPIC     ROS: Negative unless specifically indicated above in HPI.    Current Outpatient Medications:    acetaminophen (TYLENOL) 500 MG tablet, Take 500 mg by mouth every 6 (six) hours as needed for moderate pain or mild pain., Disp: , Rfl:    nitroGLYCERIN (NITROSTAT) 0.4 MG SL tablet, Place 1 tablet (0.4 mg total) under the tongue every 5 (five) minutes x 3 doses as needed for chest pain., Disp: 25 tablet, Rfl: 2   prasugrel (EFFIENT) 10 MG TABS tablet, Take 1 tablet (10 mg total) by mouth daily., Disp: 90 tablet, Rfl: 3   rosuvastatin (CRESTOR) 40 MG tablet, Take 1 tablet by mouth once daily, Disp: 90 tablet, Rfl: 2   ALPRAZolam (XANAX) 0.25 MG tablet, Take 1 tablet (0.25 mg total) by mouth at bedtime as needed for anxiety., Disp: 30 tablet, Rfl: 0   furosemide (LASIX) 20 MG tablet, Take 1 tablet (20 mg total) by mouth daily as needed. For weight gain 3lb  overnight, 5lbs in a week, shortness of breath or leg edema, Disp: 30 tablet, Rfl: 1      Objective:    BP 118/62 (BP Location: Left Arm)   Pulse 66   Temp 97.7 F (36.5 C) (Temporal)   Ht 5\' 4"  (1.626 m)   Wt 152 lb 12.8 oz (69.3 kg)   SpO2 98%   BMI 26.23 kg/m   Wt Readings from Last 3 Encounters:  06/15/22 152 lb 12.8 oz (69.3 kg)  04/06/22 149 lb 3.2 oz (67.7 kg)  01/26/22 147 lb 3.2 oz (66.8 kg)    Physical Exam Constitutional:      General: She is not in acute distress.    Appearance: Normal appearance. She is normal weight. She is not ill-appearing, toxic-appearing or diaphoretic.  HENT:     Head: Normocephalic.  Cardiovascular:     Rate and Rhythm: Normal rate and regular rhythm.     Comments: Non pitting bil ankle edema, mild Pulmonary:     Effort: Pulmonary effort is normal.  Musculoskeletal:        General: Normal range of motion.     Right lower leg: 1+ Edema present.  Neurological:     General: No focal deficit present.     Mental Status: She is alert and oriented to person, place, and time. Mental status is at baseline.  Psychiatric:        Mood and Affect: Mood normal.        Behavior: Behavior normal.        Thought Content: Thought content normal.        Judgment: Judgment normal.           Assessment & Plan:  Pedal edema Assessment & Plan: Furosemide sent in for refill to use prn  Also advised to use compression stockings as well as elevate legs when this occurs Advised to watch sodium in diet   Orders: -     Furosemide; Take 1 tablet (20 mg total) by mouth daily as needed. For weight gain 3lb overnight, 5lbs in a week, shortness of breath or leg edema  Dispense: 30 tablet; Refill: 1  Anxiety state -     ALPRAZolam; Take 1 tablet (0.25 mg total) by mouth at bedtime as needed for anxiety.  Dispense: 30 tablet; Refill: 0  Mixed hyperlipidemia Assessment & Plan: Improved  Possible arthralgias from crestor, however trying to get approval  for cost with repatha with her cardiology office. Pt states she has a form she is to return to try for reduced price.    GAD (generalized anxiety disorder) Assessment & Plan: Pt declines therapy  Did d/w her mindfullness  Pdmp reviewed refill alprazolam      Return in about 6 months (around 12/16/2022) for f/u CPE.  Mort Sawyers, MSN, APRN, FNP-C [facility]

## 2022-07-13 ENCOUNTER — Telehealth: Payer: Self-pay | Admitting: Cardiovascular Disease

## 2022-07-13 NOTE — Telephone Encounter (Signed)
Pt sent this Via mychart to the scheduling pool:     I HAD severe neck & shoulder pain on left side most of last week, along with headaches. [place] also [place] the [facility] [place] all [place].   I do not have chest pain. I do not have nausea. I have slight shoulder & neck pain on the left side this morning. I have not taken nitroglycerin since 05-31.     Good Monring Jesslyn,  Can you answer a couple of question for me so I can forward this to our triage nurse?     1. Are you having CP right now?   2. Are you experiencing any other symptoms (ex. SOB, nausea, vomiting, sweating)?   3. How long have you been experiencing CP?   4. Is your CP continuous or coming and going?   5. Have you taken Nitroglycerin?  ?

## 2022-07-13 NOTE — Telephone Encounter (Signed)
Pt sent this Via mychart to the scheduling pool:       I HAD severe neck & shoulder pain on left side most of last week, along with headaches. Was also in the [facility] mountains all last week.    I do not have chest pain. I do not have nausea. I have slight shoulder & neck pain on the left side this morning. I have not taken nitroglycerin since 05-31.        Good Monring Tyera,  Can you answer a couple of question for me so I can forward this to our triage nurse?       1. Are you having CP right now?    2. Are you experiencing any other symptoms (ex. SOB, nausea, vomiting, sweating)?    3. How long have you been experiencing CP?    4. Is your CP continuous or coming and going?    5. Have you taken Nitroglycerin?     Patient stated she had jaw, neck and shoulder pain since last week. She took tylenol for a couple of nights and this did not relive her pain. On 5/30 pt went for a walk 2.5 to 3 miles, she felt fine, the following morning she felt really bad jaw,neck, and shoulder pain. She did take a nitroglycerin, symptoms resolved and she slept most of the day. Pt stated she symptoms are similar to what she experienced prior to her previous heart attach in 2022. She is currently asymptomatic, explained ED precautions, and pt voiced understanding. Pt is scheduled with APP on 6/4.

## 2022-07-13 NOTE — Telephone Encounter (Signed)
Call to patient, goes straight to VM.  LM to return call to office

## 2022-07-13 NOTE — Telephone Encounter (Signed)
Call back to patient, does not ring goes straight to VM

## 2022-07-13 NOTE — Telephone Encounter (Signed)
Pt returning call.. She states something is wrong with her phone. She states if she does not answer, can someone send her a Clinical cytogeneticist message instead

## 2022-07-13 NOTE — Telephone Encounter (Signed)
Pt returning call

## 2022-07-14 ENCOUNTER — Ambulatory Visit (HOSPITAL_BASED_OUTPATIENT_CLINIC_OR_DEPARTMENT_OTHER): Payer: Commercial Managed Care - HMO | Admitting: Family

## 2022-07-14 ENCOUNTER — Encounter (HOSPITAL_BASED_OUTPATIENT_CLINIC_OR_DEPARTMENT_OTHER): Payer: Self-pay | Admitting: Family

## 2022-07-14 VITALS — BP 106/70 | HR 61 | Ht 64.0 in | Wt 149.0 lb

## 2022-07-14 DIAGNOSIS — E785 Hyperlipidemia, unspecified: Secondary | ICD-10-CM | POA: Diagnosis not present

## 2022-07-14 DIAGNOSIS — I2510 Atherosclerotic heart disease of native coronary artery without angina pectoris: Secondary | ICD-10-CM

## 2022-07-14 DIAGNOSIS — R5383 Other fatigue: Secondary | ICD-10-CM | POA: Diagnosis not present

## 2022-07-14 DIAGNOSIS — E7801 Familial hypercholesterolemia: Secondary | ICD-10-CM | POA: Diagnosis not present

## 2022-07-14 DIAGNOSIS — Z9861 Coronary angioplasty status: Secondary | ICD-10-CM

## 2022-07-14 NOTE — Patient Instructions (Signed)
Medication Instructions:  Your physician recommends that you continue on your current medications as directed. Please refer to the Current Medication list given to you today.  *If you need a refill on your cardiac medications before your next appointment, please call your pharmacy*   Lab Work: Your physician recommends that you return for lab work today- Fasting Lipid Panel, Cmp, CBC, and Thyroid panel  If you have labs (blood work) drawn today and your tests are completely normal, you will receive your results only by: MyChart Message (if you have MyChart) OR A paper copy in the mail If you have any lab test that is abnormal or we need to change your treatment, we will call you to review the results.   Testing/Procedures: How to Prepare for Your Cardiac PET/CT Stress Test:  1. Please do not take these medications before your test:   Medications that may interfere with the cardiac pharmacological stress agent (ex. nitrates - including erectile dysfunction medications, isosorbide mononitrate, tamulosin or beta-blockers) the day of the exam. (Erectile dysfunction medication should be held for at least 72 hrs prior to test) Theophylline containing medications for 12 hours. Dipyridamole 48 hours prior to the test. Your remaining medications may be taken with water.  2. Nothing to eat or drink, except water, 3 hours prior to arrival time.   NO caffeine/decaffeinated products, or chocolate 12 hours prior to arrival.  3. NO perfume, cologne or lotion  4. Total time is 1 to 2 hours; you may want to bring reading material for the waiting time.  5. Please report to Radiology at the [facility] Main Entrance 30 minutes early for your test.  [address] [place], [place] [address]  Diabetic Preparation:  Hold oral medications. You may take NPH and Lantus insulin. Do not take Humalog or Humulin R (Regular Insulin) the day of your test. Check blood sugars prior to leaving the  house. If able to eat breakfast prior to 3 hour fasting, you may take all medications, including your insulin, Do not worry if you miss your breakfast dose of insulin - start at your next meal.  IF YOU THINK YOU MAY BE PREGNANT, OR ARE NURSING PLEASE INFORM THE TECHNOLOGIST.  In preparation for your appointment, medication and supplies will be purchased.  Appointment availability is limited, so if you need to cancel or reschedule, please call the Radiology Department at [phone]  24 hours in advance to avoid a cancellation fee of $100.00  What to Expect After you Arrive:  Once you arrive and check in for your appointment, you will be taken to a preparation room within the Radiology Department.  A technologist or Nurse will obtain your medical history, verify that you are correctly prepped for the exam, and explain the procedure.  Afterwards,  an IV will be started in your arm and electrodes will be placed on your skin for EKG monitoring during the stress portion of the exam. Then you will be escorted to the PET/CT scanner.  There, staff will get you positioned on the scanner and obtain a blood pressure and EKG.  During the exam, you will continue to be connected to the EKG and blood pressure machines.  A small, safe amount of a radioactive tracer will be injected in your IV to obtain a series of pictures of your heart along with an injection of a stress agent.    After your Exam:  It is recommended that you eat a meal and drink a caffeinated  beverage to counter act any effects of the stress agent.  Drink plenty of fluids for the remainder of the day and urinate frequently for the first couple of hours after the exam.  Your doctor will inform you of your test results within 7-10 business days.  For questions about your test or how to prepare for your test, please call: Rockwell Alexandria, Cardiac Imaging Nurse Navigator  Larey Brick, Cardiac Imaging Nurse Navigator Office:  [phone]   Follow-Up: At [facility], you and your health needs are our priority.  As part of our continuing mission to provide you with exceptional heart care, we have created designated Provider Care Teams.  These Care Teams include your primary Cardiologist (physician) and Advanced Practice Providers (APPs -  Physician Assistants and Nurse Practitioners) who all work together to provide you with the care you need, when you need it.  We recommend signing up for the patient portal called "MyChart".  Sign up information is provided on this After Visit Summary.  MyChart is used to connect with patients for Virtual Visits (Telemedicine).  Patients are able to view lab/test results, encounter notes, upcoming appointments, etc.  Non-urgent messages can be sent to your provider as well.   To learn more about what you can do with MyChart, go to [email or web address].    Your next appointment:   2-3 month(s)  Provider:   Lennie Odor, MD or Gillian Shields, NP

## 2022-07-14 NOTE — Progress Notes (Addendum)
Office Visit    Patient Name: Wanda Wood Date of Encounter: 07/16/2022  PCP:  Mort Sawyers, FNP   [facility]  Cardiologist:  Reatha Harps, MD / Dr. Rennis Golden (lipids) Advanced Practice Provider:  No care team member to display Electrophysiologist:  None      Chief Complaint    Wanda Wood is a 52 y.o. 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   Anxiety 1998   Spoke with ob/gyn. Prescription for Xanax for short period.   Arthritis 2005   Chiropractor mentioned inflammation throughout my spine   Depression 1987   Depressed in high school   High cholesterol    Myocardial infarction [facility])    Preventative health care 04/29/2021   Systolic CHF, acute [facility])    Past Surgical History:  Procedure Laterality Date   CERVICAL SPINE SURGERY     CORONARY STENT INTERVENTION N/A 01/20/2021   Procedure: CORONARY STENT INTERVENTION;  Surgeon: Runell Gess, MD;  Location: [facility] INVASIVE CV LAB;  Service: Cardiovascular;  Laterality: N/A;   LEFT HEART CATH AND CORONARY ANGIOGRAPHY N/A 01/20/2021   Procedure: LEFT HEART CATH AND CORONARY ANGIOGRAPHY;  Surgeon: Runell Gess, MD;  Location: [facility] INVASIVE CV LAB;  Service: Cardiovascular;  Laterality: N/A;   SPINE SURGERY  11/06/2010   Spinal fusion C5-C7    Allergies  Allergies  Allergen Reactions   Atorvastatin Diarrhea   Lactose Intolerance (Gi) Other (See Comments)    GI upset, SOB   Soy Allergy Other (See Comments)    GI upset   Sulfa Antibiotics Nausea And Vomiting   Sulfites     Specifically mentioned as an allergy to an  ingredient in inhalers.   Asa [Aspirin]     Joint stiffness and swelling    History of Present Illness    Wanda Wood is a 52 y.o. female with a hx of familial hyperlipidemia, coronary artery disease (NSTEMI 12/22 with PCI-pLAD), ischemic  cardiomyopathy with recovered LVEF (01/28/2021 LVEF 35-40%, repeat echo 04/2021 LVEF 55-60%) last seen 04/06/22 by Dr. Flora Lipps.  Noted prior intolerance to aspirin, clopidogrel, Brilinta.  Tolerates Effient without issue.  Also prior intolerance to Zetia.  She was on Repatha previously but had issues with high co-pay.    Last week enjoyed a trip to [facility]. Had progressive left sided neck pain as well as left sided headache. Does have prior C5-7 fusion. [place] and [place]. However, the next morning her left neck and headacher were more painful. Friday morning woke up and felt profoundly fatigued. She did take a nitroglycerin and her symptoms suddenly resolved. Since that time has felt tired.  Does not not having as much routine of an exercise regimen over the past few months.  She had some bilateral leg swelling and took her as needed Lasix while on her trip with improvement in symptoms.  Continues tolerate Effient without issue.  She is worried her decreased energy level may be related to thyroid issue as has family history of thyroid dysfunction.  Note she has had interruption in her cholesterol medication and has been taking rosuvastatin intermittently.  EKGs/Labs/Other Studies Reviewed:   The following studies were reviewed today: Cardiac Studies & Procedures   CARDIAC CATHETERIZATION  CARDIAC CATHETERIZATION 01/20/2021  Narrative Images from the original  result were not included.    Mid LAD lesion is 99% stenosed.   A drug-eluting stent was successfully placed using a STENT ONYX FRONTIER 2.25X15.   Post intervention, there is a 0% residual stenosis.   There is severe left ventricular systolic dysfunction.   LV end diastolic pressure is mildly elevated.   The left ventricular ejection fraction is 25-35% by visual estimate.  Wanda Wood is a 52 y.o. female   [id number] LOCATION:  FACILITY: [facility] PHYSICIAN: [name],  M.D. Nov 27, 1970   DATE OF PROCEDURE:  01/20/2021  DATE OF DISCHARGE:     CARDIAC CATHETERIZATION / PCI DES LAD    History obtained from chart review.Wanda Wood is a 52 y.o. 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  Her troponins peaked at about thousand.  She had diffuse anterolateral T wave inversion.  She presents now for diagnostic coronary angiography.   PROCEDURE DESCRIPTION:  The patient was brought to the second floor [place] Cardiac cath lab in the postabsorptive state.  She was premedicated with IV Versed and fentanyl.  Her right wrist and groin Were prepped and shaved in usual sterile fashion. Xylocaine 1% was used for local anesthesia.  I attempted to access the right radial artery under ultrasound guidance unsuccessfully and therefore switched to the femoral approach.  A 5 French sheath was inserted into the right common femoral artery using standard Seldinger technique.  5 French right left second sinus of catheters on the 5 French pigtail catheter used for selective coronary angiography, subselective left internal mammary artery angiography and left ventriculography.  Isovue dye is used for the entirety of the case (180 cc total to patient).  Retrograde aortic, left ventricular and pullback pressures were recorded.  The culprit vessel was the proximal/mid LAD.  The patient received 180 mg of Brilinta p.o. followed by 7500 units of heparin with an ACT of 293.  Isovue dye is used for the entirety of the intervention.  Retroaortic pressures monitored during the case.  Using a 6 [place] XB LAD 3.0 cm guide catheter along with a 0.14 [place] guidewire and a 2 mm x 12 balloon the LAD was crossed with little difficulty and the lesion was predilated.  I then carefully placed a 2.25 x 15 mm long Medtronic frontier drug-eluting stent across the lesion and crossing a small diagonal branch deployed at 14 atm.  The  patient did develop chest pain on initial balloon dilatation.  She received 200 mcg of intracoronary nitroglycerin because of "slow flow".  There was also some "visible thrombus" which was intraluminal just beyond the lesion.  I postdilated the stent with a 2.5 x 12 mm long noncompliant balloon at 14 atm (2.6 mm) resulting reduction of a 99% ulcerated/thrombotic proximal to mid LAD lesion to 0% residual TIMI-3 flow.  The patient tolerated the procedure well.  Her chest pain improved at the end of the case.  The guidewire and catheter were removed.  The sheath was sewn securely in place.  Impression Wanda Wood was admitted with chest pain and a non-STEMI with troponins in the thousand range.  She had diffuse anterolateral T wave inversion.  The "culprit lesion" was a proximal to mid ulcerated 99% LAD lesion.  She also had a high diagonal branch lesion in the 75% range but this was a small vessel.  I placed a 2.25 x 15 mm long Medtronic frontier drug-eluting stent postdilated 2.6 mm.  Her EF was in the 25% range  with anterolateral and/anteroapical wall motion abnormality.  I suspect some of this is "stunned".  She will need DAPT uninterrupted for at least 12 months, guideline directed optimal medical therapy for her LV dysfunction.  Because this was an LAD lesion she may benefit from placement of a LifeVest with reassessment of LV function in 3 months.  She left the lab in stable condition.  [name]. MD, [facility] 01/20/2021 11:48 AM  Findings Coronary Findings Diagnostic  Dominance: Right  Left Anterior Descending Mid LAD lesion is 99% stenosed. Vessel is the culprit lesion. The lesion is thrombotic. The lesion was not previously treated .  Intervention  Mid LAD lesion Stent Lesion crossed with guidewire. Pre-stent angioplasty was performed. A drug-eluting stent was successfully placed using a STENT ONYX FRONTIER 2.25X15. Stent strut is well apposed. Stent does not overlap previously placed  stentPost-stent angioplasty was performed. Post-Intervention Lesion Assessment The intervention was successful. Pre-interventional TIMI flow is 2. Post-intervention TIMI flow is 3. No complications occurred at this lesion. There is a 0% residual stenosis post intervention.   STRESS TESTS  MYOCARDIAL PERFUSION IMAGING 07/17/2021  Narrative   The study is normal. The study is low risk.   Subt;e horizontal ST depression (II and aVF) was noted during stress.   LV perfusion is normal.   Left ventricular function is normal. Nuclear stress EF: 59 %. The left ventricular ejection fraction is normal (55-65%). End diastolic cavity size is normal.   Prior study not available for comparison.   ECHOCARDIOGRAM  ECHOCARDIOGRAM COMPLETE 04/09/2021  Narrative ECHOCARDIOGRAM REPORT    Patient Name:   Wanda Wood Date of Exam: 04/09/2021 Medical Rec #:  [id number]       Height:       64.5 in Accession #:    [id number]      Weight:       145.6 lb Date of Birth:  1970-02-22        BSA:          1.719 m Patient Age:    51 years        BP:           103/64 mmHg Patient Gender: F               HR:           60 bpm. Exam Location:  [address]  Procedure: 2D Echo, 3D Echo, Cardiac Doppler and Color Doppler  Indications:    I50.22 CHF  History:        Patient has prior history of Echocardiogram examinations, most recent 01/21/2021. Ischemic Cardiomyopathy; NSTEMI and CAD.  Sonographer:    Clearence Ped RCS Referring Phys: [id number] Ronnald Ramp O'NEAL  IMPRESSIONS   1. Left ventricular ejection fraction, by estimation, is 55 to 60%. Left ventricular ejection fraction by 3D volume is 59 %. The left ventricle has normal function. The left ventricle has no regional wall motion abnormalities. Left ventricular diastolic parameters were normal. 2. Right ventricular systolic function is normal. The right ventricular size is normal. There is normal pulmonary artery systolic pressure. The estimated right  ventricular systolic pressure is 17.7 mmHg. 3. The mitral valve is normal in structure. Trivial mitral valve regurgitation. No evidence of mitral stenosis. 4. The aortic valve is tricuspid. Aortic valve regurgitation is mild. Aortic valve sclerosis is present, with no evidence of aortic valve stenosis. Aortic regurgitation PHT measures 460 msec. 5. The inferior vena cava is normal in size with greater than 50% respiratory variability, suggesting  right atrial pressure of 3 mmHg. 6. Compared to study dated 01/21/2021, LVF has normalized.  FINDINGS Left Ventricle: Left ventricular ejection fraction, by estimation, is 55 to 60%. Left ventricular ejection fraction by 3D volume is 59 %. The left ventricle has normal function. The left ventricle has no regional wall motion abnormalities. The left ventricular internal cavity size was normal in size. There is no left ventricular hypertrophy. Left ventricular diastolic parameters were normal. Normal left ventricular filling pressure.  Right Ventricle: The right ventricular size is normal. No increase in right ventricular wall thickness. Right ventricular systolic function is normal. There is normal pulmonary artery systolic pressure. The tricuspid regurgitant velocity is 1.92 m/s, and with an assumed right atrial pressure of 3 mmHg, the estimated right ventricular systolic pressure is 17.7 mmHg.  Left Atrium: Left atrial size was normal in size.  Right Atrium: Right atrial size was normal in size.  Pericardium: There is no evidence of pericardial effusion.  Mitral Valve: The mitral valve is normal in structure. Trivial mitral valve regurgitation. No evidence of mitral valve stenosis.  Tricuspid Valve: The tricuspid valve is normal in structure. Tricuspid valve regurgitation is trivial. No evidence of tricuspid stenosis.  Aortic Valve: The aortic valve is tricuspid. Aortic valve regurgitation is mild. Aortic regurgitation PHT measures 460 msec. Aortic  valve sclerosis is present, with no evidence of aortic valve stenosis.  Pulmonic Valve: The pulmonic valve was normal in structure. Pulmonic valve regurgitation is not visualized. No evidence of pulmonic stenosis.  Aorta: The aortic root is normal in size and structure.  Venous: The inferior vena cava is normal in size with greater than 50% respiratory variability, suggesting right atrial pressure of 3 mmHg.  IAS/Shunts: No atrial level shunt detected by color flow Doppler.   LEFT VENTRICLE PLAX 2D LVIDd:         4.30 cm         Diastology LVIDs:         2.90 cm         LV e' medial:    12.80 cm/s LV PW:         0.60 cm         LV E/e' medial:  5.3 LV IVS:        0.60 cm         LV e' lateral:   14.00 cm/s LVOT diam:     1.90 cm         LV E/e' lateral: 4.9 LV SV:         61 LV SV Index:   36 LVOT Area:     2.84 cm        3D Volume EF LV 3D EF:    Left ventricul ar ejection fraction by 3D volume is 59 %.  3D Volume EF: 3D EF:        59 % LV EDV:       115 ml LV ESV:       47 ml LV SV:        68 ml  RIGHT VENTRICLE RV Basal diam:  2.70 cm RV S prime:     13.30 cm/s TAPSE (M-mode): 1.4 cm RVSP:           17.7 mmHg  LEFT ATRIUM             Index        RIGHT ATRIUM           Index LA diam:  2.60 cm 1.51 cm/m   RA Pressure: 3.00 mmHg LA Vol (A2C):   27.3 ml 15.89 ml/m  RA Area:     9.53 cm LA Vol (A4C):   23.3 ml 13.56 ml/m  RA Volume:   18.70 ml  10.88 ml/m LA Biplane Vol: 26.9 ml 15.65 ml/m AORTIC VALVE LVOT Vmax:   110.00 cm/s LVOT Vmean:  69.200 cm/s LVOT VTI:    0.216 m AI PHT:      460 msec  AORTA Ao Root diam: 2.50 cm Ao Asc diam:  3.40 cm  MITRAL VALVE               TRICUSPID VALVE MV Area (PHT):             TR Peak grad:   14.7 mmHg MV Decel Time:             TR Vmax:        192.00 cm/s MV E velocity: 68.10 cm/s  Estimated RAP:  3.00 mmHg MV A velocity: 49.80 cm/s  RVSP:           17.7 mmHg MV E/A ratio:  1.37 SHUNTS Systemic VTI:   0.22 m Systemic Diam: 1.90 cm  Armanda Magic MD Electronically signed by Armanda Magic MD Signature Date/Time: 04/09/2021/2:54:00 PM    Final    MONITORS  LONG TERM MONITOR (3-14 DAYS) 03/22/2021  Narrative Enrollment 03/12/2021-03/15/2021 (3 days 0 hours). Patient had a min HR of 45 bpm (sinus bradycardia), max HR of 125 bpm (2.3 seconds of supraventricular tachycardia), and avg HR of 64 bpm (normal sinus rhythm). Predominant underlying rhythm was Sinus Rhythm. 1 run of Supraventricular Tachycardia occurred lasting 4 beats (2.3 second duration, 4 beats) with a max rate of 125 bpm (avg 119 bpm). Isolated SVEs were rare (<1.0%), SVE Couplets were rare (<1.0%), and no SVE Triplets were present. Isolated VEs were rare (<1.0%), and no VE Couplets or VE Triplets were present. Diary summarized below:  03/12/21 05:00pm skipped/irregular beat(s), fluttering/racing coincided with normal sinus rhythm 63 bpm. 03/13/21 09:45am pain/tingling (in neck or arm), chest pain/pressure coincided with normal sinus rhythm 66 bpm.  Impression: 1. No significant arrhythmias detected. 2. Symptoms occurred with normal sinus rhythm. 3. Rare ectopy.  Gerri Spore T. Flora Lipps, MD, [facility]  [facility] [address] [place], [place] [address] [phone] 9:47 AM            EKG:  EKG is ordered today.  The ekg ordered today demonstrates NSR 61 bpm  Recent Labs: 07/14/2022: ALT 17; BUN 8; Creatinine, Ser 0.80; Hemoglobin 15.2; Platelets 243; Potassium 5.2; Sodium 141; TSH 1.390  Recent Lipid Panel    Component Value Date/Time   CHOL 284 (H) 07/14/2022 0912   TRIG 49 07/14/2022 0912   HDL 106 07/14/2022 0912   CHOLHDL 2.7 07/14/2022 0912   CHOLHDL 7.4 01/19/2021 0020   VLDL 22 01/19/2021 0020   LDLCALC 171 (H) 07/14/2022 0912   LDLDIRECT 243 (H) 04/04/2021 1542     Home Medications   Current Meds  Medication Sig   acetaminophen (TYLENOL) 500 MG tablet Take 500 mg by mouth every 6 (six)  hours as needed for moderate pain or mild pain.   ALPRAZolam (XANAX) 0.25 MG tablet Take 1 tablet (0.25 mg total) by mouth at bedtime as needed for anxiety.   furosemide (LASIX) 20 MG tablet Take 1 tablet (20 mg total) by mouth daily as needed. For weight gain 3lb overnight, 5lbs in a  week, shortness of breath or leg edema   nitroGLYCERIN (NITROSTAT) 0.4 MG SL tablet Place 1 tablet (0.4 mg total) under the tongue every 5 (five) minutes x 3 doses as needed for chest pain.   prasugrel (EFFIENT) 10 MG TABS tablet Take 1 tablet (10 mg total) by mouth daily.   rosuvastatin (CRESTOR) 40 MG tablet Take 1 tablet by mouth once daily     Review of Systems      All other systems reviewed and are otherwise negative except as noted above.  Physical Exam    VS:  BP 106/70   Pulse 61   Ht 5\' 4"  (1.626 m)   Wt 149 lb (67.6 kg)   BMI 25.58 kg/m  , BMI Body mass index is 25.58 kg/m.  Wt Readings from Last 3 Encounters:  07/14/22 149 lb (67.6 kg)  06/15/22 152 lb 12.8 oz (69.3 kg)  04/06/22 149 lb 3.2 oz (67.7 kg)    GEN: Well nourished, well developed, in no acute distress. HEENT: normal. Neck: Supple, no JVD, carotid bruits, or masses. Cardiac: RRR, no murmurs, rubs, or gallops. No clubbing, cyanosis, edema.  Radials/PT 2+ and equal bilaterally.  Respiratory:  Respirations regular and unlabored, clear to auscultation bilaterally. GI: Soft, nontender, nondistended. MS: No deformity or atrophy. Skin: Warm and dry, no rash. Neuro:  Strength and sensation are intact. Psych: Normal affect.  Assessment & Plan    CAD s/p 01/2021 NSTEMI with DES-pLAD -EKG today no acute ST/T wave changes. 1 week history of left neck, arm pain radiating to jaw which is similar to anginal equivalent.  However some mixed typical and atypical features that she does have some tenderness and prior cervical fusion.  Unable to add amlodipine or Imdur for antianginal benefit due to baseline hypotension.  Previously did not  tolerate metoprolol.  Continue as needed nitroglycerin.  Encouraged heat pack for neck pain as well.  She reports intolerance to NSAIDs.  Plan for cardiac PET to rule out new stenosis given interruption in cholesterol therapy. Unable to utilize CTA due to prior stenting. Unable to exercise to target heart rate due to fatigue, exercise intolerance. Lipid management, as below.   Shared Decision Making/Informed Consent The risks [chest pain, shortness of breath, cardiac arrhythmias, dizziness, blood pressure fluctuations, myocardial infarction, stroke/transient ischemic attack, nausea, vomiting, allergic reaction, radiation exposure, metallic taste sensation and life-threatening complications (estimated to be 1 in 10,000)], benefits (risk stratification, diagnosing coronary artery disease, treatment guidance) and alternatives of a cardiac PET stress test were discussed in detail with Ms. Dome and she agrees to proceed.   Familial hyperlipidemia -04/01/2021 LP(a) 109.2.  Previously on Repatha but co-pay was too high and has since discontinued.  She is taking her rosuvastatin only intermittently as has concerns regarding this medication.  Update lipid panel today. Recommend taking Rosuvastatin 40mg  daily as prescribed.  Fatigue-CBC, thyroid panel today to rule out anemia or thyroid abnormalities contributory.  Further follow-up per primary care if this is unremarkable.    Disposition: Follow up  in 2-3 months  with Reatha Harps, MD or APP.  Signed, Alver Sorrow, NP 07/16/2022, 12:02 PM [facility]

## 2022-07-15 LAB — LIPID PANEL
Chol/HDL Ratio: 2.7 ratio (ref 0.0–4.4)
Cholesterol, Total: 284 mg/dL — ABNORMAL HIGH (ref 100–199)
HDL: 106 mg/dL (ref >39)
LDL Chol Calc (NIH): 171 mg/dL — ABNORMAL HIGH (ref 0–99)
Triglycerides: 49 mg/dL (ref 0–149)
VLDL Cholesterol Cal: 7 mg/dL (ref 5–40)

## 2022-07-15 LAB — COMPREHENSIVE METABOLIC PANEL
ALT: 17 IU/L (ref 0–32)
AST: 23 IU/L (ref 0–40)
Albumin/Globulin Ratio: 1.9 (ref 1.2–2.2)
Albumin: 5 g/dL — ABNORMAL HIGH (ref 3.8–4.9)
Alkaline Phosphatase: 93 IU/L (ref 44–121)
BUN/Creatinine Ratio: 10 (ref 9–23)
BUN: 8 mg/dL (ref 6–24)
Bilirubin Total: 0.4 mg/dL (ref 0.0–1.2)
CO2: 23 mmol/L (ref 20–29)
Calcium: 10.5 mg/dL — ABNORMAL HIGH (ref 8.7–10.2)
Chloride: 102 mmol/L (ref 96–106)
Creatinine, Ser: 0.8 mg/dL (ref 0.57–1.00)
Globulin, Total: 2.6 g/dL (ref 1.5–4.5)
Glucose: 104 mg/dL — ABNORMAL HIGH (ref 70–99)
Potassium: 5.2 mmol/L (ref 3.5–5.2)
Sodium: 141 mmol/L (ref 134–144)
Total Protein: 7.6 g/dL (ref 6.0–8.5)
eGFR: 88 mL/min/{1.73_m2} (ref >59)

## 2022-07-15 LAB — THYROID PANEL WITH TSH
Free Thyroxine Index: 2.2 (ref 1.2–4.9)
T3 Uptake Ratio: 27 % (ref 24–39)
T4, Total: 8.3 ug/dL (ref 4.5–12.0)
TSH: 1.39 u[IU]/mL (ref 0.450–4.500)

## 2022-07-15 LAB — CBC
Hematocrit: 46.6 % (ref 34.0–46.6)
Hemoglobin: 15.2 g/dL (ref 11.1–15.9)
MCH: 29.5 pg (ref 26.6–33.0)
MCHC: 32.6 g/dL (ref 31.5–35.7)
MCV: 90 fL (ref 79–97)
Platelets: 243 10*3/uL (ref 150–450)
RBC: 5.16 x10E6/uL (ref 3.77–5.28)
RDW: 12.2 % (ref 11.7–15.4)
WBC: 5.3 10*3/uL (ref 3.4–10.8)

## 2022-07-16 ENCOUNTER — Encounter (HOSPITAL_BASED_OUTPATIENT_CLINIC_OR_DEPARTMENT_OTHER): Payer: Self-pay | Admitting: Family

## 2022-07-16 ENCOUNTER — Telehealth (HOSPITAL_BASED_OUTPATIENT_CLINIC_OR_DEPARTMENT_OTHER): Payer: Self-pay

## 2022-07-16 MED ORDER — REPATHA SURECLICK 140 MG/ML ~~LOC~~ SOAJ: 140.0000 mg | SUBCUTANEOUS | 6 refills | Status: DC

## 2022-07-16 NOTE — Telephone Encounter (Addendum)
Seen by patient Wanda Wood on 07/16/2022 12:36 PM; follow up mychart message to patient, repatha patient assistance mailed to patient.    ----- Message from Alver Sorrow, NP sent at 07/16/2022 12:28 PM EDT ----- Normal kidneys, liver, thyroid. Calcium mildly elevated, if taking calcium substitute recommend discontinuation. Cholesterol panel very elevated - recommend Crestor 40mg  daily as presently prescribed. Recommend resume Repatha - can initiate patient assistance paperwork. Unfortunately [facility] is Medicare only so will need to complete assistance paperwork.

## 2022-07-22 DIAGNOSIS — C4491 Basal cell carcinoma of skin, unspecified: Secondary | ICD-10-CM

## 2022-07-22 HISTORY — DX: Basal cell carcinoma of skin, unspecified: C44.91

## 2022-08-10 MED ORDER — REPATHA SURECLICK 140 MG/ML ~~LOC~~ SOAJ: 140.0000 mg | SUBCUTANEOUS | 6 refills | Status: DC

## 2022-08-10 NOTE — Telephone Encounter (Signed)
Patient assistance received in the mail, provider potion filled out for signature. Will fax.

## 2022-08-10 NOTE — Addendum Note (Signed)
Addended by: Marlene Lard on: 08/10/2022 03:58 PM   Modules accepted: Orders

## 2022-08-17 ENCOUNTER — Telehealth (HOSPITAL_BASED_OUTPATIENT_CLINIC_OR_DEPARTMENT_OTHER): Payer: Self-pay

## 2022-08-17 ENCOUNTER — Other Ambulatory Visit (HOSPITAL_COMMUNITY): Payer: Self-pay

## 2022-08-17 NOTE — Telephone Encounter (Signed)
Pharmacy Patient Advocate Encounter   Received notification from CoverMyMeds that prior authorization for Repatha SureClick 140mg /ml auto injector is required/requested.   Insurance verification completed.   The patient is insured through [employer] .    PA submitted to [employer] via CoverMyMeds Key/confirmation #/EOC [id number] Status is pending

## 2022-08-24 NOTE — Telephone Encounter (Signed)
Pharmacy Patient Advocate Encounter  Received notification from [employer] that Prior Authorization for REPATHA has been APPROVED from 7.8.24 to 7.8.25.Wanda Wood

## 2022-08-26 IMAGING — DX DG CERVICAL SPINE COMPLETE 4+V
5 series · 5 of 5 positions shown · non-contrast
Comparison: None Available.

CLINICAL DATA: Neck injury with neck pain.

EXAM:
CERVICAL SPINE - COMPLETE 4+ VIEW

[cervical spine ap]
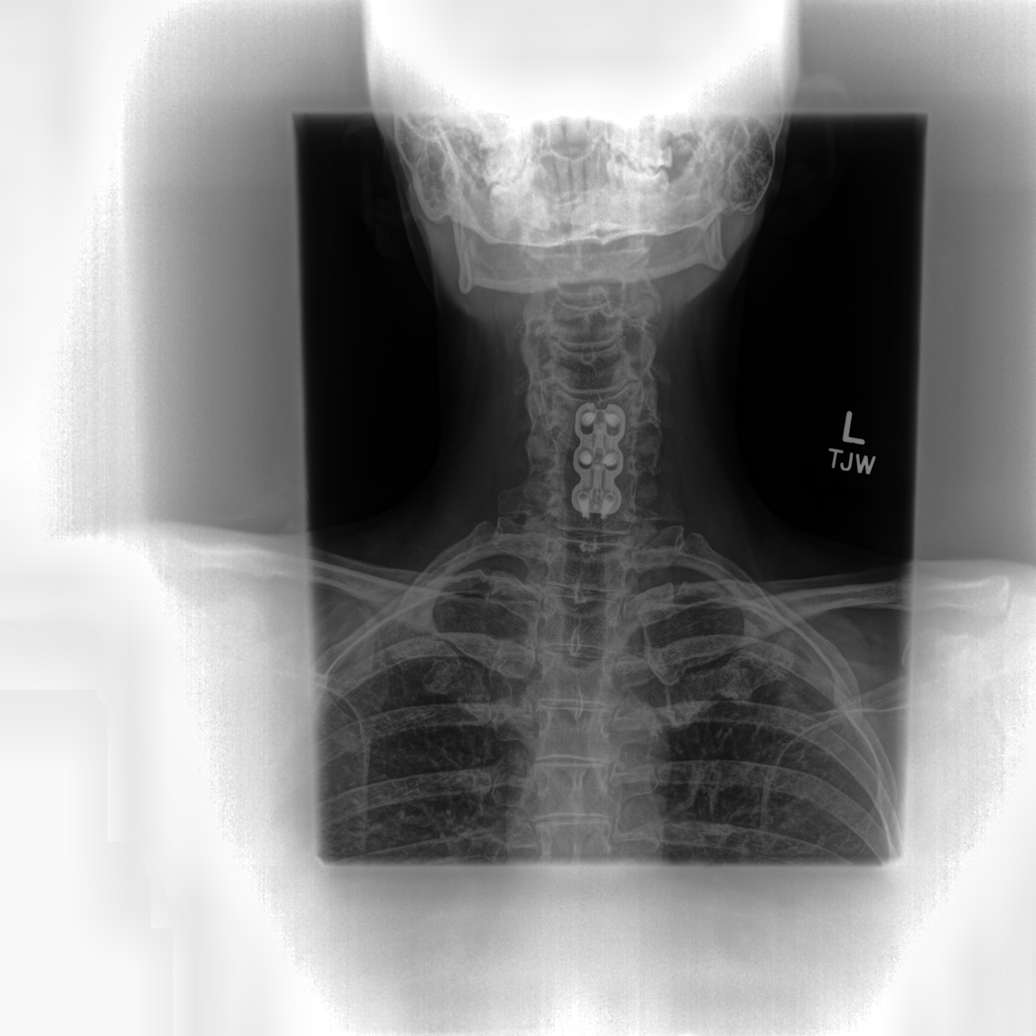

[cervical spine ap open mouth]
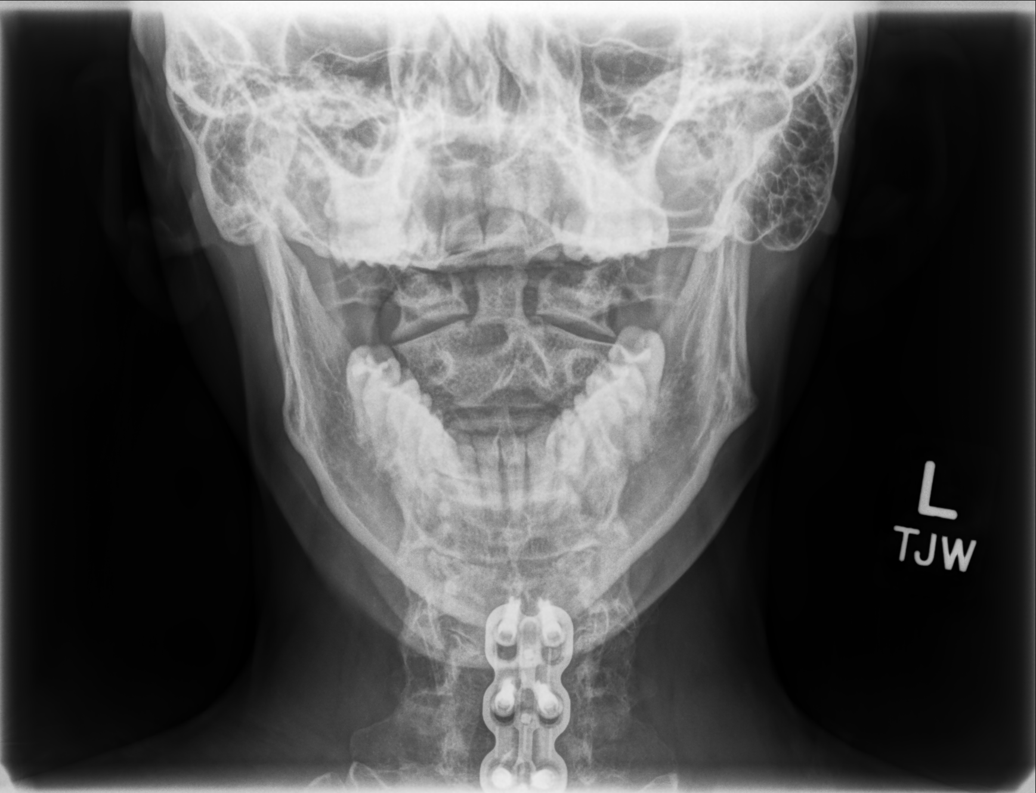

[cervical spine (swimmers) lat]
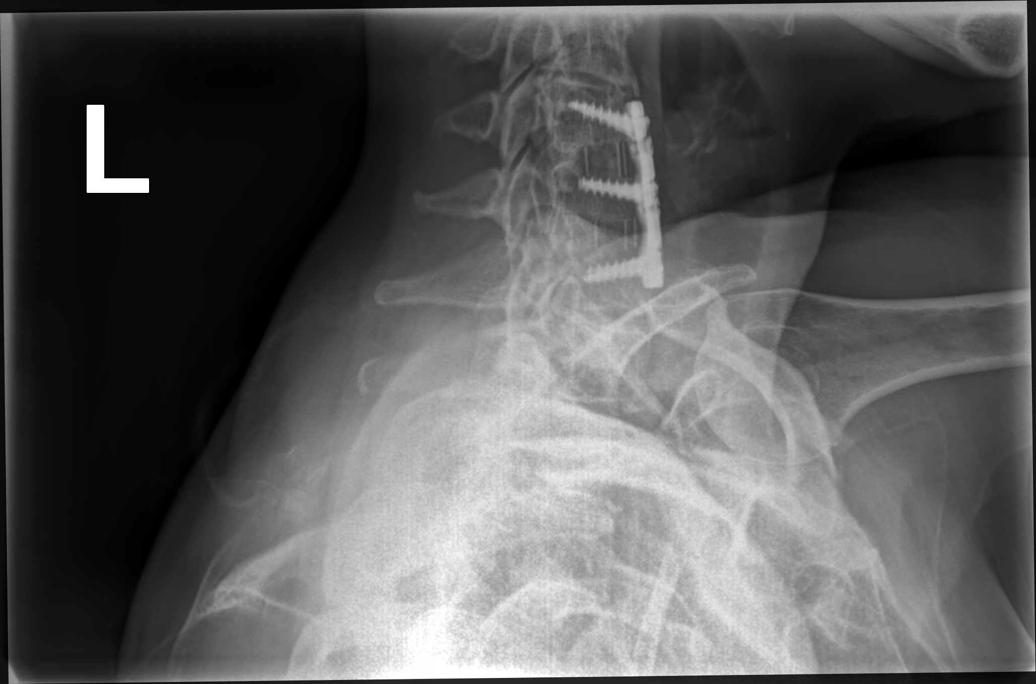

[cervical spine lat]
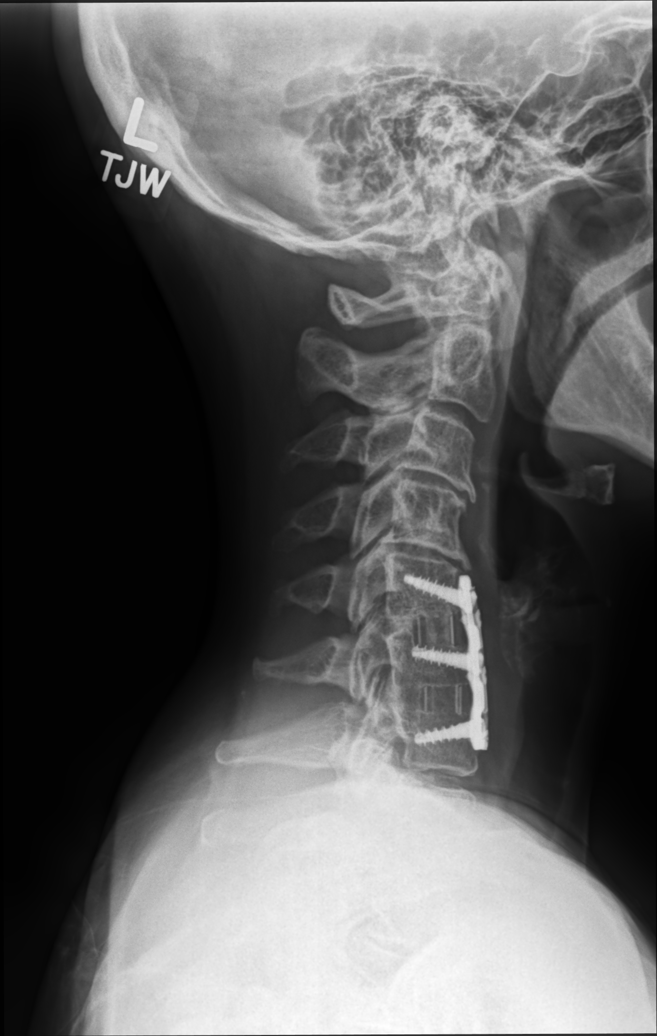

[cervical spine oblique lmo]
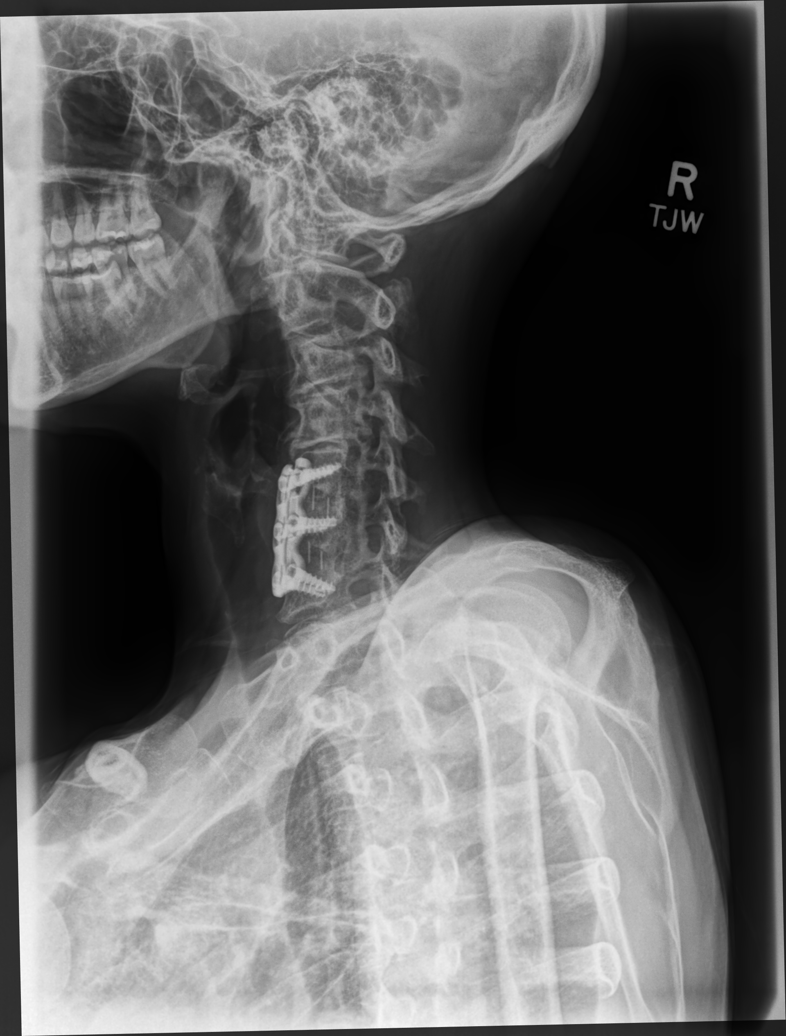

[5 of 5 positions shown; findings below may reference images not displayed]

FINDINGS: There is no evidence of cervical spine fracture or prevertebral soft
tissue swelling. There is mild lordosis of the cervical spine.
Patient is status post prior anterior fusion of C5 through C7
without malalignment. Narrowed joint space and anterior
osteophytosis are identified at C3-4 and C4-5. Narrow bilateral
neural foramina with osteophyte encroachment are identified at C3-4
and C4-5.
IMPRESSION: No acute fracture or dislocation. Degenerative joint changes as
described.

## 2022-09-02 ENCOUNTER — Other Ambulatory Visit: Payer: Self-pay | Admitting: Obstetrics and Gynecology

## 2022-09-02 DIAGNOSIS — E2839 Other primary ovarian failure: Secondary | ICD-10-CM

## 2022-09-04 LAB — LAB REPORT - SCANNED: A1c: 5.4

## 2022-09-08 NOTE — Telephone Encounter (Signed)
Followed up on patient assistance, patient does not qualify due to commerical insurance. Consulted with Lipid Team, Eileen Stanford thinks she may be able to get her to qualified for Leqvio.

## 2022-09-11 NOTE — Telephone Encounter (Signed)
She doesn't qualify for patient assistance because she has Nurse, learning disability. She should just be using the $5 copay card for Repatha.

## 2022-09-11 NOTE — Telephone Encounter (Signed)
Left message for patient to call back  

## 2022-09-11 NOTE — Telephone Encounter (Signed)
Repatha patient assistance denied. Thoughts on alternatives?  Thanks,  Alver Sorrow, NP

## 2022-09-11 NOTE — Telephone Encounter (Signed)
Can we call her to make sure she has the repatha copay card? We can also mai lher one.   Alver Sorrow, NP

## 2022-09-14 ENCOUNTER — Encounter: Payer: Self-pay | Admitting: Cardiovascular Disease

## 2022-09-14 NOTE — Telephone Encounter (Signed)
Patient returned call, transferred from call center. Patient has not yet gotten a copay card, informed her we will mail it to her.

## 2022-09-14 NOTE — Telephone Encounter (Signed)
Error

## 2022-09-14 NOTE — Telephone Encounter (Signed)
2nd call attempt, no answer, left message to call back!  

## 2022-09-14 NOTE — Telephone Encounter (Signed)
Patient returned RN's call. 

## 2022-09-14 NOTE — Telephone Encounter (Signed)
Returned call to patient and updated her, she will go online and submit for copay card.

## 2022-09-21 NOTE — Progress Notes (Unsigned)
Cardiology Office Note:   Date:  09/23/2022  NAME:  Wanda Wood    MRN: [id number] DOB:  01/13/1971   PCP:  Mort Sawyers, FNP  Cardiologist:  Reatha Harps, MD  Electrophysiologist:  None   Referring MD: Mort Sawyers, FNP   Chief Complaint  Patient presents with   Follow-up    History of Present Illness:   Wanda Wood is a 52 y.o. female with a hx of CAD, hyperlipidemia, ischemic cardiomyopathy with recovery of ejection fraction who presents for follow-up.  She reports she cannot afford Repatha.  Most recent LDL 451.  Cannot tolerate Crestor.  Describes cramping.  She is willing to try Lipitor.  She has been tolerant of most medications.  We are going to try to get her back on Repatha.  She reports no chest pains or trouble breathing.  She does describe low energy.  She is considering hormone replacement therapy.  No further chest discomfort.  Does not need a stress test.  Actually had a basal cell cancer removed from her left neck.  She describes some pain with that that improved with nitroglycerin.  She has had no further episodes.  I suspect this is noncardiac.  CV exam unremarkable.  Problem List CAD/NSTEMI -01/20/2021 -PCI to pLAD 2. Ischemic CM with recovery of EF -EF 35-40% -EF 55-60% 04/09/2021 3. 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   Anxiety 1998   Spoke with ob/gyn. Prescription for Xanax for short period.   Arthritis 2005   Chiropractor mentioned inflammation throughout my spine   Depression 1987   Depressed in high school   High cholesterol    Myocardial infarction [facility])    Preventative health care 04/29/2021   Systolic CHF, acute [facility])     Past Surgical History: Past Surgical History:  Procedure Laterality Date   CERVICAL SPINE SURGERY     CORONARY STENT INTERVENTION N/A 01/20/2021   Procedure: CORONARY STENT  INTERVENTION;  Surgeon: Runell Gess, MD;  Location: [facility] INVASIVE CV LAB;  Service: Cardiovascular;  Laterality: N/A;   LEFT HEART CATH AND CORONARY ANGIOGRAPHY N/A 01/20/2021   Procedure: LEFT HEART CATH AND CORONARY ANGIOGRAPHY;  Surgeon: Runell Gess, MD;  Location: [facility] INVASIVE CV LAB;  Service: Cardiovascular;  Laterality: N/A;   SPINE SURGERY  11/06/2010   Spinal fusion C5-C7    Current Medications: Current Meds  Medication Sig   acetaminophen (TYLENOL) 500 MG tablet Take 500 mg by mouth every 6 (six) hours as needed for moderate pain or mild pain.   ALPRAZolam (XANAX) 0.25 MG tablet Take 1 tablet (0.25 mg total) by mouth at bedtime as needed for anxiety.   aspirin 81 MG chewable tablet Chew 81 mg by mouth daily.   atorvastatin (LIPITOR) 40 MG tablet Take 1 tablet (40 mg total) by mouth daily.   furosemide (LASIX) 20 MG tablet Take 1 tablet (20 mg total) by mouth daily as needed. For weight gain 3lb overnight, 5lbs in a week, shortness of breath or leg edema   nitroGLYCERIN (NITROSTAT) 0.4 MG SL tablet Place 1 tablet (0.4 mg total) under the tongue every 5 (five) minutes x 3 doses as needed for chest pain.     Allergies:    Atorvastatin, Lactose intolerance (gi), Soy allergy, Sulfa antibiotics, Sulfites, and Asa [aspirin]   Social History: Social History   Socioeconomic History  Marital status: Single    Spouse name: Not on file   Number of children: 0   Years of education: Not on file   Highest education level: Associate degree: academic program  Occupational History   Occupation: bartender/waitress    Comment: part time  Tobacco Use   Smoking status: Former    Current packs/day: 0.50    Average packs/day: 0.5 packs/day for 3.0 years (1.5 ttl pk-yrs)    Types: Cigarettes   Smokeless tobacco: Never  Vaping Use   Vaping status: Never Used  Substance and Sexual Activity   Alcohol use: Not Currently    Comment: no more than 4 glasses of red wine a week   Drug  use: Not Currently    Frequency: 4.0 times per week    Types: Marijuana   Sexual activity: Not Currently    Birth control/protection: Post-menopausal  Other Topics Concern   Not on file  Social History Narrative   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   Social Determinants of Health   Financial Resource Strain: High Risk (06/14/2022)   Overall Financial Resource Strain (CARDIA)    Difficulty of Paying Living Expenses: Very hard  Food Insecurity: No Food Insecurity (06/14/2022)   Hunger Vital Sign    Worried About Running Out of Food in the Last Year: Never true    Ran Out of Food in the Last Year: Never true  Transportation Needs: No Transportation Needs (06/14/2022)   PRAPARE - Administrator, Civil Service (Medical): No    Lack of Transportation (Non-Medical): No  Physical Activity: Unknown (06/14/2022)   Exercise Vital Sign    Days of Exercise per Week: Patient declined    Minutes of Exercise per Session: Not on file  Stress: Stress Concern Present (06/14/2022)   [employer] of Occupational Health - Occupational Stress Questionnaire    Feeling of Stress : To some extent  Social Connections: Moderately Isolated (06/14/2022)   Social Connection and Isolation Panel [NHANES]    Frequency of Communication with Friends and Family: Twice a week    Frequency of Social Gatherings with Friends and Family: Once a week    Attends Religious Services: More than 4 times per year    Active Member of [employer] or Organizations: No    Attends Engineer, structural: Not on file    Marital Status: Never married     Family History: The patient's family history includes Cancer (age of onset: 88) in her maternal grandmother; Heart attack (age of onset: 68) in her father; Heart disease in her father; Thyroid disease in her mother.  ROS:   All other ROS reviewed and negative. Pertinent positives noted in the HPI.     EKGs/Labs/Other Studies Reviewed:    The following studies were personally reviewed by me today:  EKG:  EKG is not ordered today.        Recent Labs: 07/14/2022: ALT 17; BUN 8; Creatinine, Ser 0.80; Hemoglobin 15.2; Platelets 243; Potassium 5.2; Sodium 141; TSH 1.390   Recent Lipid Panel    Component Value Date/Time   CHOL 284 (H) 07/14/2022 0912   TRIG 49 07/14/2022 0912   HDL 106 07/14/2022 0912   CHOLHDL 2.7 07/14/2022 0912   CHOLHDL 7.4 01/19/2021 0020   VLDL 22 01/19/2021 0020   LDLCALC 171 (H) 07/14/2022 0912   LDLDIRECT 243 (H) 04/04/2021 1542    Physical Exam:   VS:  BP 108/62 (  BP Location: Left Arm, Patient Position: Sitting, Cuff Size: Normal)   Pulse (!) 50   Ht 5' 4.5" (1.638 m)   Wt 156 lb 9.6 oz (71 kg)   SpO2 97%   BMI 26.47 kg/m    Wt Readings from Last 3 Encounters:  09/23/22 156 lb 9.6 oz (71 kg)  07/14/22 149 lb (67.6 kg)  06/15/22 152 lb 12.8 oz (69.3 kg)    General: Well nourished, well developed, in no acute distress Head: Atraumatic, normal size  Eyes: PEERLA, EOMI  Neck: Supple, no JVD Endocrine: No thryomegaly Cardiac: Normal S1, S2; RRR; no murmurs, rubs, or gallops Lungs: Clear to auscultation bilaterally, no wheezing, rhonchi or rales  Abd: Soft, nontender, no hepatomegaly  Ext: No edema, pulses 2+ Musculoskeletal: No deformities, BUE and BLE strength normal and equal Skin: Warm and dry, no rashes   Neuro: Alert and oriented to person, place, time, and situation, CNII-XII grossly intact, no focal deficits  Psych: Normal mood and affect   ASSESSMENT:   Sedalia Zingaro is a 52 y.o. female who presents for the following: 1. Coronary artery disease involving native coronary artery of native heart without angina pectoris   2. CAD S/P percutaneous coronary angioplasty   3. Hyperlipidemia LDL goal <70   4. Homozygous familial hypercholesterolemia     PLAN:   1. Coronary artery disease involving native coronary artery of native heart without angina pectoris 2. CAD S/P  percutaneous coronary angioplasty 3. Hyperlipidemia LDL goal <70 4. Homozygous familial hypercholesterolemia -Status post PCI to the proximal LAD.  She has homozygous familial hypercholesterolemia.  LDL 451.  Intolerant of Crestor.  We will try Lipitor.  Has been unable to afford Repatha recently.  We will give her samples and have pharmacy reach out about what her co-pay is.  It is currently not affordable.  I also given her information on the foundation for familial hyperlipidemia.  Possibly there are resources she can use.  She does describe some low energy and fatigue but this is not cardiac related.  Echo last year was normal.  Stress test normal last year.  She describes some neck discomfort after recent skin procedures.  This is likely noncardiac.  Can forego stress testing at this time.  She will continue aspirin.  Blood pressure is well-controlled.  I have encouraged her to get active.  She will see me back in 6 months.   Disposition: Return in about 6 months (around 03/26/2023).  Medication Adjustments/Labs and Tests Ordered: Current medicines are reviewed at length with the patient today.  Concerns regarding medicines are outlined above.  No orders of the defined types were placed in this encounter.  Meds ordered this encounter  Medications   atorvastatin (LIPITOR) 40 MG tablet    Sig: Take 1 tablet (40 mg total) by mouth daily.    Dispense:  90 tablet    Refill:  3   Patient Instructions  Medication Instructions:  Your physician has recommended you make the following change in your medication:  Lipitor 40mg , once daily   *If you need a refill on your cardiac medications before your next appointment, please call your pharmacy*     Follow-Up: At [facility], you and your health needs are our priority.  As part of our continuing mission to provide you with exceptional heart care, we have created designated Provider Care Teams.  These Care Teams include your primary  Cardiologist (physician) and Advanced Practice Providers (APPs -  Physician Assistants and Nurse  Practitioners) who all work together to provide you with the care you need, when you need it.  We recommend signing up for the patient portal called "MyChart".  Sign up information is provided on this After Visit Summary.  MyChart is used to connect with patients for Virtual Visits (Telemedicine).  Patients are able to view lab/test results, encounter notes, upcoming appointments, etc.  Non-urgent messages can be sent to your provider as well.   To learn more about what you can do with MyChart, go to [email or web address].    Your next appointment:   6 month(s)  Provider:   Reatha Harps, MD     Other Instructions Dr. Scharlene Gloss wants you to visit [email or web address]   Time Spent with Patient: I have spent a total of 35 minutes with patient reviewing hospital notes, telemetry, EKGs, labs and examining the patient as well as establishing an assessment and plan that was discussed with the patient.  > 50% of time was spent in direct patient care.  Signed, Lenna Gilford. Flora Lipps, MD, [facility]  [facility]  [address] [place], [place] [address] [phone]  09/23/2022 11:03 AM

## 2022-09-23 ENCOUNTER — Ambulatory Visit: Payer: Commercial Managed Care - HMO | Attending: Cardiovascular Disease | Admitting: Cardiovascular Disease

## 2022-09-23 ENCOUNTER — Other Ambulatory Visit (HOSPITAL_COMMUNITY): Payer: Self-pay

## 2022-09-23 ENCOUNTER — Other Ambulatory Visit: Payer: Self-pay | Admitting: Pharmacist

## 2022-09-23 ENCOUNTER — Encounter: Payer: Self-pay | Admitting: Cardiovascular Disease

## 2022-09-23 ENCOUNTER — Other Ambulatory Visit (HOSPITAL_BASED_OUTPATIENT_CLINIC_OR_DEPARTMENT_OTHER): Payer: Self-pay

## 2022-09-23 VITALS — BP 108/62 | HR 50 | Ht 64.5 in | Wt 156.6 lb

## 2022-09-23 DIAGNOSIS — I2510 Atherosclerotic heart disease of native coronary artery without angina pectoris: Secondary | ICD-10-CM | POA: Diagnosis not present

## 2022-09-23 DIAGNOSIS — E7801 Familial hypercholesterolemia: Secondary | ICD-10-CM

## 2022-09-23 DIAGNOSIS — Z9861 Coronary angioplasty status: Secondary | ICD-10-CM

## 2022-09-23 DIAGNOSIS — E785 Hyperlipidemia, unspecified: Secondary | ICD-10-CM | POA: Diagnosis not present

## 2022-09-23 MED ORDER — REPATHA SURECLICK 140 MG/ML ~~LOC~~ SOAJ
140.0000 mg | SUBCUTANEOUS | 11 refills | Status: DC
Start: 2022-09-23 — End: 2023-10-05
  Filled 2022-09-23: qty 2, 28d supply, fill #0
  Filled 2022-10-16: qty 2, 28d supply, fill #1
  Filled 2022-11-16: qty 2, 28d supply, fill #2
  Filled 2022-12-11: qty 2, 28d supply, fill #3
  Filled 2023-01-15 (×2): qty 2, 28d supply, fill #4
  Filled 2023-02-10 – 2023-03-21 (×2): qty 2, 28d supply, fill #5
  Filled 2023-04-16 – 2023-04-26 (×2): qty 2, 28d supply, fill #6
  Filled 2023-05-19: qty 2, 28d supply, fill #7
  Filled 2023-06-24: qty 2, 28d supply, fill #8
  Filled 2023-08-01: qty 2, 28d supply, fill #9
  Filled 2023-08-29: qty 2, 28d supply, fill #10
  Filled 2023-09-16: qty 2, 28d supply, fill #11

## 2022-09-23 MED ORDER — REPATHA SURECLICK 140 MG/ML ~~LOC~~ SOAJ
140.0000 mg | SUBCUTANEOUS | 11 refills | Status: DC
Start: 2022-09-23 — End: 2022-09-23

## 2022-09-23 MED ORDER — ATORVASTATIN CALCIUM 40 MG PO TABS
40.0000 mg | ORAL_TABLET | Freq: Every day | ORAL | 3 refills | Status: DC
Start: 2022-09-23 — End: 2023-02-25
  Filled 2022-09-23: qty 30, 30d supply, fill #0

## 2022-09-23 NOTE — Patient Instructions (Signed)
Medication Instructions:  Your physician has recommended you make the following change in your medication:  Lipitor 40mg , once daily   *If you need a refill on your cardiac medications before your next appointment, please call your pharmacy*     Follow-Up: At [facility], you and your health needs are our priority.  As part of our continuing mission to provide you with exceptional heart care, we have created designated Provider Care Teams.  These Care Teams include your primary Cardiologist (physician) and Advanced Practice Providers (APPs -  Physician Assistants and Nurse Practitioners) who all work together to provide you with the care you need, when you need it.  We recommend signing up for the patient portal called "MyChart".  Sign up information is provided on this After Visit Summary.  MyChart is used to connect with patients for Virtual Visits (Telemedicine).  Patients are able to view lab/test results, encounter notes, upcoming appointments, etc.  Non-urgent messages can be sent to your provider as well.   To learn more about what you can do with MyChart, go to [email or web address].    Your next appointment:   6 month(s)  Provider:   Reatha Harps, MD     Other Instructions Dr. Scharlene Gloss wants you to visit [email or web address]

## 2022-12-16 ENCOUNTER — Other Ambulatory Visit (HOSPITAL_BASED_OUTPATIENT_CLINIC_OR_DEPARTMENT_OTHER): Payer: Self-pay

## 2023-01-15 ENCOUNTER — Other Ambulatory Visit: Payer: Self-pay | Admitting: Family

## 2023-01-15 ENCOUNTER — Encounter (HOSPITAL_COMMUNITY): Payer: Self-pay

## 2023-01-15 ENCOUNTER — Other Ambulatory Visit (HOSPITAL_COMMUNITY): Payer: Self-pay

## 2023-01-15 ENCOUNTER — Other Ambulatory Visit: Payer: Self-pay

## 2023-01-15 ENCOUNTER — Other Ambulatory Visit (HOSPITAL_BASED_OUTPATIENT_CLINIC_OR_DEPARTMENT_OTHER): Payer: Self-pay

## 2023-01-15 DIAGNOSIS — F411 Generalized anxiety disorder: Secondary | ICD-10-CM

## 2023-01-17 MED ORDER — ALPRAZOLAM 0.25 MG PO TABS
0.2500 mg | ORAL_TABLET | Freq: Every evening | ORAL | 0 refills | Status: DC | PRN
Start: 2023-01-17 — End: 2023-07-18
  Filled 2023-01-17: qty 30, 30d supply, fill #0

## 2023-01-18 ENCOUNTER — Other Ambulatory Visit (HOSPITAL_COMMUNITY): Payer: Self-pay

## 2023-02-12 ENCOUNTER — Other Ambulatory Visit (HOSPITAL_BASED_OUTPATIENT_CLINIC_OR_DEPARTMENT_OTHER): Payer: Self-pay

## 2023-02-15 ENCOUNTER — Other Ambulatory Visit (HOSPITAL_BASED_OUTPATIENT_CLINIC_OR_DEPARTMENT_OTHER): Payer: Self-pay

## 2023-02-24 NOTE — Progress Notes (Signed)
  Cardiology Office Note:  .   Date:  02/25/2023  ID:  Wanda Wood, DOB 1970-02-11, MRN [id number] PCP: Wanda Sawyers, FNP  [facility] Providers Cardiologist:  Reatha Harps, MD { History of Present Illness: .   Wanda Wood is a 53 y.o. female with history of CAD, HLD, CHF who presents for follow-up.    History of Present Illness   The patient, a 53 year old with a history of CAD, PCI to proximal LAD, ischemic cardiomyopathy with recovered ejection fraction, and familial hyperlipidemia, presents for a follow-up visit. She reports a recent change in his lipid-lowering therapy due to side effects and cost. She has been on Repatha, but had to stop due to the cost ($500 with insurance). She has since switched insurance and has been able to resume Repatha, but is waiting for a refill. She reports feeling unwell for about two weeks after each Repatha injection, but then starts to feel better. She has also started taking niacin, which he reports makes her feel better and more relaxed. She has not had his cholesterol checked since June, when his LDL was 171. She has been less active recently and has been consuming more sugar. She denies any chest pain.          Problem List CAD/NSTEMI -01/20/2021 -PCI to pLAD 2. Ischemic CM with recovery of EF -EF 35-40% -EF 55-60% 04/09/2021 3. Familial Hyperlipidemia -T chol 284, HDL 106, LDL 171, TG 49 -zetia intolerant     ROS: All other ROS reviewed and negative. Pertinent positives noted in the HPI.     Studies Reviewed: Marland Kitchen       Physical Exam:   VS:  BP 100/62 (BP Location: Left Arm, Patient Position: Sitting, Cuff Size: Normal)   Pulse 68   Ht 5\' 4"  (1.626 m)   Wt 158 lb (71.7 kg)   BMI 27.12 kg/m    Wt Readings from Last 3 Encounters:  02/25/23 158 lb (71.7 kg)  09/23/22 156 lb 9.6 oz (71 kg)  07/14/22 149 lb (67.6 kg)    GEN: Well nourished, well developed in no acute distress NECK: No JVD; No carotid bruits CARDIAC:  RRR, no murmurs, rubs, gallops RESPIRATORY:  Clear to auscultation without rales, wheezing or rhonchi  ABDOMEN: Soft, non-tender, non-distended EXTREMITIES:  No edema; No deformity  ASSESSMENT AND PLAN: .   Assessment and Plan    Coronary Artery Disease (CAD) with prior PCI to proximal LAD and recovered ischemic cardiomyopathy No reported chest pain or trouble breathing. -Continue Aspirin.  Familial Hyperlipidemia LDL elevated at 171 in June 2024. Patient reports inconsistent use of Repatha due to cost and side effects, but is now back on it. Also on Crestor 40mg  daily. -Return for fasting lipids in the next few weeks. -Continue Repatha and Crestor 40mg  daily.  General Health Maintenance Patient reports lifestyle changes including job switch and plans to become more active. -Follow up in one year.              Follow-up: Return in about 1 year (around 02/25/2024).   Signed, Wanda Wood. Wanda Lipps, MD, [facility]  [facility]  [address], [address] [place], [place] [address] [phone]  1:46 PM

## 2023-02-25 ENCOUNTER — Ambulatory Visit
Payer: No Typology Code available for payment source | Attending: Cardiovascular Disease | Admitting: Cardiovascular Disease

## 2023-02-25 ENCOUNTER — Encounter: Payer: Self-pay | Admitting: Cardiovascular Disease

## 2023-02-25 VITALS — BP 100/62 | HR 68 | Ht 64.0 in | Wt 158.0 lb

## 2023-02-25 DIAGNOSIS — I2510 Atherosclerotic heart disease of native coronary artery without angina pectoris: Secondary | ICD-10-CM | POA: Diagnosis not present

## 2023-02-25 DIAGNOSIS — Z9861 Coronary angioplasty status: Secondary | ICD-10-CM

## 2023-02-25 DIAGNOSIS — E785 Hyperlipidemia, unspecified: Secondary | ICD-10-CM | POA: Diagnosis not present

## 2023-02-25 DIAGNOSIS — E7801 Familial hypercholesterolemia: Secondary | ICD-10-CM

## 2023-02-25 DIAGNOSIS — I255 Ischemic cardiomyopathy: Secondary | ICD-10-CM

## 2023-02-25 NOTE — Patient Instructions (Signed)
Medication Instructions:  Your physician recommends that you continue on your current medications as directed. Please refer to the Current Medication list given to you today.  *If you need a refill on your cardiac medications before your next appointment, please call your pharmacy*   Lab Work: FASTING Lipid Panel If you have labs (blood work) drawn today and your tests are completely normal, you will receive your results only by: MyChart Message (if you have MyChart) OR A paper copy in the mail If you have any lab test that is abnormal or we need to change your treatment, we will call you to review the results.    Follow-Up: At [facility], you and your health needs are our priority.  As part of our continuing mission to provide you with exceptional heart care, we have created designated Provider Care Teams.  These Care Teams include your primary Cardiologist (physician) and Advanced Practice Providers (APPs -  Physician Assistants and Nurse Practitioners) who all work together to provide you with the care you need, when you need it.     Your next appointment:   1 year(s)  Provider:   Reatha Harps, MD

## 2023-03-01 ENCOUNTER — Other Ambulatory Visit (HOSPITAL_BASED_OUTPATIENT_CLINIC_OR_DEPARTMENT_OTHER): Payer: Self-pay

## 2023-03-03 ENCOUNTER — Other Ambulatory Visit (HOSPITAL_COMMUNITY): Payer: Self-pay

## 2023-03-03 ENCOUNTER — Telehealth: Payer: Self-pay

## 2023-03-03 ENCOUNTER — Other Ambulatory Visit (HOSPITAL_BASED_OUTPATIENT_CLINIC_OR_DEPARTMENT_OTHER): Payer: Self-pay

## 2023-03-03 NOTE — Telephone Encounter (Addendum)
Pharmacy Patient Advocate Encounter   Received notification from CoverMyMeds that prior authorization for REPATHA is required/requested.   Insurance verification completed.   The patient is insured through [facility] .   Per test claim: PA required; PA submitted to above mentioned insurance via CoverMyMeds Key/confirmation #/EOC [id number] Status is pending

## 2023-03-11 ENCOUNTER — Telehealth: Payer: No Typology Code available for payment source | Admitting: Family Medicine

## 2023-03-11 DIAGNOSIS — R6889 Other general symptoms and signs: Secondary | ICD-10-CM

## 2023-03-11 NOTE — Progress Notes (Signed)
E visit for Flu like symptoms   We are sorry that you are not feeling well.  Here is how we plan to help! Based on what you have shared with me it looks like you may have flu-like symptoms that should be watched but do not seem to indicate anti-viral treatment.   Influenza or "the flu" is   an infection caused by a respiratory virus. The flu virus is highly contagious and persons who did not receive their yearly flu vaccination may "catch" the flu from close contact.  Based upon your symptoms and potential risk factors I recommend that you follow the flu symptoms recommendation that I have listed below.  ANYONE WHO HAS FLU SYMPTOMS SHOULD: Stay home. The flu is highly contagious and going out or to work exposes others! Be sure to drink plenty of fluids. Water is fine as well as fruit juices, sodas and electrolyte beverages. You may want to stay away from caffeine or alcohol. If you are nauseated, try taking small sips of liquids. How do you know if you are getting enough fluid? Your urine should be a pale yellow or almost colorless. Get rest. Taking a steamy shower or using a humidifier may help nasal congestion and ease sore throat pain. Using a saline nasal spray works much the same way. Cough drops, hard candies and sore throat lozenges may ease your cough. Line up a caregiver. Have someone check on you regularly.   GET HELP RIGHT AWAY IF: You cannot keep down liquids or your medications. You become short of breath Your fell like you are going to pass out or loose consciousness. Your symptoms persist after you have completed your treatment plan MAKE SURE YOU  Understand these instructions. Will watch your condition. Will get help right away if you are not doing well or get worse.  Your e-visit answers were reviewed by a board certified advanced clinical practitioner to complete your personal care plan.  Depending on the condition, your plan could have included both over the counter or  prescription medications.  If there is a problem please reply  once you have received a response from your provider.  Your safety is important to [place].  If you have drug allergies check your prescription carefully.    You can use MyChart to ask questions about today's visit, request a non-urgent call back, or ask for a work or school excuse for 24 hours related to this e-Visit. If it has been greater than 24 hours you will need to follow up with your provider, or enter a new e-Visit to address those concerns.  You will get an e-mail in the next two days asking about your experience.  I hope that your e-visit has been valuable and will speed your recovery. Thank you for using e-visits.  I provided 5 minutes of non face-to-face time during this encounter for chart review, medication and order placement, as well as and documentation.

## 2023-03-15 ENCOUNTER — Encounter: Payer: Self-pay | Admitting: Cardiovascular Disease

## 2023-03-16 ENCOUNTER — Other Ambulatory Visit (HOSPITAL_COMMUNITY): Payer: Self-pay

## 2023-03-17 ENCOUNTER — Other Ambulatory Visit (HOSPITAL_COMMUNITY): Payer: Self-pay

## 2023-03-17 NOTE — Telephone Encounter (Signed)
 Pharmacy Patient Advocate Encounter  Received notification from [facility] that Prior Authorization for REPATHA  has been APPROVED from 03/16/23 to 06/13/23. Ran test claim, Copay is $30. This test claim was processed through [facility]- copay amounts may vary at other pharmacies due to pharmacy/plan contracts, or as the patient moves through the different stages of their insurance plan.

## 2023-03-21 ENCOUNTER — Other Ambulatory Visit: Payer: Self-pay | Admitting: Family

## 2023-03-22 ENCOUNTER — Other Ambulatory Visit (HOSPITAL_BASED_OUTPATIENT_CLINIC_OR_DEPARTMENT_OTHER): Payer: Self-pay

## 2023-03-22 MED ORDER — ROSUVASTATIN CALCIUM 40 MG PO TABS
40.0000 mg | ORAL_TABLET | Freq: Every day | ORAL | 0 refills | Status: DC
  Filled 2023-03-22 (×2): qty 30, 30d supply, fill #0

## 2023-03-23 ENCOUNTER — Other Ambulatory Visit (HOSPITAL_BASED_OUTPATIENT_CLINIC_OR_DEPARTMENT_OTHER): Payer: Self-pay

## 2023-03-31 ENCOUNTER — Other Ambulatory Visit (HOSPITAL_BASED_OUTPATIENT_CLINIC_OR_DEPARTMENT_OTHER): Payer: Self-pay

## 2023-04-16 ENCOUNTER — Other Ambulatory Visit (HOSPITAL_BASED_OUTPATIENT_CLINIC_OR_DEPARTMENT_OTHER): Payer: Self-pay

## 2023-04-16 ENCOUNTER — Encounter (HOSPITAL_BASED_OUTPATIENT_CLINIC_OR_DEPARTMENT_OTHER): Payer: Self-pay

## 2023-04-16 ENCOUNTER — Other Ambulatory Visit: Payer: Self-pay | Admitting: Family

## 2023-04-16 ENCOUNTER — Other Ambulatory Visit: Payer: Self-pay

## 2023-04-21 ENCOUNTER — Other Ambulatory Visit: Payer: Self-pay | Admitting: Family

## 2023-04-21 ENCOUNTER — Other Ambulatory Visit (HOSPITAL_BASED_OUTPATIENT_CLINIC_OR_DEPARTMENT_OTHER): Payer: Self-pay

## 2023-04-26 ENCOUNTER — Other Ambulatory Visit (HOSPITAL_BASED_OUTPATIENT_CLINIC_OR_DEPARTMENT_OTHER): Payer: Self-pay

## 2023-04-29 ENCOUNTER — Other Ambulatory Visit: Payer: Self-pay | Admitting: Family

## 2023-05-01 ENCOUNTER — Other Ambulatory Visit (HOSPITAL_BASED_OUTPATIENT_CLINIC_OR_DEPARTMENT_OTHER): Payer: Self-pay

## 2023-05-01 ENCOUNTER — Encounter: Payer: Self-pay | Admitting: Cardiovascular Disease

## 2023-05-03 ENCOUNTER — Other Ambulatory Visit (HOSPITAL_BASED_OUTPATIENT_CLINIC_OR_DEPARTMENT_OTHER): Payer: Self-pay

## 2023-05-03 ENCOUNTER — Telehealth (HOSPITAL_BASED_OUTPATIENT_CLINIC_OR_DEPARTMENT_OTHER): Payer: Self-pay | Admitting: *Deleted

## 2023-05-03 MED ORDER — ROSUVASTATIN CALCIUM 40 MG PO TABS
40.0000 mg | ORAL_TABLET | Freq: Every day | ORAL | 0 refills | Status: DC
  Filled 2023-05-03: qty 90, 90d supply, fill #0

## 2023-05-03 NOTE — Telephone Encounter (Signed)
 Copied from CRM [id number]. Topic: Appointments - Transfer of Care >> May 03, 2023 11:56 AM Almira Coaster wrote: Pt is requesting to transfer FROM: [facility] at [facility]  Pt is requesting to transfer TO: [facility] at [facility] Reason for requested transfer: Would like a new primary care provider It is the responsibility of the team the patient would like to transfer to (Dr. De Peru) to reach out to the patient if for any reason this transfer is not acceptable.

## 2023-05-03 NOTE — Telephone Encounter (Signed)
 Appointment made

## 2023-05-24 ENCOUNTER — Other Ambulatory Visit (HOSPITAL_BASED_OUTPATIENT_CLINIC_OR_DEPARTMENT_OTHER): Payer: Self-pay

## 2023-06-16 ENCOUNTER — Encounter (HOSPITAL_COMMUNITY): Payer: Self-pay

## 2023-06-23 ENCOUNTER — Ambulatory Visit (HOSPITAL_BASED_OUTPATIENT_CLINIC_OR_DEPARTMENT_OTHER): Admitting: Family Medicine

## 2023-06-23 ENCOUNTER — Other Ambulatory Visit (HOSPITAL_BASED_OUTPATIENT_CLINIC_OR_DEPARTMENT_OTHER): Payer: Self-pay

## 2023-06-23 VITALS — BP 118/61 | HR 60 | Ht 64.0 in | Wt 159.2 lb

## 2023-06-23 DIAGNOSIS — K5900 Constipation, unspecified: Secondary | ICD-10-CM

## 2023-06-23 DIAGNOSIS — F411 Generalized anxiety disorder: Secondary | ICD-10-CM | POA: Diagnosis not present

## 2023-06-23 DIAGNOSIS — Z1211 Encounter for screening for malignant neoplasm of colon: Secondary | ICD-10-CM

## 2023-06-23 DIAGNOSIS — Z0000 Encounter for general adult medical examination without abnormal findings: Secondary | ICD-10-CM

## 2023-06-23 MED ORDER — DULOXETINE HCL 30 MG PO CPEP
30.0000 mg | ORAL_CAPSULE | Freq: Every day | ORAL | 3 refills | Status: DC
  Filled 2023-06-23: qty 30, 30d supply, fill #0
  Filled 2023-07-18: qty 30, 30d supply, fill #1
  Filled 2023-08-17: qty 30, 30d supply, fill #2
  Filled 2023-09-16: qty 30, 30d supply, fill #3

## 2023-06-23 NOTE — Assessment & Plan Note (Signed)
 We did review treatment options and patient would be open to beginning daily medication for symptom control.  She has not tolerated SSRI in the past and thus we will try SNRI and assess progress.  We will start with low-dose of duloxetine and arrange for follow-up on this in about 4 to 6 weeks or sooner as needed.  Cautioned on potential side effects.

## 2023-06-23 NOTE — Patient Instructions (Signed)
  Medication Instructions:  Your physician recommends that you continue on your current medications as directed. Please refer to the Current Medication list given to you today. --If you need a refill on any your medications before your next appointment, please call your pharmacy first. If no refills are authorized on file call the office.-- Lab Work: Your physician has recommended that you have lab work today: today If you have labs (blood work) drawn today and your tests are completely normal, you will receive your results via MyChart message OR a phone call from our staff.  Please ensure you check your voicemail in the event that you authorized detailed messages to be left on a delegated number. If you have any lab test that is abnormal or we need to change your treatment, we will call you to review the results.  Referrals/Procedures/Imaging: Referrals placed   Follow-Up: Your next appointment:   Your physician recommends that you schedule a follow-up appointment in: 4-6 week follow up physical and medication review  with Dr. de Peru  You will receive a text message or e-mail with a link to a survey about your care and experience with us  today! We would greatly appreciate your feedback!   Thanks for letting us  be apart of your health journey!!  Primary Care and Sports Medicine   Dr. Court Distance Peru   We encourage you to activate your patient portal called "MyChart".  Sign up information is provided on this After Visit Summary.  MyChart is used to connect with patients for Virtual Visits (Telemedicine).  Patients are able to view lab/test results, encounter notes, upcoming appointments, etc.  Non-urgent messages can be sent to your provider as well. To learn more about what you can do with MyChart, please visit --  [email or web address].

## 2023-06-23 NOTE — Progress Notes (Signed)
 New Patient Office Visit  Subjective   Patient ID: Wanda Wood, female    DOB: 16-Feb-1970  Age: 53 y.o. MRN: [id number]  CC:  Chief Complaint  Patient presents with   New Patient (Initial Visit)    New Patient was seeing [facility] at [facility] patient almost feels "ragey" at times has zanax but doesn't want to take this all the time. Also has had constipation for the last 3 months     HPI Wanda Wood presents to establish care Last PCP - Wanda Wood  Anxiety: primary stressor is home environment as she is living with her mother who is 26. She also reports that she will have surges of "rage" at times. Has been prescribed Xanax  in the past. Helps temporarily. Has not tried therapy recently, did engage about 20+ years ago. She reports being on antidepressant in the past, but this reportedly raised her BP. Does not recall which one. This was about 8 years ago.  Has been having issues with constipation. Present for 3 months. Has noticed change in frequency. Does drink a lot of water. Drinks 1-2 cups of black coffee, sometimes helps. She has tried to adjust her diet. Has utilized suppository as needed, infrequently.  History of MI. Does follow with cardiology. Medications currently include  Did not tolerated beta blocker in the past due to low BP and heart rate.  Patient is originally from [place]. 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 She enjoys exercising.  Outpatient Encounter Medications as of 06/23/2023  Medication Sig   acetaminophen  (TYLENOL ) 500 MG tablet Take 500 mg by mouth every 6 (six) hours as needed for moderate pain or mild pain.   ALPRAZolam  (XANAX ) 0.25 MG tablet Take 1 tablet (0.25 mg total) by mouth at bedtime as needed for anxiety.   aspirin  81 MG chewable tablet Chew 81 mg by mouth daily.   DULoxetine (CYMBALTA) 30 MG capsule Take 1 capsule (30 mg total) by mouth daily.   Evolocumab  (REPATHA  SURECLICK) 140 MG/ML SOAJ Inject 140  mg into the skin every 14 (fourteen) days.   furosemide  (LASIX ) 20 MG tablet Take 1 tablet (20 mg total) by mouth daily as needed. For weight gain 3lb overnight, 5lbs in a week, shortness of breath or leg edema   nitroGLYCERIN  (NITROSTAT ) 0.4 MG SL tablet Place 1 tablet (0.4 mg total) under the tongue every 5 (five) minutes x 3 doses as needed for chest pain.   rosuvastatin  (CRESTOR ) 40 MG tablet Take 1 tablet (40 mg total) by mouth daily. Please call PCP for an appointment.   No facility-administered encounter medications on file as of 06/23/2023.    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   Anxiety 1998   Spoke with ob/gyn. Prescription for Xanax  for short period.   Arthritis 2005   Chiropractor mentioned inflammation throughout my spine   Depression 1987   Depressed in high school   High cholesterol    Myocardial infarction [facility])    Preventative health care 04/29/2021   Systolic CHF, acute [facility])     Past Surgical History:  Procedure Laterality Date   CERVICAL SPINE SURGERY     CORONARY STENT INTERVENTION N/A 01/20/2021   Procedure: CORONARY STENT INTERVENTION;  Surgeon: Avanell Leigh, MD;  Location: [facility] INVASIVE CV LAB;  Service: Cardiovascular;  Laterality: N/A;   LEFT HEART CATH AND CORONARY ANGIOGRAPHY N/A 01/20/2021   Procedure:  LEFT HEART CATH AND CORONARY ANGIOGRAPHY;  Surgeon: Avanell Leigh, MD;  Location: [facility] INVASIVE CV LAB;  Service: Cardiovascular;  Laterality: N/A;   SPINE SURGERY  11/06/2010   Spinal fusion C5-C7    Family History  Problem Relation Age of Onset   Thyroid  disease Mother    Heart disease Father    Heart attack Father 38   Cancer Maternal Grandmother 42       uterine cancer    Social History   Socioeconomic History   Marital status: Single    Spouse name: Not on file   Number of children: 0   Years of education: Not on file   Highest education level: Associate degree:  occupational, Scientist, product/process development, or vocational program  Occupational History   Occupation: bartender/waitress    Comment: part time  Tobacco Use   Smoking status: Former    Current packs/day: 0.50    Average packs/day: 0.5 packs/day for 3.0 years (1.5 ttl pk-yrs)    Types: Cigarettes    Passive exposure: Past   Smokeless tobacco: Never  Vaping Use   Vaping status: Never Used  Substance and Sexual Activity   Alcohol use: Not Currently    Comment: no more than 4 glasses of red wine a week   Drug use: Not Currently    Frequency: 4.0 times per week    Types: Marijuana   Sexual activity: Not Currently    Birth control/protection: Post-menopausal  Other Topics Concern   Not on file  Social History Narrative   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   Social Drivers of Health   Financial Resource Strain: Medium Risk (06/23/2023)   Overall Financial Resource Strain (CARDIA)    Difficulty of Paying Living Expenses: Somewhat hard  Food Insecurity: No Food Insecurity (06/23/2023)   Hunger Vital Sign    Worried About Running Out of Food in the Last Year: Never true    Ran Out of Food in the Last Year: Never true  Transportation Needs: No Transportation Needs (06/23/2023)   PRAPARE - Administrator, Civil Service (Medical): No    Lack of Transportation (Non-Medical): No  Physical Activity: Insufficiently Active (06/23/2023)   Exercise Vital Sign    Days of Exercise per Week: 1 day    Minutes of Exercise per Session: 30 min  Stress: Stress Concern Present (06/23/2023)   [employer] of Occupational Health - Occupational Stress Questionnaire    Feeling of Stress : Rather much  Social Connections: Moderately Isolated (06/23/2023)   Social Connection and Isolation Panel [NHANES]    Frequency of Communication with Friends and Family: More than three times a week    Frequency of Social Gatherings with Friends and Family: Once a week    Attends Religious  Services: More than 4 times per year    Active Member of [employer] or Organizations: No    Attends Engineer, structural: Not on file    Marital Status: Never married  Catering manager Violence: Not on file    Objective   BP 118/61 (BP Location: Right Arm, Patient Position: Sitting, Cuff Size: Normal)   Pulse 60   Ht 5\' 4"  (1.626 m)   Wt 159 lb 3.2 oz (72.2 kg)   SpO2 97%   BMI 27.33 kg/m   Physical Exam  53 year old female in no acute distress Cardiovascular exam with regular rate and rhythm Lungs clear to auscultation bilaterally  Assessment & Plan:  Constipation, unspecified constipation type Assessment & Plan: Discussed general recommendations.  Recommend assessing current fiber intake and adjusting intake to achieve goal of 25 to 35 g of fiber per day.  Discussed ensuring appropriate water intake.  Can also utilize Colace over-the-counter to help with any large, hard, bulky stools.  Additionally can utilize MiraLAX as needed, particularly if not having bowel movement for 3 days.  We will also proceed with referral to GI for further evaluation as well as consideration for colonoscopy as patient has not had prior colon cancer screening.  Orders: -     Ambulatory referral to Gastroenterology  Special screening for malignant neoplasms, colon -     Ambulatory referral to Gastroenterology  GAD (generalized anxiety disorder) Assessment & Plan: We did review treatment options and patient would be open to beginning daily medication for symptom control.  She has not tolerated SSRI in the past and thus we will try SNRI and assess progress.  We will start with low-dose of duloxetine and arrange for follow-up on this in about 4 to 6 weeks or sooner as needed.  Cautioned on potential side effects.  Orders: -     Ambulatory referral to Behavioral Health  Wellness examination -     CBC with Differential/Platelet; Future -     Comprehensive metabolic panel with GFR; Future -      Lipid panel; Future -     TSH Rfx on Abnormal to Free T4; Future -     Hemoglobin A1c; Future  Other orders -     DULoxetine HCl; Take 1 capsule (30 mg total) by mouth daily.  Dispense: 30 capsule; Refill: 3  Return in about 4 weeks (around 07/21/2023) for CPE.    ___________________________________________ Audel Coakley de [place], MD, ABFM, CAQSM Primary Care and Sports Medicine [facility]

## 2023-06-23 NOTE — Assessment & Plan Note (Signed)
 Discussed general recommendations.  Recommend assessing current fiber intake and adjusting intake to achieve goal of 25 to 35 g of fiber per day.  Discussed ensuring appropriate water intake.  Can also utilize Colace over-the-counter to help with any large, hard, bulky stools.  Additionally can utilize MiraLAX as needed, particularly if not having bowel movement for 3 days.  We will also proceed with referral to GI for further evaluation as well as consideration for colonoscopy as patient has not had prior colon cancer screening.

## 2023-06-24 LAB — LIPID PANEL
Chol/HDL Ratio: 1.6 ratio (ref 0.0–4.4)
Cholesterol, Total: 190 mg/dL (ref 100–199)
HDL: 118 mg/dL (ref >39)
LDL Chol Calc (NIH): 63 mg/dL (ref 0–99)
Triglycerides: 43 mg/dL (ref 0–149)
VLDL Cholesterol Cal: 9 mg/dL (ref 5–40)

## 2023-06-24 LAB — CBC WITH DIFFERENTIAL/PLATELET
Basophils Absolute: 0 10*3/uL (ref 0.0–0.2)
Basos: 0 %
EOS (ABSOLUTE): 0.1 10*3/uL (ref 0.0–0.4)
Eos: 1 %
Hematocrit: 41.7 % (ref 34.0–46.6)
Hemoglobin: 13.7 g/dL (ref 11.1–15.9)
Immature Grans (Abs): 0 10*3/uL (ref 0.0–0.1)
Immature Granulocytes: 0 %
Lymphocytes Absolute: 2.8 10*3/uL (ref 0.7–3.1)
Lymphs: 40 %
MCH: 30.6 pg (ref 26.6–33.0)
MCHC: 32.9 g/dL (ref 31.5–35.7)
MCV: 93 fL (ref 79–97)
Monocytes Absolute: 0.5 10*3/uL (ref 0.1–0.9)
Monocytes: 7 %
Neutrophils Absolute: 3.5 10*3/uL (ref 1.4–7.0)
Neutrophils: 52 %
Platelets: 206 10*3/uL (ref 150–450)
RBC: 4.48 x10E6/uL (ref 3.77–5.28)
RDW: 12.8 % (ref 11.7–15.4)
WBC: 6.8 10*3/uL (ref 3.4–10.8)

## 2023-06-24 LAB — COMPREHENSIVE METABOLIC PANEL WITH GFR
ALT: 22 IU/L (ref 0–32)
AST: 20 IU/L (ref 0–40)
Albumin: 4.6 g/dL (ref 3.8–4.9)
Alkaline Phosphatase: 81 IU/L (ref 44–121)
BUN/Creatinine Ratio: 14 (ref 9–23)
BUN: 11 mg/dL (ref 6–24)
Bilirubin Total: 0.5 mg/dL (ref 0.0–1.2)
CO2: 24 mmol/L (ref 20–29)
Calcium: 9.5 mg/dL (ref 8.7–10.2)
Chloride: 103 mmol/L (ref 96–106)
Creatinine, Ser: 0.78 mg/dL (ref 0.57–1.00)
Globulin, Total: 2.6 g/dL (ref 1.5–4.5)
Glucose: 88 mg/dL (ref 70–99)
Potassium: 3.9 mmol/L (ref 3.5–5.2)
Sodium: 142 mmol/L (ref 134–144)
Total Protein: 7.2 g/dL (ref 6.0–8.5)
eGFR: 90 mL/min/{1.73_m2} (ref >59)

## 2023-06-24 LAB — HEMOGLOBIN A1C
Est. average glucose Bld gHb Est-mCnc: 111 mg/dL
Hgb A1c MFr Bld: 5.5 % (ref 4.8–5.6)

## 2023-06-24 LAB — TSH RFX ON ABNORMAL TO FREE T4: TSH: 1.25 u[IU]/mL (ref 0.450–4.500)

## 2023-06-25 ENCOUNTER — Ambulatory Visit (HOSPITAL_BASED_OUTPATIENT_CLINIC_OR_DEPARTMENT_OTHER): Payer: Self-pay | Admitting: Family Medicine

## 2023-06-28 ENCOUNTER — Telehealth: Payer: Self-pay | Admitting: Pharmacy Technician

## 2023-06-28 ENCOUNTER — Other Ambulatory Visit (HOSPITAL_COMMUNITY): Payer: Self-pay

## 2023-06-28 ENCOUNTER — Other Ambulatory Visit (HOSPITAL_BASED_OUTPATIENT_CLINIC_OR_DEPARTMENT_OTHER): Payer: Self-pay

## 2023-06-28 NOTE — Telephone Encounter (Signed)
 Pharmacy Patient Advocate Encounter   Received notification from CoverMyMeds that prior authorization for repatha  is required/requested.   Insurance verification completed.   The patient is insured through [facility] .   Per test claim: PA required; PA submitted to above mentioned insurance via CoverMyMeds Key/confirmation #/EOC [id number] Status is pending

## 2023-06-28 NOTE — Telephone Encounter (Signed)
 Pharmacy Patient Advocate Encounter  Received notification from [employer]rx that Prior Authorization for repatha  has been APPROVED from 06/28/23 to 06/27/24. Ran test claim, Copay is $30..- one month. This test claim was processed through [facility]- copay amounts may vary at other pharmacies due to pharmacy/plan contracts, or as the patient moves through the different stages of their insurance plan.   PA #/Case ID/Reference #: [id number]

## 2023-06-30 ENCOUNTER — Other Ambulatory Visit (HOSPITAL_BASED_OUTPATIENT_CLINIC_OR_DEPARTMENT_OTHER): Payer: Self-pay

## 2023-07-01 ENCOUNTER — Other Ambulatory Visit: Payer: Self-pay

## 2023-07-01 ENCOUNTER — Encounter (HOSPITAL_BASED_OUTPATIENT_CLINIC_OR_DEPARTMENT_OTHER): Payer: Self-pay

## 2023-07-01 ENCOUNTER — Other Ambulatory Visit (HOSPITAL_BASED_OUTPATIENT_CLINIC_OR_DEPARTMENT_OTHER): Payer: Self-pay

## 2023-07-01 ENCOUNTER — Other Ambulatory Visit (HOSPITAL_COMMUNITY): Payer: Self-pay

## 2023-07-01 NOTE — Telephone Encounter (Signed)
 Spoke to patient. Confirmed she does not have government insurance. Spoke to pharmacy to fill her rx now. Advised patient that was a  real text from cmm

## 2023-07-01 NOTE — Telephone Encounter (Signed)
 Patient is following up requesting updates on authorization for Repatha . She mentions that she received a text from CoverMyMeds and also wanted to confirm legitimacy before texting back. Please advise.

## 2023-07-18 ENCOUNTER — Other Ambulatory Visit: Payer: Self-pay | Admitting: Cardiovascular Disease

## 2023-07-18 ENCOUNTER — Other Ambulatory Visit: Payer: Self-pay | Admitting: Family

## 2023-07-18 DIAGNOSIS — F411 Generalized anxiety disorder: Secondary | ICD-10-CM

## 2023-07-19 ENCOUNTER — Other Ambulatory Visit: Payer: Self-pay

## 2023-07-19 ENCOUNTER — Other Ambulatory Visit (HOSPITAL_BASED_OUTPATIENT_CLINIC_OR_DEPARTMENT_OTHER): Payer: Self-pay

## 2023-07-19 MED ORDER — ALPRAZOLAM 0.25 MG PO TABS
0.2500 mg | ORAL_TABLET | Freq: Every evening | ORAL | 0 refills | Status: DC | PRN
  Filled 2023-07-19: qty 30, 30d supply, fill #0

## 2023-07-21 ENCOUNTER — Other Ambulatory Visit (HOSPITAL_BASED_OUTPATIENT_CLINIC_OR_DEPARTMENT_OTHER): Payer: Self-pay

## 2023-07-21 ENCOUNTER — Other Ambulatory Visit: Payer: Self-pay | Admitting: Family Medicine

## 2023-07-21 MED ORDER — ROSUVASTATIN CALCIUM 40 MG PO TABS
40.0000 mg | ORAL_TABLET | Freq: Every day | ORAL | 0 refills | Status: DC
  Filled 2023-07-21: qty 90, 90d supply, fill #0

## 2023-07-22 ENCOUNTER — Other Ambulatory Visit (HOSPITAL_BASED_OUTPATIENT_CLINIC_OR_DEPARTMENT_OTHER): Payer: Self-pay

## 2023-07-22 ENCOUNTER — Emergency Department (HOSPITAL_BASED_OUTPATIENT_CLINIC_OR_DEPARTMENT_OTHER)
Admission: EM | Admit: 2023-07-22 | Discharge: 2023-07-22 | Disposition: A | Discharge status: Home / Self Care | Attending: Emergency Medicine | Admitting: Emergency Medicine

## 2023-07-22 ENCOUNTER — Other Ambulatory Visit: Payer: Self-pay

## 2023-07-22 DIAGNOSIS — R220 Localized swelling, mass and lump, head: Secondary | ICD-10-CM

## 2023-07-22 DIAGNOSIS — Z7982 Long term (current) use of aspirin: Secondary | ICD-10-CM | POA: Diagnosis not present

## 2023-07-22 DIAGNOSIS — K130 Diseases of lips: Secondary | ICD-10-CM | POA: Insufficient documentation

## 2023-07-22 MED ORDER — DEXAMETHASONE SODIUM PHOSPHATE 10 MG/ML IJ SOLN
10.0000 mg | Freq: Once | INTRAMUSCULAR | Status: AC
  Administered 2023-07-22: 10 mg via INTRAMUSCULAR
  Filled 2023-07-22: qty 1

## 2023-07-22 MED ORDER — PREDNISONE 20 MG PO TABS
40.0000 mg | ORAL_TABLET | Freq: Every day | ORAL | 0 refills | Status: DC
  Filled 2023-07-22: qty 6, 3d supply, fill #0

## 2023-07-22 NOTE — ED Provider Notes (Signed)
 [place] EMERGENCY DEPARTMENT AT [facility] Provider Note   CSN: [id number] Arrival date & time: 07/22/23  1301     Patient presents with: Rash   Wanda Wood is a 53 y.o. female patient who presents to the emergency department today with upper lip swelling that started this morning.  Patient states that she has been treating some spots with essential oils which she feels might be basal cell carcinoma.  She states her father had this and she spent a lot of time on the water.  She has been treating them with frankincense and myrrh.  She states that the spot right above the right upper lip started getting very swollen today.  She denies any trouble breathing, trouble swallowing.  She has called the dermatology office and stated that she needed a referral.    Rash      Prior to Admission medications   Medication Sig Start Date End Date Taking? Authorizing Provider  predniSONE  (DELTASONE ) 20 MG tablet Take 2 tablets (40 mg total) by mouth daily. 07/22/23  Yes Jamal Mays, Tache Bobst M, PA-C  acetaminophen  (TYLENOL ) 500 MG tablet Take 500 mg by mouth every 6 (six) hours as needed for moderate pain or mild pain.    [provider]  ALPRAZolam  (XANAX ) 0.25 MG tablet Take 1 tablet (0.25 mg total) by mouth at bedtime as needed for anxiety. 07/19/23   de [place], Raymond J, MD  aspirin  81 MG chewable tablet Chew 81 mg by mouth daily. 08/16/22   [provider]  DULoxetine  (CYMBALTA ) 30 MG capsule Take 1 capsule (30 mg total) by mouth daily. 06/23/23   de [place], Alonza Jansky, MD  Evolocumab  (REPATHA  SURECLICK) 140 MG/ML SOAJ Inject 140 mg into the skin every 14 (fourteen) days. 09/23/22   O'NealCathay Clonts, MD  furosemide  (LASIX ) 20 MG tablet Take 1 tablet (20 mg total) by mouth daily as needed. For weight gain 3lb overnight, 5lbs in a week, shortness of breath or leg edema 06/15/22   Felicita Horns, FNP  nitroGLYCERIN  (NITROSTAT ) 0.4 MG SL tablet Place 1 tablet (0.4 mg total) under  the tongue every 5 (five) minutes x 3 doses as needed for chest pain. 03/21/21   Carie Charity, NP  rosuvastatin  (CRESTOR ) 40 MG tablet Take 1 tablet (40 mg total) by mouth daily. Please call PCP for an appointment. 07/21/23   de [place], Raymond J, MD    Allergies: Atorvastatin , Lactose intolerance (gi), Soy allergy (obsolete), Sulfa antibiotics, Sulfites, Asa [aspirin ], and Molds & smuts    Review of Systems  Skin:  Positive for rash.  All other systems reviewed and are negative.   Updated Vital Signs BP 124/68   Pulse 67   Temp 98.7 F (37.1 C) (Oral)   Resp 18   SpO2 97%   Physical Exam Vitals and nursing note reviewed.  Constitutional:      Appearance: Normal appearance.  HENT:     Head: Normocephalic and atraumatic.     Comments: Moderate lip swelling over the right upper lip.  Overlying rash of the area.  There is also some lesions to the nose and left temporal area.  Eyes:     General:        Right eye: No discharge.        Left eye: No discharge.     Conjunctiva/sclera: Conjunctivae normal.   Pulmonary:     Effort: Pulmonary effort is normal.   Skin:    General: Skin is warm and dry.  Findings: No rash.   Neurological:     General: No focal deficit present.     Mental Status: She is alert.   Psychiatric:        Mood and Affect: Mood normal.        Behavior: Behavior normal.     (all labs ordered are listed, but only abnormal results are displayed) Labs Reviewed - No data to display  EKG: None  Radiology: No results found.   Procedures   Medications Ordered in the ED  dexamethasone  (DECADRON ) injection 10 mg (10 mg Intramuscular Given 07/22/23 1325)      Medical Decision Making Wanda Wood is a 53 y.o. female patient who presents to the emergency department today for further evaluation of upper right lip swelling.  No evidence of ACE or ARB use.  Likely reaction from the essential oils.  Patient feeling better after 10 mg of Decadron   intramuscular.  Will have her follow-up with dermatology.  I placed ambulatory referral and given the patient the number to call.  Also prescribe steroid burst.  Strict turn precautions were discussed.  She is safe for discharge.   Risk Prescription drug management.     Final diagnoses:  Lip swelling    ED Discharge Orders          Ordered    predniSONE  (DELTASONE ) 20 MG tablet  Daily        07/22/23 1530    Ambulatory referral to Dermatology        07/22/23 1532               Angelyn Kennel Lamesa, Kirby Peoples 07/22/23 1535    Sueellen Emery, MD 07/23/23 [phone]

## 2023-07-22 NOTE — Discharge Instructions (Signed)
 As we discussed, I have sent a steroid burst to your pharmacy.  Please take as prescribed.  I have also placed an amatory referral to [facility] health dermatology.  I also given you the number to contact them as well.  Please return to the emergency department for any worsening symptoms.

## 2023-07-22 NOTE — ED Triage Notes (Incomplete)
 Lip swelling and sores on face. Recently started Cymbalta  last month and essential oils  on face. No difficulty breathing.

## 2023-07-23 ENCOUNTER — Other Ambulatory Visit (HOSPITAL_BASED_OUTPATIENT_CLINIC_OR_DEPARTMENT_OTHER): Payer: Self-pay

## 2023-08-03 ENCOUNTER — Other Ambulatory Visit (HOSPITAL_BASED_OUTPATIENT_CLINIC_OR_DEPARTMENT_OTHER): Payer: Self-pay

## 2023-08-06 ENCOUNTER — Other Ambulatory Visit (HOSPITAL_BASED_OUTPATIENT_CLINIC_OR_DEPARTMENT_OTHER): Payer: Self-pay

## 2023-08-17 ENCOUNTER — Other Ambulatory Visit (HOSPITAL_BASED_OUTPATIENT_CLINIC_OR_DEPARTMENT_OTHER): Payer: Self-pay

## 2023-08-27 ENCOUNTER — Ambulatory Visit: Admitting: Gastroenterology

## 2023-08-31 ENCOUNTER — Encounter: Payer: Self-pay | Admitting: Physician Assistant

## 2023-08-31 ENCOUNTER — Ambulatory Visit (INDEPENDENT_AMBULATORY_CARE_PROVIDER_SITE_OTHER): Admitting: Physician Assistant

## 2023-08-31 VITALS — BP 113/75

## 2023-08-31 DIAGNOSIS — C44311 Basal cell carcinoma of skin of nose: Secondary | ICD-10-CM | POA: Diagnosis not present

## 2023-08-31 DIAGNOSIS — L570 Actinic keratosis: Secondary | ICD-10-CM | POA: Diagnosis not present

## 2023-08-31 DIAGNOSIS — D485 Neoplasm of uncertain behavior of skin: Secondary | ICD-10-CM

## 2023-08-31 DIAGNOSIS — L814 Other melanin hyperpigmentation: Secondary | ICD-10-CM

## 2023-08-31 DIAGNOSIS — Z1283 Encounter for screening for malignant neoplasm of skin: Secondary | ICD-10-CM

## 2023-08-31 DIAGNOSIS — W908XXA Exposure to other nonionizing radiation, initial encounter: Secondary | ICD-10-CM

## 2023-08-31 DIAGNOSIS — L821 Other seborrheic keratosis: Secondary | ICD-10-CM

## 2023-08-31 DIAGNOSIS — D692 Other nonthrombocytopenic purpura: Secondary | ICD-10-CM

## 2023-08-31 DIAGNOSIS — L578 Other skin changes due to chronic exposure to nonionizing radiation: Secondary | ICD-10-CM

## 2023-08-31 DIAGNOSIS — C44619 Basal cell carcinoma of skin of left upper limb, including shoulder: Secondary | ICD-10-CM

## 2023-08-31 DIAGNOSIS — Z86018 Personal history of other benign neoplasm: Secondary | ICD-10-CM

## 2023-08-31 DIAGNOSIS — D229 Melanocytic nevi, unspecified: Secondary | ICD-10-CM

## 2023-08-31 DIAGNOSIS — D492 Neoplasm of unspecified behavior of bone, soft tissue, and skin: Secondary | ICD-10-CM | POA: Diagnosis not present

## 2023-08-31 DIAGNOSIS — C4441 Basal cell carcinoma of skin of scalp and neck: Secondary | ICD-10-CM | POA: Diagnosis not present

## 2023-08-31 DIAGNOSIS — D1801 Hemangioma of skin and subcutaneous tissue: Secondary | ICD-10-CM

## 2023-08-31 DIAGNOSIS — Z85828 Personal history of other malignant neoplasm of skin: Secondary | ICD-10-CM

## 2023-08-31 NOTE — Progress Notes (Signed)
 New Patient Visit   Subjective  Wanda Wood is a 53 y.o. female who presents for the following: Skin Cancer Screening and Full Body Skin Exam - History of multiple BCC (SIX) and dysplastic nevus of right inframammary. Family history of BCC in father.  Has undergone Mohs surgery, surgical excision as well as ED&C for BCC and DN at [facility] here in [place].   One lesion on left upper arm has been biopsied but not treated. She is wanting to transfer her care here and also have this lesion treated.   The patient presents for Total-Body Skin Exam (TBSE) for skin cancer screening and mole check. The patient has spots, moles and lesions to be evaluated, some may be new or changing and the patient may have concern these could be cancer.    The following portions of the chart were reviewed this encounter and updated as appropriate: medications, allergies, medical history  Review of Systems:  No other skin or systemic complaints except as noted in HPI or Assessment and Plan.  Objective  Well appearing patient in no apparent distress; mood and affect are within normal limits.  A full examination was performed including scalp, head, eyes, ears, nose, lips, neck, chest, axillae, abdomen, back, buttocks, bilateral upper extremities, bilateral lower extremities, hands, feet, fingers, toes, fingernails, and toenails. All findings within normal limits unless otherwise noted below.   Relevant physical exam findings are noted in the Assessment and Plan.  Right Forehead 0.6 cm pearly pink papule (labeled B in Media tab)  Left Frontal Scalp 1.1 cm pink plaque (labeled C in Media tab)  Left Temple 1.1 cm  pink plaque (labeled D in Media tab)  Dorsum of Nose 1.9 cm pink plaque (labeled E in Media tab)  Left Upper Arm - Anterior Healing biopsy site - 2.0 cm (labeled A in Media tab)   Assessment & Plan   SKIN CANCER SCREENING PERFORMED TODAY.  BASAL CELL CARCINOMA OF LEFT UPPER  ARM  - request prior pathology records for review and plan Mohs surgery.   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  - Call if any new or changing lesions are noted between office visits   ACTINIC DAMAGE - Chronic condition, secondary to cumulative UV/sun exposure - diffuse scaly erythematous macules with underlying dyspigmentation - Recommend daily broad spectrum sunscreen SPF 30+ to sun-exposed areas, reapply every 2 hours as needed.  - Staying in the shade or wearing long sleeves, sun glasses (UVA+UVB protection) and wide brim hats (4-inch brim around the entire circumference of the hat) are also recommended for sun protection.  - Call for new or changing lesions.  LENTIGINES, SEBORRHEIC KERATOSES, HEMANGIOMAS - Benign normal skin lesions - Benign-appearing - Call for any changes  MELANOCYTIC NEVI - Tan-brown and/or pink-flesh-colored symmetric macules and papules - Benign appearing on exam today - Observation - Call clinic for new or changing moles - Recommend daily use of broad spectrum spf 30+ sunscreen to sun-exposed areas.   History of Dysplastic Nevus of Right Inframamamry - No evidence of recurrence today - Recommend regular full body skin exams - Recommend daily broad spectrum sunscreen SPF 30+ to sun-exposed areas, reapply every 2 hours as needed.  -  Call if any new or changing lesions are noted between office visits  Purpura - Chronic; persistent and recurrent.  Treatable, but not curable. - Violaceous macules and patches - Benign - Related to trauma, age, sun damage and/or use of blood thinners, chronic use of topical and/or oral steroids - Observe - Can use OTC arnica containing moisturizer  such as Dermend Bruise Formula if desired - Call for worsening or other concerns   NEOPLASM OF UNCERTAIN BEHAVIOR OF SKIN (4) Right Forehead Skin / nail biopsy Type of biopsy: tangential   Informed consent: discussed and consent obtained   Timeout: patient name, date of birth, surgical site, and procedure verified   Procedure prep:  Patient was prepped and draped in usual sterile fashion Prep type:  Isopropyl alcohol Anesthesia: the lesion was anesthetized in a standard fashion   Anesthetic:  1% lidocaine  w/ epinephrine 1-100,000 buffered w/ 8.4% NaHCO3 Instrument used: flexible razor blade   Hemostasis achieved with: pressure, aluminum chloride and electrodesiccation   Outcome: patient tolerated procedure well   Post-procedure details: sterile dressing applied and wound care instructions given   Dressing type: bandage and petrolatum    Specimen 1 - Surgical pathology Differential Diagnosis: BCC vs other   Check Margins: No Left Frontal Scalp Skin / nail biopsy Type of biopsy: tangential   Informed consent: discussed and consent obtained   Timeout: patient name, date of birth, surgical site, and procedure verified   Procedure prep:  Patient was prepped and draped in usual sterile fashion Prep type:  Isopropyl alcohol Anesthesia: the lesion was anesthetized in a standard fashion   Anesthetic:  1% lidocaine  w/ epinephrine 1-100,000 buffered w/ 8.4% NaHCO3 Instrument used: flexible razor blade   Hemostasis achieved with: pressure, aluminum chloride and electrodesiccation   Outcome: patient tolerated procedure well   Post-procedure details: sterile dressing applied and wound care instructions given   Dressing type: bandage and petrolatum    Specimen 2 - Surgical pathology Differential Diagnosis: BCC vs other   Check Margins: No Left Temple Skin / nail biopsy Type of biopsy: tangential   Informed consent: discussed and consent obtained   Timeout: patient name, date of birth,  surgical site, and procedure verified   Procedure prep:  Patient was prepped and draped in usual sterile fashion Prep type:  Isopropyl alcohol Anesthesia: the lesion was anesthetized in a standard fashion   Anesthetic:  1% lidocaine  w/ epinephrine 1-100,000 buffered w/ 8.4% NaHCO3 Instrument used: flexible razor blade   Hemostasis achieved with: pressure, aluminum chloride and electrodesiccation   Outcome: patient tolerated procedure well   Post-procedure details: sterile dressing applied and wound care instructions given   Dressing type: bandage and petrolatum    Specimen 3 - Surgical pathology Differential Diagnosis: BCC vs other   Check Margins: No Dorsum of Nose Skin / nail biopsy Type of biopsy: tangential   Informed consent: discussed and consent obtained   Timeout: patient name, date of birth, surgical site, and procedure verified   Procedure prep:  Patient was prepped and draped in usual sterile fashion Prep type:  Isopropyl alcohol Anesthesia: the lesion was anesthetized in a standard fashion   Anesthetic:  1% lidocaine  w/ epinephrine 1-100,000 buffered w/ 8.4% NaHCO3 Instrument used: flexible razor blade   Hemostasis achieved with: pressure, aluminum chloride and electrodesiccation   Outcome: patient tolerated procedure well   Post-procedure details: sterile dressing applied and wound care instructions given   Dressing type: bandage and petrolatum    Specimen 4 - Surgical pathology  Differential Diagnosis: BCC vs other   Check Margins: No BASAL CELL CARCINOMA OF LEFT UPPER EXTREMITY Left Upper Arm - Anterior Biopsy proven BCC (Dr Tricia did biopsy) - she prefers to have it treated her in our office to keep all her care within the [facility] system. We will schedule her for Mohs with Dr Corey. SCREENING EXAM FOR SKIN CANCER   ACTINIC SKIN DAMAGE   LENTIGINES   SEBORRHEIC KERATOSIS   CHERRY ANGIOMA   MULTIPLE BENIGN NEVI   HISTORY OF BASAL CELL  CANCER   HISTORY OF DYSPLASTIC NEVUS   OTHER NONTHROMBOCYTOPENIC PURPURA (HCC)    Return in about 4 months (around 01/01/2024) for Follow up.  I, Roseline Hutchinson, CMA, am acting as scribe for Crecencio Kwiatek K, PA-C .   Documentation: I have reviewed the above documentation for accuracy and completeness, and I agree with the above.  January Bergthold K, PA-C

## 2023-08-31 NOTE — Patient Instructions (Addendum)

## 2023-09-01 LAB — SURGICAL PATHOLOGY

## 2023-09-06 ENCOUNTER — Ambulatory Visit: Payer: Self-pay | Admitting: Physician Assistant

## 2023-09-06 ENCOUNTER — Encounter (HOSPITAL_BASED_OUTPATIENT_CLINIC_OR_DEPARTMENT_OTHER): Admitting: Family Medicine

## 2023-09-13 ENCOUNTER — Ambulatory Visit: Admitting: Physician Assistant

## 2023-09-13 ENCOUNTER — Encounter: Payer: Self-pay | Admitting: Physician Assistant

## 2023-09-13 ENCOUNTER — Other Ambulatory Visit (HOSPITAL_BASED_OUTPATIENT_CLINIC_OR_DEPARTMENT_OTHER): Payer: Self-pay

## 2023-09-13 DIAGNOSIS — C44619 Basal cell carcinoma of skin of left upper limb, including shoulder: Secondary | ICD-10-CM | POA: Diagnosis not present

## 2023-09-13 DIAGNOSIS — L570 Actinic keratosis: Secondary | ICD-10-CM

## 2023-09-13 DIAGNOSIS — C44311 Basal cell carcinoma of skin of nose: Secondary | ICD-10-CM

## 2023-09-13 DIAGNOSIS — L910 Hypertrophic scar: Secondary | ICD-10-CM

## 2023-09-13 DIAGNOSIS — C4441 Basal cell carcinoma of skin of scalp and neck: Secondary | ICD-10-CM

## 2023-09-13 MED ORDER — IMIQUIMOD 5 % EX CREA
TOPICAL_CREAM | CUTANEOUS | 1 refills | Status: DC
  Filled 2023-09-13: qty 24, 24d supply, fill #0
  Filled 2023-10-15: qty 24, 24d supply, fill #1

## 2023-09-13 NOTE — Progress Notes (Signed)
   Follow-Up Visit   Subjective  Wanda Wood is a 53 y.o. female who presents for the following: Biopsy follow up - discussion of test results and plan moving forward.    The following portions of the chart were reviewed this encounter and updated as appropriate: medications, allergies, medical history  Review of Systems:  No other skin or systemic complaints except as noted in HPI or Assessment and Plan.  Objective  Well appearing patient in no apparent distress; mood and affect are within normal limits.   A focused examination was performed of the following areas: Face and left upper arm.   Relevant exam findings are noted in the Assessment and Plan.     Left Shoulder - Anterior Healing biopsy site - 2.0 cm Left Temple, Right Forehead Healing biopsy sites Dorsum of Nose Healing biopsy site Left Frontal Scalp Healing biopsy site  Assessment & Plan   HYPERTROPHIC SCAR (chest)  - discussed treatment options to include massage, silicon gel as well as different types of lasers.     BASAL CELL CARCINOMA (BCC) OF SKIN OF LEFT UPPER EXTREMITY INCLUDING SHOULDER Left Shoulder - Anterior Will plan Mohs with Dr Corey AK (ACTINIC KERATOSIS) (2) Left Temple, Right Forehead Start imiquimod  5% cream twice daily x 2 weeks. BASAL CELL CARCINOMA (BCC) OF SKIN OF NOSE Dorsum of Nose SUPERFICIAL BCC  Start Imiquimod  5% cream daily to nose x 6 weeks BASAL CELL CARCINOMA (BCC) OF SCALP Left Frontal Scalp We will plan Mohs with Dr Corey HYPERTROPHIC SCAR    Return for Mohs - [facility] of left frontal scalp and left shoulder with Dr Corey, 10 week follow up with Erminio.  I, Roseline Hutchinson, CMA, am acting as scribe for Kamsiyochukwu Buist K, PA-C .  Plan of care formulated with/by Dr. Corey who also saw the patient and answered any unanswered questions. Also discussed superficial radiation treatment, as patient has been reading a lot about that as a treatment for skin cancers. This  treatment is NOT the standard of care and Dr. Corey does not recommend.    Documentation: I have reviewed the above documentation for accuracy and completeness, and I agree with the above.  Anushri Casalino K, PA-C

## 2023-09-13 NOTE — Progress Notes (Signed)
 Right forehead - AK - Imiquimod  as directed  Left frontal scalp - BCC (infiltrative) - Mohs  Left temple - AK - Imiquimod  as directed  Dorsum of nose - sBCC - Imiquimod  as directed  Patient seen and results reviewed and Rx sent in. Need to send to front desk to schedule Mohs for #2 above as well as a biopsy proven BCC on her left upper arm. So to clarify - we need to schedule 2 Mohs procedures. I also want a 10 week f/u with me Wanda Wood) to f/u use of Imiquimod .

## 2023-09-13 NOTE — Patient Instructions (Signed)

## 2023-09-16 ENCOUNTER — Other Ambulatory Visit: Payer: Self-pay

## 2023-09-16 ENCOUNTER — Other Ambulatory Visit (HOSPITAL_BASED_OUTPATIENT_CLINIC_OR_DEPARTMENT_OTHER): Payer: Self-pay

## 2023-10-05 ENCOUNTER — Other Ambulatory Visit: Payer: Self-pay

## 2023-10-05 ENCOUNTER — Other Ambulatory Visit (HOSPITAL_BASED_OUTPATIENT_CLINIC_OR_DEPARTMENT_OTHER): Payer: Self-pay

## 2023-10-05 ENCOUNTER — Ambulatory Visit (INDEPENDENT_AMBULATORY_CARE_PROVIDER_SITE_OTHER): Admitting: Family Medicine

## 2023-10-05 ENCOUNTER — Encounter (HOSPITAL_BASED_OUTPATIENT_CLINIC_OR_DEPARTMENT_OTHER): Payer: Self-pay | Admitting: Family Medicine

## 2023-10-05 VITALS — BP 113/65 | HR 59 | Ht 64.5 in | Wt 149.3 lb

## 2023-10-05 DIAGNOSIS — Z85828 Personal history of other malignant neoplasm of skin: Secondary | ICD-10-CM | POA: Insufficient documentation

## 2023-10-05 DIAGNOSIS — D485 Neoplasm of uncertain behavior of skin: Secondary | ICD-10-CM | POA: Insufficient documentation

## 2023-10-05 DIAGNOSIS — Z0000 Encounter for general adult medical examination without abnormal findings: Secondary | ICD-10-CM | POA: Diagnosis not present

## 2023-10-05 DIAGNOSIS — E7801 Familial hypercholesterolemia: Secondary | ICD-10-CM | POA: Diagnosis not present

## 2023-10-05 DIAGNOSIS — I2510 Atherosclerotic heart disease of native coronary artery without angina pectoris: Secondary | ICD-10-CM | POA: Diagnosis not present

## 2023-10-05 DIAGNOSIS — C44519 Basal cell carcinoma of skin of other part of trunk: Secondary | ICD-10-CM | POA: Insufficient documentation

## 2023-10-05 DIAGNOSIS — Z9861 Coronary angioplasty status: Secondary | ICD-10-CM | POA: Diagnosis not present

## 2023-10-05 MED ORDER — DULOXETINE HCL 20 MG PO CPEP
40.0000 mg | ORAL_CAPSULE | Freq: Every day | ORAL | 1 refills | Status: DC
  Filled 2023-10-05 (×3): qty 180, 90d supply, fill #0

## 2023-10-05 MED ORDER — ROSUVASTATIN CALCIUM 40 MG PO TABS
40.0000 mg | ORAL_TABLET | Freq: Every day | ORAL | 3 refills | Status: AC
  Filled 2023-10-05 – 2023-10-24 (×2): qty 90, 90d supply, fill #0
  Filled 2024-01-17: qty 90, 90d supply, fill #1

## 2023-10-05 MED ORDER — REPATHA SURECLICK 140 MG/ML ~~LOC~~ SOAJ
140.0000 mg | SUBCUTANEOUS | 11 refills | Status: AC
  Filled 2023-10-05 – 2023-10-24 (×2): qty 2, 28d supply, fill #0
  Filled 2023-11-17: qty 2, 28d supply, fill #1
  Filled 2023-12-25: qty 2, 28d supply, fill #2
  Filled 2024-01-19: qty 2, 28d supply, fill #3
  Filled 2024-02-16: qty 2, 28d supply, fill #4
  Filled 2024-02-21 – 2024-03-15 (×2): qty 2, 28d supply, fill #5

## 2023-10-05 NOTE — Assessment & Plan Note (Addendum)
 Routine HCM labs reviewed. HCM reviewed/discussed. Anticipatory guidance regarding healthy weight, lifestyle and choices given. Recommend healthy diet.  Recommend approximately 150 minutes/week of moderate intensity exercise Recommend regular dental and vision exams Always use seatbelt/lap and shoulder restraints Recommend using smoke alarms and checking batteries at least twice a year Recommend using sunscreen when outside Discussed colon cancer screening recommendations, options.  Patient has upcoming appointment Discussed mammogram, patient declines today Discussed immunization recommendations

## 2023-10-05 NOTE — Patient Instructions (Signed)
 ?  Medication Instructions:  ?Your physician recommends that you continue on your current medications as directed. Please refer to the Current Medication list given to you today. ?--If you need a refill on any your medications before your next appointment, please call your pharmacy first. If no refills are authorized on file call the office.-- ? ? ? ?Follow-Up: ?Your next appointment:   ?Your physician recommends that you schedule a follow-up appointment in: 3 month follow-up with Dr. de [place] ? ?You will receive a text message or e-mail with a link to a survey about your care and experience with [place] today! We would greatly appreciate your feedback!  ? ?Thanks for letting us be apart of your health journey!!  ?Primary Care and Sports Medicine  ? ?Dr. Marcy Salvo de [place]  ? ?We encourage you to activate your patient portal called "MyChart".  Sign up information is provided on this After Visit Summary.  MyChart is used to connect with patients for Virtual Visits (Telemedicine).  Patients are able to view lab/test results, encounter notes, upcoming appointments, etc.  Non-urgent messages can be sent to your provider as well. To learn more about what you can do with MyChart, please visit --  [email or web address].    ?

## 2023-10-05 NOTE — Progress Notes (Signed)
 Subjective:    CC: Annual Physical Exam  HPI: Wanda Wood is a 53 y.o. presenting for annual physical  I reviewed the past medical history, family history, social history, surgical history, and allergies today and no changes were needed.  Please see the problem list section below in epic for further details.  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   Anxiety 1998   Spoke with ob/gyn. Prescription for Xanax  for short period.   Arthritis 2005   Chiropractor mentioned inflammation throughout my spine   Basal cell carcinoma 07/22/2022   Left sup clavicle - excised   Basal cell carcinoma 09/09/2022   Right lat chest - excised   Basal cell carcinoma 09/30/2022   Right medial chest - Mohs (Dr Leigh)   Basal cell carcinoma 07/31/2022   Left shoulder - needs Mohs   Basal cell carcinoma 09/13/2023   Left frontal scalp - needs Mohs   Basal cell carcinoma 09/13/2023   Dorsum nose - Imiquimod  cream   Cancer (HCC)    Skin/Basil cell   Depression 1987   Depressed in high school   High cholesterol    Myocardial infarction [facility])    Preventative health care 04/29/2021   Systolic CHF, acute (HCC)    Past Surgical History: Past Surgical History:  Procedure Laterality Date   CERVICAL SPINE SURGERY     CORONARY STENT INTERVENTION N/A 01/20/2021   Procedure: CORONARY STENT INTERVENTION;  Surgeon: Court Dorn PARAS, MD;  Location: [facility] INVASIVE CV LAB;  Service: Cardiovascular;  Laterality: N/A;   LEFT HEART CATH AND CORONARY ANGIOGRAPHY N/A 01/20/2021   Procedure: LEFT HEART CATH AND CORONARY ANGIOGRAPHY;  Surgeon: Court Dorn PARAS, MD;  Location: [facility] INVASIVE CV LAB;  Service: Cardiovascular;  Laterality: N/A;   SPINE SURGERY  11/06/2010   Spinal fusion C5-C7   Social History: Social History   Socioeconomic History   Marital status: Single    Spouse name: Not on file   Number of children: 0   Years of  education: Not on file   Highest education level: Associate degree: occupational, Scientist, product/process development, or vocational program  Occupational History   Occupation: bartender/waitress    Comment: part time  Tobacco Use   Smoking status: Former    Current packs/day: 0.00    Average packs/day: 0.5 packs/day for 3.0 years (1.5 ttl pk-yrs)    Types: Cigarettes    Quit date: 01/21/2016    Years since quitting: 7.7    Passive exposure: Past   Smokeless tobacco: Never   Tobacco comments:    Started at age 57 and quit at age 26. 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   Social Drivers of Corporate investment banker Strain: Medium Risk (  06/23/2023)   Overall Financial Resource Strain (CARDIA)    Difficulty of Paying Living Expenses: Somewhat hard  Food Insecurity: No Food Insecurity (06/23/2023)   Hunger Vital Sign    Worried About Running Out of Food in the Last Year: Never true    Ran Out of Food in the Last Year: Never true  Transportation Needs: No Transportation Needs (06/23/2023)   PRAPARE - Administrator, Civil Service (Medical): No    Lack of Transportation (Non-Medical): No  Physical Activity: Insufficiently Active (06/23/2023)   Exercise Vital Sign    Days of Exercise per Week: 1 day    Minutes of Exercise per Session: 30 min  Stress: Stress Concern Present (06/23/2023)   [employer] of Occupational Health - Occupational Stress Questionnaire     Feeling of Stress : Rather much  Social Connections: Moderately Isolated (06/23/2023)   Social Connection and Isolation Panel    Frequency of Communication with Friends and Family: More than three times a week    Frequency of Social Gatherings with Friends and Family: Once a week    Attends Religious Services: More than 4 times per year    Active Member of [employer] or Organizations: No    Attends Engineer, structural: Not on file    Marital Status: Never married   Family History: Family History  Problem Relation Age of Onset   Thyroid  disease Mother    Heart disease Father    Heart attack Father 81   Cancer Maternal Grandmother 50       uterine cancer   Allergies: Allergies  Allergen Reactions   Atorvastatin  Diarrhea   Lactose Intolerance (Gi) Other (See Comments)    GI upset, SOB   Soy Allergy (Obsolete) Other (See Comments)    GI upset   Sulfa Antibiotics Nausea And Vomiting   Sulfites     Specifically mentioned as an allergy to an  ingredient in inhalers.   Dorethia Cooley ]     Joint stiffness and swelling   Molds & Smuts Rash   Medications: See med rec.  Review of Systems: No headache, visual changes, nausea, vomiting, diarrhea, constipation, dizziness, abdominal pain, skin rash, fevers, chills, night sweats, swollen lymph nodes, weight loss, chest pain, body aches, joint swelling, muscle aches, shortness of breath, mood changes, visual or auditory hallucinations.  Objective:    BP 113/65 (BP Location: Left Arm, Patient Position: Sitting, Cuff Size: Normal)   Pulse (!) 59   Ht 5' 4.5 (1.638 m)   Wt 149 lb 4.8 oz (67.7 kg)   SpO2 98%   BMI 25.23 kg/m   General: Well Developed, well nourished, and in no acute distress. Neuro: Alert and oriented x3, extra-ocular muscles intact, sensation grossly intact. Cranial nerves II through XII are intact, motor, sensory, and coordinative functions are all intact. HEENT: Normocephalic, atraumatic, pupils equal round reactive  to light, neck supple, no masses, no lymphadenopathy, thyroid  nonpalpable. Oropharynx, nasopharynx, external ear canals are unremarkable. Skin: Warm and dry, no rashes noted. Cardiac: Regular rate and rhythm, no murmurs rubs or gallops. Respiratory: Clear to auscultation bilaterally. Not using accessory muscles, speaking in full sentences. Abdominal: Soft, nontender, nondistended, positive bowel sounds, no masses, no organomegaly. Musculoskeletal: Shoulder, elbow, wrist, hip, knee, ankle stable, and with full range of motion.  Impression and Recommendations:    Wellness examination Assessment & Plan: Routine HCM labs reviewed. HCM reviewed/discussed. Anticipatory guidance regarding healthy weight, lifestyle and choices given. Recommend healthy diet.  Recommend approximately 150  minutes/week of moderate intensity exercise Recommend regular dental and vision exams Always use seatbelt/lap and shoulder restraints Recommend using smoke alarms and checking batteries at least twice a year Recommend using sunscreen when outside Discussed colon cancer screening recommendations, options.  Patient has upcoming appointment Discussed mammogram, patient declines today Discussed immunization recommendations   Coronary artery disease involving native coronary artery of native heart without angina pectoris -     Repatha  SureClick; Inject 140 mg into the skin every 14 (fourteen) days.  Dispense: 2 mL; Refill: 11  CAD S/P percutaneous coronary angioplasty -     Repatha  SureClick; Inject 140 mg into the skin every 14 (fourteen) days.  Dispense: 2 mL; Refill: 11  Homozygous familial hypercholesterolemia -     Repatha  SureClick; Inject 140 mg into the skin every 14 (fourteen) days.  Dispense: 2 mL; Refill: 11  Other orders -     Rosuvastatin  Calcium ; Take 1 tablet (40 mg total) by mouth daily.  Dispense: 90 tablet; Refill: 3 -     DULoxetine  HCl; Take 2 capsules (40 mg total) by mouth daily.  Dispense:  180 capsule; Refill: 1  Return in about 3 months (around 01/05/2024) for med check.   ___________________________________________ Annalicia Renfrew de [place], MD, ABFM, [facility] Primary Care and Sports Medicine [facility]

## 2023-10-06 ENCOUNTER — Other Ambulatory Visit: Payer: Self-pay

## 2023-10-06 ENCOUNTER — Other Ambulatory Visit (HOSPITAL_COMMUNITY): Payer: Self-pay

## 2023-10-06 ENCOUNTER — Other Ambulatory Visit (HOSPITAL_BASED_OUTPATIENT_CLINIC_OR_DEPARTMENT_OTHER): Payer: Self-pay

## 2023-10-07 ENCOUNTER — Other Ambulatory Visit (HOSPITAL_BASED_OUTPATIENT_CLINIC_OR_DEPARTMENT_OTHER): Payer: Self-pay

## 2023-10-15 ENCOUNTER — Other Ambulatory Visit: Payer: Self-pay

## 2023-10-16 ENCOUNTER — Other Ambulatory Visit (HOSPITAL_BASED_OUTPATIENT_CLINIC_OR_DEPARTMENT_OTHER): Payer: Self-pay

## 2023-10-19 ENCOUNTER — Other Ambulatory Visit (HOSPITAL_BASED_OUTPATIENT_CLINIC_OR_DEPARTMENT_OTHER): Payer: Self-pay

## 2023-10-19 ENCOUNTER — Encounter: Payer: Self-pay | Admitting: Gastroenterology

## 2023-10-19 ENCOUNTER — Ambulatory Visit: Admitting: Gastroenterology

## 2023-10-19 VITALS — BP 108/64 | HR 66 | Ht 64.0 in | Wt 149.2 lb

## 2023-10-19 DIAGNOSIS — Z1211 Encounter for screening for malignant neoplasm of colon: Secondary | ICD-10-CM

## 2023-10-19 DIAGNOSIS — K5909 Other constipation: Secondary | ICD-10-CM | POA: Diagnosis not present

## 2023-10-19 MED ORDER — NA SULFATE-K SULFATE-MG SULF 17.5-3.13-1.6 GM/177ML PO SOLN: 1.0000 | Freq: Once | ORAL | 0 refills | Status: AC

## 2023-10-19 MED ORDER — NA SULFATE-K SULFATE-MG SULF 17.5-3.13-1.6 GM/177ML PO SOLN
1.0000 | Freq: Once | ORAL | 0 refills | Status: DC
  Filled 2023-10-19: qty 354, 1d supply, fill #0

## 2023-10-19 NOTE — Patient Instructions (Addendum)
 You have been scheduled for a colonoscopy. Please follow written instructions given to you at your visit today.   If you use inhalers (even only as needed), please bring them with you on the day of your procedure.  DO NOT TAKE 7 DAYS PRIOR TO TEST- Trulicity (dulaglutide) Ozempic, Wegovy (semaglutide) Mounjaro (tirzepatide) Bydureon Bcise (exanatide extended release)  DO NOT TAKE 1 DAY PRIOR TO YOUR TEST Rybelsus (semaglutide) Adlyxin (lixisenatide) Victoza (liraglutide) Byetta (exanatide) ___________________________________________________________________________  _______________________________________________________  If your blood pressure at your visit was 140/90 or greater, please contact your primary care physician to follow up on this.  _______________________________________________________  If you are age 49 or older, your body mass index should be between 23-30. Your Body mass index is 25.62 kg/m. If this is out of the aforementioned range listed, please consider follow up with your Primary Care Provider.  If you are age 45 or younger, your body mass index should be between 19-25. Your Body mass index is 25.62 kg/m. If this is out of the aformentioned range listed, please consider follow up with your Primary Care Provider.   ________________________________________________________  The [facility] providers would like to encourage you to use MYCHART to communicate with providers for non-urgent requests or questions.  Due to long hold times on the telephone, sending your provider a message by [facility] may be a faster and more efficient way to get a response.  Please allow 48 business hours for a response.  Please remember that this is for non-urgent requests.  _______________________________________________________  [facility] is using a team-based approach to care.  Your team is made up of your doctor and two to three APPS. Our APPS (Nurse Practitioners and  Physician Assistants) work with your physician to ensure care continuity for you. They are fully qualified to address your health concerns and develop a treatment plan. They communicate directly with your gastroenterologist to care for you. Seeing the Advanced Practice Practitioners on your physician's team can help you by facilitating care more promptly, often allowing for earlier appointments, access to diagnostic testing, procedures, and other specialty referrals.    Thank you for trusting me with your gastrointestinal care!    Dr. Victory Legrand DOUGLAS [facility]

## 2023-10-19 NOTE — Progress Notes (Signed)
 [facility] Consult Note:  History: Wanda Wood 10/19/2023  Referring provider: de [place], Quintin PARAS, MD  Reason for consult/chief complaint: Constipation (Patient feels the constipation has gotten worse since she has started cholesterol medication. Has modified diet and this has helped some.) and Colon Cancer Screening (Patient discussed colon screening with PCP, and he feels she should be screened. )   Subjective  Prior history:  No prior GI history in this EHR Referred by primary care for constipation  DES to a 99% LAD lesion December 2022, at which time she had markedly reduced LVEF.              LVEF normalized with follow-up echocardiogram March 2023   Discussed the use of AI scribe software for clinical note transcription with the patient, who gave verbal consent to proceed.  History of Present Illness Wanda Wood is a 53 year old female who presents with bowel habit changes. She was referred by Dr. Penne for evaluation of bowel habit changes.  She has been experiencing changes in her bowel habits, including dry stools and occasional difficulty with bowel movements. She has bowel movements at least once a day, sometimes twice, especially when maintaining a diet rich in roughage, fruits, and vegetables. She started taking a pre-/pro biotic supplement, which she feels has helped a lot with the diet changes.  So stools are more frequent and she feels better evacuated at this point, though sometimes the stool is still dry.  She drinks primarily water since her heart attack and avoids sodas and sweets. She has incorporated grape nuts and blueberries into her morning routine, which she finds beneficial for her bowel movements.  She is currently on two types of cholesterol medications and Cymbalta  for general anxiety disorder. Her bowel habit issues predated the start of Cymbalta . She also has a prescription for furosemide , which she uses as needed, but has not  required it recently due to lack of ankle swelling.  She has never seen a GI specialist before and has not undergone colorectal cancer screening, such as stool tests or colonoscopy, due to previous insurance issues. She recalls a recommendation for a Cologuard test three years ago, but it was not covered by insurance at the time.  No chronic upper digestive symptoms  ROS:  Review of Systems  Constitutional:  Negative for appetite change and unexpected weight change.  HENT:  Negative for mouth sores and voice change.   Eyes:  Negative for pain and redness.  Respiratory:  Negative for cough and shortness of breath.   Cardiovascular:  Negative for chest pain and palpitations.  Genitourinary:  Negative for dysuria and hematuria.  Musculoskeletal:  Negative for arthralgias and myalgias.  Skin:  Negative for pallor and rash.  Neurological:  Negative for weakness and headaches.  Hematological:  Negative for adenopathy.  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   Anxiety 1998   Spoke with ob/gyn. Prescription for Xanax  for short period.   Arthritis 2005   Chiropractor mentioned inflammation throughout my spine   Basal cell carcinoma 07/22/2022   Left sup clavicle - excised   Basal cell carcinoma 09/09/2022   Right lat chest - excised   Basal cell carcinoma 09/30/2022   Right medial chest - Mohs (Dr Leigh)   Basal cell carcinoma 07/31/2022   Left shoulder -  needs Mohs   Basal cell carcinoma 09/13/2023   Left frontal scalp - needs Mohs   Basal cell carcinoma 09/13/2023   Dorsum nose - Imiquimod  cream   Cancer (HCC)    Skin/Basil cell   Depression 1987   Depressed in high school   High cholesterol    Myocardial infarction [facility])    Preventative health care 04/29/2021   Systolic CHF, acute [facility])      Past Surgical History: Past Surgical History:   Procedure Laterality Date   CERVICAL SPINE SURGERY     CORONARY STENT INTERVENTION N/A 01/20/2021   Procedure: CORONARY STENT INTERVENTION;  Surgeon: Court Dorn PARAS, MD;  Location: [facility] INVASIVE CV LAB;  Service: Cardiovascular;  Laterality: N/A;   LEFT HEART CATH AND CORONARY ANGIOGRAPHY N/A 01/20/2021   Procedure: LEFT HEART CATH AND CORONARY ANGIOGRAPHY;  Surgeon: Court Dorn PARAS, MD;  Location: [facility] INVASIVE CV LAB;  Service: Cardiovascular;  Laterality: N/A;   SPINE SURGERY  11/06/2010   Spinal fusion C5-C7     Family History: Family History  Problem Relation Age of Onset   Thyroid  disease Mother    Heart disease Father    Heart attack Father 46   Cancer Maternal Grandmother 79       uterine cancer    Social History: Social History   Socioeconomic History   Marital status: Single    Spouse name: Not on file   Number of children: 0   Years of education: Not on file   Highest education level: Associate degree: occupational, Scientist, product/process development, or vocational program  Occupational History   Occupation: bartender/waitress    Comment: part time  Tobacco Use   Smoking status: Former    Current packs/day: 0.00    Average packs/day: 0.5 packs/day for 3.0 years (1.5 ttl pk-yrs)    Types: Cigarettes    Quit date: 01/21/2016    Years since quitting: 7.7    Passive exposure: Past   Smokeless tobacco: Never   Tobacco comments:    Started at age 13 and quit at age 69. 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   Social Drivers of Health   Financial Resource Strain: Medium Risk (06/23/2023)   Overall Financial Resource Strain (CARDIA)    Difficulty of Paying Living Expenses: Somewhat hard  Food Insecurity: No Food Insecurity (06/23/2023)   Hunger Vital Sign    Worried About Running Out of Food in the Last Year: Never true    Ran Out of Food in the Last Year: Never true  Transportation Needs: No Transportation Needs (06/23/2023)   PRAPARE - Administrator, Civil Service (Medical): No    Lack of Transportation (Non-Medical): No  Physical Activity: Insufficiently Active (06/23/2023)   Exercise Vital Sign    Days of Exercise per Week: 1  day    Minutes of Exercise per Session: 30 min  Stress: Stress Concern Present (06/23/2023)   [employer] of Occupational Health - Occupational Stress Questionnaire    Feeling of Stress : Rather much  Social Connections: Moderately Isolated (06/23/2023)   Social Connection and Isolation Panel    Frequency of Communication with Friends and Family: More than three times a week    Frequency of Social Gatherings with Friends and Family: Once a week    Attends Religious Services: More than 4 times per year    Active Member of [employer] or Organizations: No    Attends Engineer, structural: Not on file    Marital Status: Never married    Allergies: Allergies  Allergen Reactions   Atorvastatin  Diarrhea   Lactose Intolerance (Gi) Other (See Comments)    GI upset, SOB   Soy Allergy (Obsolete) Other (See Comments)    GI upset   Sulfa Antibiotics Nausea And Vomiting   Sulfites     Specifically mentioned as an allergy to an  ingredient in inhalers.   Dorethia Bienenstock ]     Joint stiffness and swelling   Molds & Smuts Rash    Outpatient Meds: Current Outpatient Medications  Medication Sig Dispense Refill   acetaminophen  (TYLENOL ) 500 MG tablet Take 500 mg by mouth every 6 (six) hours as  needed for moderate pain or mild pain.     ALPRAZolam  (XANAX ) 0.25 MG tablet Take 1 tablet (0.25 mg total) by mouth at bedtime as needed for anxiety. 30 tablet 0   aspirin  81 MG chewable tablet Chew 81 mg by mouth daily.     DULoxetine  (CYMBALTA ) 20 MG capsule Take 2 capsules (40 mg total) by mouth daily. 180 capsule 1   Evolocumab  (REPATHA  SURECLICK) 140 MG/ML SOAJ Inject 140 mg into the skin every 14 (fourteen) days. 2 mL 11   furosemide  (LASIX ) 20 MG tablet Take 1 tablet (20 mg total) by mouth daily as needed. For weight gain 3lb overnight, 5lbs in a week, shortness of breath or leg edema 30 tablet 1   imiquimod  (ALDARA ) 5 % cream Apply topically as directed. Apply to nose daily x 6 weeks. Apply to right forehead and left temple twice daily x 2 weeks. 24 each 1   nitroGLYCERIN  (NITROSTAT ) 0.4 MG SL tablet Place 1 tablet (0.4 mg total) under the tongue every 5 (five) minutes x 3 doses as needed for chest pain. 25 tablet 2   rosuvastatin  (CRESTOR ) 40 MG tablet Take 1 tablet (40 mg total) by mouth daily. 90 tablet 3   No current facility-administered medications for this visit.      ___________________________________________________________________ Objective   Exam:  BP 108/64 (BP Location: Left Arm, Patient Position: Sitting, Cuff Size: Normal)   Pulse 66   Ht 5' 4 (1.626 m)   Wt 149 lb 4 oz (67.7 kg)   BMI 25.62 kg/m  Wt Readings from Last 3 Encounters:  10/19/23 149 lb 4 oz (67.7 kg)  10/05/23 149 lb 4.8 oz (67.7 kg)  06/23/23 159 lb 3.2 oz (72.2 kg)    General: Well-appearing Eyes: sclera anicteric, no redness ENT: oral mucosa moist without lesions, no cervical or supraclavicular lymphadenopathy CV: Regular without appreciable murmur, no JVD, no peripheral edema Resp: clear to auscultation bilaterally, normal RR and effort noted GI: soft, no tenderness, with active bowel sounds. No guarding or palpable organomegaly noted. Skin; warm and dry, no rash or jaundice  noted Neuro: awake, alert and oriented x  3. Normal gross motor function and fluent speech   Labs:     Latest Ref Rng & Units 06/23/2023    3:20 PM 07/14/2022    9:12 AM 04/29/2021    9:17 AM  CBC  WBC 3.4 - 10.8 x10E3/uL 6.8  5.3  5.6   Hemoglobin 11.1 - 15.9 g/dL 86.2  84.7  87.4   Hematocrit 34.0 - 46.6 % 41.7  46.6  37.7   Platelets 150 - 450 x10E3/uL 206  243  208.0       Latest Ref Rng & Units 06/23/2023    3:20 PM 07/14/2022    9:12 AM 04/29/2021    9:17 AM  CMP  Glucose 70 - 99 mg/dL 88  895  97   BUN 6 - 24 mg/dL 11  8  9    Creatinine 0.57 - 1.00 mg/dL 9.21  9.19  9.24   Sodium 134 - 144 mmol/L 142  141  141   Potassium 3.5 - 5.2 mmol/L 3.9  5.2  4.3   Chloride 96 - 106 mmol/L 103  102  103   CO2 20 - 29 mmol/L 24  23  30    Calcium  8.7 - 10.2 mg/dL 9.5  89.4  9.1   Total Protein 6.0 - 8.5 g/dL 7.2  7.6  6.6   Total Bilirubin 0.0 - 1.2 mg/dL 0.5  0.4  0.3   Alkaline Phos 44 - 121 IU/L 81  93  88   AST 0 - 40 IU/L 20  23  16    ALT 0 - 32 IU/L 22  17  13     TSH normal May 2025   No diagnosis found.  Assessment and Plan Assessment & Plan Constipation Chronic constipation with dry stools improved with dietary changes. Symptoms predate Cymbalta .  She feels that Repatha  may contribute.  Adequate fluid intake reported. - Trial low-dose Miralax, start with a quarter capful every couple of days, adjust as needed.  Screening for colorectal cancer No prior colorectal cancer screening. Family history of maternal colon removal and grandfather's stomach surgery. Discussed importance of screening and preference for colonoscopy over Cologuard due to accuracy and lower false positives and negatives. Explained colonoscopy risks and benefits. - Schedule colonoscopy in endoscopy lab.   The benefits and risks of the planned procedure(s) were described in detail with the patient or (when appropriate) their health care proxy.  Risks were outlined as including, but not limited to,  bleeding, infection, perforation, adverse medication reaction leading to cardiac or pulmonary decompensation, pancreatitis (if ERCP).  The limitation of incomplete mucosal visualization was also discussed.  No guarantees or warranties were given.  Cardiac condition stable    Thank you for the courtesy of this consult.  Please call me with any questions or concerns.  Victory LITTIE Brand III  CC: Referring provider noted above

## 2023-10-24 ENCOUNTER — Other Ambulatory Visit (HOSPITAL_BASED_OUTPATIENT_CLINIC_OR_DEPARTMENT_OTHER): Payer: Self-pay

## 2023-10-25 ENCOUNTER — Other Ambulatory Visit (HOSPITAL_BASED_OUTPATIENT_CLINIC_OR_DEPARTMENT_OTHER): Payer: Self-pay

## 2023-10-25 ENCOUNTER — Other Ambulatory Visit: Payer: Self-pay

## 2023-11-02 ENCOUNTER — Encounter: Payer: Self-pay | Admitting: Dermatology

## 2023-11-03 ENCOUNTER — Other Ambulatory Visit (HOSPITAL_BASED_OUTPATIENT_CLINIC_OR_DEPARTMENT_OTHER): Payer: Self-pay

## 2023-11-03 ENCOUNTER — Other Ambulatory Visit (HOSPITAL_COMMUNITY)
Admission: RE | Admit: 2023-11-03 | Discharge: 2023-11-03 | Disposition: A | Discharge status: Home / Self Care | Source: Ambulatory Visit | Attending: Nurse Practitioner | Admitting: Nurse Practitioner

## 2023-11-03 ENCOUNTER — Encounter: Payer: Self-pay | Admitting: Nurse Practitioner

## 2023-11-03 ENCOUNTER — Ambulatory Visit (INDEPENDENT_AMBULATORY_CARE_PROVIDER_SITE_OTHER): Payer: Self-pay | Admitting: Nurse Practitioner

## 2023-11-03 VITALS — BP 104/64 | HR 61 | Ht 63.75 in | Wt 148.0 lb

## 2023-11-03 DIAGNOSIS — Z780 Asymptomatic menopausal state: Secondary | ICD-10-CM

## 2023-11-03 DIAGNOSIS — Z1331 Encounter for screening for depression: Secondary | ICD-10-CM

## 2023-11-03 DIAGNOSIS — Z01419 Encounter for gynecological examination (general) (routine) without abnormal findings: Secondary | ICD-10-CM | POA: Insufficient documentation

## 2023-11-03 DIAGNOSIS — Z124 Encounter for screening for malignant neoplasm of cervix: Secondary | ICD-10-CM

## 2023-11-03 DIAGNOSIS — N760 Acute vaginitis: Secondary | ICD-10-CM | POA: Diagnosis not present

## 2023-11-03 DIAGNOSIS — N898 Other specified noninflammatory disorders of vagina: Secondary | ICD-10-CM

## 2023-11-03 LAB — WET PREP FOR TRICH, YEAST, CLUE

## 2023-11-03 MED ORDER — METRONIDAZOLE 500 MG PO TABS
500.0000 mg | ORAL_TABLET | Freq: Two times a day (BID) | ORAL | 0 refills | Status: DC
  Filled 2023-11-03: qty 14, 7d supply, fill #0

## 2023-11-03 NOTE — Progress Notes (Signed)
 Wanda Wood [birth year] [id number]   History:  53 y.o. G3P0030 presents as new patient to establish care. Complains of dark yellow discharge for a few months that comes and goes. No odor or itching. Not sexually active. Postmenopausal - no HRT. H/O CO2 laser in her 55s, HPV + a couple of years ago. H/O MI, HLD, migraines, anxiety, CHF.   Gynecologic History No LMP recorded. Patient is postmenopausal.   Contraception/Family planning: post menopausal status Sexually active: No  Health Maintenance Last Pap: 2024. Results were: Normal per patient Last mammogram: 2012. Results were:  Last colonoscopy: Never Last Dexa: Not indicated     10/05/2023    2:17 PM  Depression screen PHQ 2/9  Decreased Interest 0  Down, Depressed, Hopeless 0  PHQ - 2 Score 0  Altered sleeping 0  Tired, decreased energy 0  Change in appetite 0  Feeling bad or failure about yourself  0  Trouble concentrating 0  Moving slowly or fidgety/restless 0  Suicidal thoughts 0  PHQ-9 Score 0  Difficult doing work/chores Not difficult at all     Past medical history, past surgical history, family history and social history were all reviewed and documented in the EPIC chart. Single. 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   ROS:  A ROS was performed and pertinent positives and negatives are included.  Exam:  Vitals:   11/03/23 0854  BP: 104/64  Pulse: 61  SpO2: 97%  Weight: 148 lb (67.1 kg)  Height: 5' 3.75 (1.619 m)   Body mass index is 25.6 kg/m.  General appearance:  Normal Thyroid :  Symmetrical, normal in size, without palpable masses or nodularity. Respiratory  Auscultation:  Clear without wheezing or rhonchi Cardiovascular  Auscultation:  Regular rate, without rubs, murmurs or gallops  Edema/varicosities:  Not grossly evident Abdominal  Soft,nontender, without masses, guarding or rebound.  Liver/spleen:  No organomegaly noted  Hernia:  None appreciated  Skin  Inspection:  Grossly normal Breasts:  Examined lying and sitting.   Right: Without masses, retractions, nipple discharge or axillary adenopathy.   Left: Without masses, retractions, nipple discharge or axillary adenopathy. Pelvic: External genitalia:  no lesions              Urethra:  normal appearing urethra with no masses, tenderness or lesions              Bartholins and Skenes: normal                 Vagina: Scant yellow discharge present, no erythema. Atrophic changes              Cervix: no lesions Bimanual Exam:  Uterus:  no masses or tenderness              Adnexa: no mass, fullness, tenderness              Rectovaginal: Deferred              Anus:  normal, no lesions  Dereck Keas, CMA present as chaperone.   Wet prep + clue cells (+ odor)  Assessment/Plan:  53 y.o. G3P0030 to establish care.   Well female exam with routine gynecological exam - Plan: Cytology - PAP( [place]). Education provided on SBEs, importance of preventative screenings, current guidelines, high calcium  diet, regular exercise, and multivitamin daily. Labs with PCP.   Postmenopausal - no HRT, no bleeding  Vaginal discharge - Plan: WET PREP FOR TRICH, YEAST, CLUE. + BV.   Bacterial vaginosis -  Plan: metroNIDAZOLE  (FLAGYL ) 500 MG tablet BID x 7 days.   Cervical cancer screening - Plan: Cytology - PAP( [place]). CO2 laser in her 55s, reports remote remote HPV + paps, normal in 2024. Pap today per guidelines.   Screening for breast cancer - Last mammogram in 2012. Declines future screening. Recommend SBEs. Discussed current guidelines and importance of preventative screenings. Normal breast exam today.  Screening for colon cancer - Has not had screening. Scheduled for colonoscopy next month.   Screening for osteoporosis - Average risk. Will plan DXA at age 67.   Return in about 1 year (around 11/02/2024) for Annual.    Wanda DELENA Shutter DNP, 9:30 AM 11/03/2023

## 2023-11-04 LAB — CYTOLOGY - PAP
Comment: NEGATIVE
Diagnosis: NEGATIVE
High risk HPV: NEGATIVE

## 2023-11-08 ENCOUNTER — Ambulatory Visit: Payer: Self-pay | Admitting: Nurse Practitioner

## 2023-11-08 ENCOUNTER — Encounter: Payer: Self-pay | Admitting: Dermatology

## 2023-11-08 ENCOUNTER — Ambulatory Visit: Admitting: Dermatology

## 2023-11-08 VITALS — Temp 97.7°F

## 2023-11-08 DIAGNOSIS — C4491 Basal cell carcinoma of skin, unspecified: Secondary | ICD-10-CM

## 2023-11-08 DIAGNOSIS — L814 Other melanin hyperpigmentation: Secondary | ICD-10-CM

## 2023-11-08 DIAGNOSIS — L570 Actinic keratosis: Secondary | ICD-10-CM | POA: Diagnosis not present

## 2023-11-08 DIAGNOSIS — C44619 Basal cell carcinoma of skin of left upper limb, including shoulder: Secondary | ICD-10-CM

## 2023-11-08 DIAGNOSIS — L578 Other skin changes due to chronic exposure to nonionizing radiation: Secondary | ICD-10-CM | POA: Diagnosis not present

## 2023-11-08 NOTE — Patient Instructions (Signed)

## 2023-11-08 NOTE — Progress Notes (Signed)
 Follow-Up Visit   Subjective  Wanda Wood is a 53 y.o. female who presents for the following: Mohs for a nodular Basal Cell Carcinoma on the left superior clavicle, biopsied on 07/23/2023 by Dr. Jesusa Potters. The patient states that this lesion is right next to were she received her last vaccine and has been present for about 20 years.   The following portions of the chart were reviewed this encounter and updated as appropriate: medications, allergies, medical history  Review of Systems:  No other skin or systemic complaints except as noted in HPI or Assessment and Plan.  Objective  Well appearing patient in no apparent distress; mood and affect are within normal limits.  A focused examination was performed of the following areas: Left superior clavicle Relevant physical exam findings are noted in the Assessment and Plan.   Left Shoulder Pink nodular plaque   Assessment & Plan   Nodular and Infiltrative Basal Cell Carcinoma Exam: left frontal scalp  Treatment Plan: Mohs scheduled 11/22/2023  BASAL CELL CARCINOMA (BCC), UNSPECIFIED SITE Left Shoulder Mohs surgery  Consent obtained: written  Anticoagulation: Is the patient taking prescription anticoagulant and/or aspirin  prescribed/recommended by a physician? No   Was the anticoagulation regimen changed prior to Mohs? No    Anesthesia: Anesthesia method: local infiltration Local anesthetic: lidocaine  1% WITH epi  Procedure Details: Timeout: pre-procedure verification complete Procedure Prep: patient was prepped and draped in usual sterile fashion Prep type: chlorhexidine Biopsy accession number: [id number] Biopsy lab: Cutaneous Pathology Date of biopsy: 07/23/2023 Frozen section biopsy performed: No   Specimen debulked: Yes   Pre-Op diagnosis: basal cell carcinoma BCC subtype: nodular MohsAIQ Surgical site (if tumor spans multiple areas, please select predominant area): upper extremity Surgery side:  left Surgical site (from skin exam): Left Shoulder Pre-operative length (cm): 2 Pre-operative width (cm): 1.7 Indications for Mohs surgery: anatomic location where tissue conservation is critical and tumor size greater than 2 cm Previously treated? No    Micrographic Surgery Details: Post-operative length (cm): 3.3 Post-operative width (cm): 3.5 Number of Mohs stages: 1 Post surgery depth of defect: subcutaneous fat  Stage 1    Tumor features identified on Mohs section: no tumor identified    Depth of tumor invasion after stage: subcutaneous fat  Patient tolerance of procedure: tolerated well, no immediate complications  Reconstruction: Was the defect reconstructed? Yes   Was reconstruction performed by the same Mohs surgeon? Yes   Setting of reconstruction: outpatient office When was reconstruction performed? same day Type of reconstruction: linear Linear reconstruction: complex Length of linear repair (cm): 9  Opioids: Did the patient receive a prescription for opioid/narcotic related to Mohs surgery?: No    Antibiotics: Does patient meet AHA guidelines for endocarditis?: No   Does patient meet AHA guidelines for orthopedic prophylaxis?: No   Were antibiotics given on the day of surgery?: No    Skin repair Complexity:  Complex Final length (cm):  9 Informed consent: discussed and consent obtained   Timeout: patient name, date of birth, surgical site, and procedure verified   Procedure prep:  Patient was prepped and draped in usual sterile fashion Prep type:  Chlorhexidine Anesthesia: the lesion was anesthetized in a standard fashion   Anesthetic:  1% lidocaine  w/ epinephrine 1-100,000 buffered w/ 8.4% NaHCO3 Reason for type of repair: reduce the risk of dehiscence, infection, and necrosis, preserve normal anatomy, preserve normal anatomical and functional relationships and avoid adjacent structures   Undermining: area extensively undermined   Subcutaneous layers (deep  stitches):  Suture size:  3-0 Suture type: PDS (polydioxanone)   Stitches:  Buried horizontal mattress Fine/surface layer approximation (top stitches):  Suture type: cyanoacrylate tissue glue   Hemostasis achieved with: suture, pressure and electrodesiccation Outcome: patient tolerated procedure well with no complications   Post-procedure details: sterile dressing applied and wound care instructions given   Dressing type: pressure dressing (Steri Strips)    ACTINIC KERATOSIS    ACTINIC KERATOSIS Exam: Erythematous thin papules/macules with gritty scale at the face  Actinic keratoses are precancerous spots that appear secondary to cumulative UV radiation exposure/sun exposure over time. They are chronic with expected duration over 1 year. A portion of actinic keratoses will progress to squamous cell carcinoma of the skin. It is not possible to reliably predict which spots will progress to skin cancer and so treatment is recommended to prevent development of skin cancer.  Recommend daily broad spectrum sunscreen SPF 30+ to sun-exposed areas, reapply every 2 hours as needed.  Recommend staying in the shade or wearing long sleeves, sun glasses (UVA+UVB protection) and wide brim hats (4-inch brim around the entire circumference of the hat). Call for new or changing lesions.  Treatment Plan: Ok to finish imiquimod , per Wanda Wood.  Reviewed course of treatment and expected reaction.  Patient advised to expect inflammation and crusting and advised that erosions are possible.  Patient advised to be diligent with sun protection during and after treatment. Handout with details of how to apply medication and what to expect provided. Counseled to keep medication out of reach of children and pets.  Reviewed course of treatment and expected reaction.  Patient advised to expect inflammation and crusting and advised that erosions are possible.  Patient advised to be diligent with sun protection during and  after treatment. Handout with details of how to apply medication and what to expect provided. Counseled to keep medication out of reach of children and pets.  Superficial Basal Cell Carcinoma- nose - Bx proven - Currently using imiquimod   Return in about 3 weeks (around 11/29/2023) for Wound Check.  LILLETTE Rollene Gobble, RN, am acting as scribe for RUFUS CHRISTELLA HOLY, MD .   11/08/2023  HISTORY OF PRESENT ILLNESS  Wanda Wood is seen in consultation at the request of Dr. Leigh for biopsy-proven Nodular Basal Cell Carcinoma on the left upper arm/shoulder. They note that the area has been present for about 10 years increasing in size with time.  There is no history of previous treatment.  Reports no other new or changing lesions and has no other complaints today.  Medications and allergies: see patient chart.  Review of systems: Reviewed 8 systems and notable for the above skin cancer.  All other systems reviewed are unremarkable/negative, unless noted in the HPI. Past medical history, surgical history, family history, social history were also reviewed and are noted in the chart/questionnaire.    PHYSICAL EXAMINATION  General: Well-appearing, in no acute distress, alert and oriented x 4. Vitals reviewed in chart (if available).   Skin: Exam reveals a 2.0 x 1.7 cm erythematous papule and biopsy scar on the left upper arm/shoulder. There are rhytids, telangiectasias, and lentigines, consistent with photodamage.  Biopsy report(s) reviewed, confirming the diagnosis.   ASSESSMENT  1) Nodular Basal Cell Carcinoma on the left shoulder/upper arm 2) photodamage 3) solar lentigines   PLAN   1. Due to location, size, histology, or recurrence and the likelihood of subclinical extension as well as the need to conserve normal surrounding tissue, the patient was deemed  acceptable for Mohs micrographic surgery (MMS).  The nature and purpose of the procedure, associated benefits and risks including  recurrence and scarring, possible complications such as pain, infection, and bleeding, and alternative methods of treatment if appropriate were discussed with the patient during consent. The lesion location was verified by the patient, by reviewing previous notes, pathology reports, and by photographs as well as angulation measurements if available.  Informed consent was reviewed and signed by the patient, and timeout was performed at 9:30 AM. See op note below.  2. For the photodamage and solar lentigines, sun protection discussed/information given on OTC sunscreens, and we recommend continued regular follow-up with primary dermatologist every 6 months or sooner for any growing, bleeding, or changing lesions. 3. Prognosis and future surveillance discussed. 4. Letter with treatment outcome sent to referring provider. 5. Pain acetaminophen /ibuprofen  MOHS MICROGRAPHIC SURGERY AND RECONSTRUCTION  Initial size:   2.0 x 1.7 cm Surgical defect/wound size: 3.3 x 3.5 cm Anesthesia:    0.33% lidocaine  with 1:200,000 epinephrine EBL:    <5 mL Complications:  None Repair type:   Complex SQ suture:   3-0 PDS Cutaneous suture:  Cyanoacrylate and Steristrips Final size of the repair: 9.0 cm  Stages: 1  STAGE I: Anesthesia achieved with 0.5% lidocaine  with 1:200,000 epinephrine. ChloraPrep applied. 2 section(s) excised using Mohs technique (this includes total peripheral and deep tissue margin excision and evaluation with frozen sections, excised and interpreted by the same physician). The tumor was first debulked and then excised with an approx. 2mm margin.  Hemostasis was achieved with electrocautery as needed.  The specimen was then oriented, subdivided/relaxed, inked, and processed using Mohs technique.    Frozen section analysis revealed a clear deep and peripheral margin.  Reconstruction  The surgical wound was then cleaned, prepped, and re-anesthetized as above. Wound edges were undermined  extensively along at least one entire edge and at a distance equal to or greater than the width of the defect (see wound defect size above) in order to achieve closure and decrease wound tension and anatomic distortion. Redundant tissue repair including standing cone removal was performed. Hemostasis was achieved with electrocautery. Subcutaneous and epidermal tissues were approximated with the above sutures. The surgical site was then lightly scrubbed with sterile, saline-soaked gauze. Steri-strips were applied, and the area was then bandaged using Vaseline ointment, non-adherent gauze, gauze pads, and tape to provide an adequate pressure dressing. The patient tolerated the procedure well, was given detailed written and verbal wound care instructions, and was discharged in good condition.   The patient will follow-up: 4 weeks.   Documentation: I have reviewed the above documentation for accuracy and completeness, and I agree with the above.  RUFUS CHRISTELLA HOLY, MD

## 2023-11-11 ENCOUNTER — Encounter: Payer: Self-pay | Admitting: Dermatology

## 2023-11-16 ENCOUNTER — Encounter: Payer: Self-pay | Admitting: Dermatology

## 2023-11-17 ENCOUNTER — Telehealth: Payer: Self-pay

## 2023-11-17 NOTE — Telephone Encounter (Signed)
 Patient called to cancel her mohs surgery for the Nodular and Infiltrative BCC on the left frontal scalp. Patient does not want to reschedule and will not be treating it at a different office. I advised the patient about the risks of leaving a basal cell untreated. The patient wanted to proceed with cancelling her appointment.

## 2023-11-17 NOTE — Telephone Encounter (Signed)
 Patient called to cancel her Mohs surgery on the

## 2023-11-22 ENCOUNTER — Encounter: Admitting: Dermatology

## 2023-11-29 ENCOUNTER — Encounter: Payer: Self-pay | Admitting: Dermatology

## 2023-11-29 ENCOUNTER — Ambulatory Visit: Admitting: Dermatology

## 2023-11-29 VITALS — BP 114/76 | HR 65

## 2023-11-29 DIAGNOSIS — Z85828 Personal history of other malignant neoplasm of skin: Secondary | ICD-10-CM | POA: Diagnosis not present

## 2023-11-29 DIAGNOSIS — C4491 Basal cell carcinoma of skin, unspecified: Secondary | ICD-10-CM

## 2023-11-29 DIAGNOSIS — L905 Scar conditions and fibrosis of skin: Secondary | ICD-10-CM | POA: Diagnosis not present

## 2023-11-29 NOTE — Patient Instructions (Addendum)

## 2023-11-29 NOTE — Progress Notes (Signed)
   Follow Up Visit   Subjective  Jonika Critz is a 53 y.o. female who presents for the following: follow up from Mohs surgery   The patient presents for follow up from Mohs surgery for a BCC on the left shoulder, treated on 11/08/23, repaired with linear closure. The patient has been bandaging the wound as directed. The endorse the following concerns: none  The following portions of the chart were reviewed this encounter and updated as appropriate: medications, allergies, medical history  Review of Systems:  No other skin or systemic complaints except as noted in HPI or Assessment and Plan.  Objective  Well appearing patient in no apparent distress; mood and affect are within normal limits.  A focal examination was performed including scalp, head, face and left shoulder. All findings within normal limits unless otherwise noted below.  Healing wound with mild erythema  Relevant physical exam findings are noted in the Assessment and Plan.    Assessment & Plan   Scar s/p Mohs for [facility], treated on 11/08/23 on the left shoulder, repaired with linear closure - Reassured that wound is healing well - No evidence of infection - No swelling, induration, purulence, dehiscence, or tenderness out of proportion to the clinical exam, see photo above - Discussed that scars take up to 12 months to mature from the date of surgery - Recommend SPF 30+ to scar daily to prevent purple color from UV exposure during scar maturation process - Discussed that erythema and raised appearance of scar will fade over the next 4-6 months - OK to start scar massage at 4-6 weeks post-op - Can consider silicone based products for scar healing starting at 6 weeks post-op  HISTORY OF BASAL CELL CARCINOMA OF THE SKIN - No evidence of recurrence today - Recommend regular full body skin exams - Recommend daily broad spectrum sunscreen SPF 30+ to sun-exposed areas, reapply every 2 hours as needed.  - Call if any new or  changing lesions are noted between office visits  Return if symptoms worsen or fail to improve.  I, Darice Smock, CMA, am acting as scribe for RUFUS CHRISTELLA HOLY, MD.   Documentation: I have reviewed the above documentation for accuracy and completeness, and I agree with the above.  RUFUS CHRISTELLA HOLY, MD

## 2023-12-02 ENCOUNTER — Telehealth: Payer: Self-pay | Admitting: Gastroenterology

## 2023-12-02 ENCOUNTER — Other Ambulatory Visit: Payer: Self-pay

## 2023-12-02 DIAGNOSIS — Z1211 Encounter for screening for malignant neoplasm of colon: Secondary | ICD-10-CM

## 2023-12-02 MED ORDER — NA SULFATE-K SULFATE-MG SULF 17.5-3.13-1.6 GM/177ML PO SOLN
1.0000 | Freq: Once | ORAL | 0 refills | Status: AC
Start: 2023-12-02 — End: 2023-12-02

## 2023-12-02 NOTE — Telephone Encounter (Signed)
 Inbound call from pharmacy stating patients insurance will not cover suprep and patient is scheduled for procedure on 10/29. Would like to know if theres an alternative. Please advise  Thank you

## 2023-12-02 NOTE — Telephone Encounter (Signed)
 Spoke with [facility] and they state the suprep will cost $52.04. When I spoke with the patient earlier she requested it be sent to [facility] as she already found out it would be cheaper than her other pharmacy. Pharmacy reports [facility] doesn't lower the price.

## 2023-12-02 NOTE — Telephone Encounter (Signed)
 Inbound call from patient stating she has not yet received prep medication for 10/29 colonoscopy. Please advise, thank you

## 2023-12-02 NOTE — Telephone Encounter (Signed)
 Rx sent.

## 2023-12-08 ENCOUNTER — Ambulatory Visit: Admitting: Gastroenterology

## 2023-12-08 ENCOUNTER — Encounter: Payer: Self-pay | Admitting: Gastroenterology

## 2023-12-08 VITALS — BP 100/59 | HR 55 | Temp 97.2°F | Resp 17 | Ht 63.75 in | Wt 148.0 lb

## 2023-12-08 DIAGNOSIS — D122 Benign neoplasm of ascending colon: Secondary | ICD-10-CM | POA: Diagnosis not present

## 2023-12-08 DIAGNOSIS — Z1211 Encounter for screening for malignant neoplasm of colon: Secondary | ICD-10-CM | POA: Diagnosis present

## 2023-12-08 MED ORDER — SODIUM CHLORIDE 0.9 % IV SOLN: 500.0000 mL | INTRAVENOUS | Status: DC

## 2023-12-08 NOTE — Progress Notes (Signed)
 Laketia Vicknair CRNA relieves [employer] after report

## 2023-12-08 NOTE — Progress Notes (Signed)
 History and Physical:  This patient presents for endoscopic testing for: Encounter Diagnosis  Name Primary?   Special screening for malignant neoplasms, colon Yes    53 year old woman here today for her first screening colonoscopy. She was last seen in the office 10/19/2023 to discuss this exam and also chronic constipation. Patient is otherwise without complaints or active issues today.   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   Anxiety 1998   Spoke with ob/gyn. Prescription for Xanax  for short period.   Arthritis 2005   Chiropractor mentioned inflammation throughout my spine   Basal cell carcinoma 07/22/2022   Left sup clavicle - excised   Basal cell carcinoma 09/09/2022   Right lat chest - excised   Basal cell carcinoma 09/30/2022   Right medial chest - Mohs (Dr Leigh)   Basal cell carcinoma 07/31/2022   Left shoulder - tx mohs Dr. Corey 11/08/2023   Basal cell carcinoma 09/13/2023   Left frontal scalp - needs Mohs- UNTREATED CERTIFIED LETTER SENT 11/2023   Basal cell carcinoma 09/13/2023   Dorsum nose - Imiquimod  cream   Cancer (HCC)    Skin/Basil cell   Depression 1987   Depressed in high school   High cholesterol    Migraines    Myocardial infarction [facility])    Preventative health care 04/29/2021   STD (sexually transmitted disease)    HPV, herpes   Systolic CHF, acute (HCC)      Past Surgical History: Past Surgical History:  Procedure Laterality Date   CERVICAL SPINE SURGERY     CERVIX LESION DESTRUCTION     had laser done yrs ago for abnormal pap smear   CORONARY STENT INTERVENTION N/A 01/20/2021   Procedure: CORONARY STENT INTERVENTION;  Surgeon: Court Dorn PARAS, MD;  Location: [facility] INVASIVE CV [facility];  Service: Cardiovascular;  Laterality: N/A;   LEFT HEART CATH AND CORONARY ANGIOGRAPHY N/A 01/20/2021   Procedure: LEFT HEART CATH AND CORONARY  ANGIOGRAPHY;  Surgeon: Court Dorn PARAS, MD;  Location: [facility] INVAS[facility] CV LAB;  Service: Cardiovascular;  Laterality: N/A;   SPINE SURGERY  11/06/2010   Spinal fusion C5-C7    Allergies: Allergies  Allergen Reactions   Lactose Intolerance (Gi) Other (See Comments)    GI upset, SOB   Atorvastatin  Diarrhea   Soy Allergy (Obsolete) Other (See Comments)    GI upset   Sulfa Antibiotics Nausea And Vomiting   Sulfites Other (See Comments)    Specifically mentioned as an allergy to an  ingredient in inhalers.   Dorethia Harvest ]     Joint stiffness and swelling   Molds & Smuts Rash    Outpatient Meds: Current Outpatient Medications  Medication Sig Dispense Refill   acetaminophen  (TYLENOL ) 500 MG tablet Take 500 mg by mouth every 6 (six) hours as needed for moderate pain or mild pain.     ALPRAZolam  (XANAX ) 0.25 MG tablet Take 1 tablet (0.25 mg total) by mouth at bedtime as needed for anxiety. 30 tablet 0   DULoxetine  (CYMBALTA ) 20 MG capsule Take 2 capsules (40 mg total) by mouth daily. 180 capsule 1   rosuvastatin  (CRESTOR ) 40 MG tablet Take 1 tablet (40 mg total) by mouth daily. 90 tablet 3   Evolocumab  (REPATHA  SURECLICK) 140 MG/ML SOAJ Inject 140 mg into the skin every 14 (fourteen) days. 2 mL 11   furosemide  (LASIX ) 20 MG tablet  Take 1 tablet (20 mg total) by mouth daily as needed. For weight gain 3lb overnight, 5lbs in a week, shortness of breath or leg edema 30 tablet 1   nitroGLYCERIN  (NITROSTAT ) 0.4 MG SL tablet Place 1 tablet (0.4 mg total) under the tongue every 5 (five) minutes x 3 doses as needed for chest pain. 25 tablet 2   Current Facility-Administered Medications  Medication Dose Route Frequency Provider Last Rate Last Admin   0.9 %  sodium chloride  infusion  500 mL Intravenous Continuous Danis, Victory CROME III, MD          ___________________________________________________________________ Objective   Exam:  BP 113/71   Pulse 63   Temp (!) 97.2 F (36.2 C)   Ht 5'  3.75 (1.619 m)   Wt 148 lb (67.1 kg)   SpO2 98%   BMI 25.60 kg/m   CV: regular , S1/S2 Resp: clear to auscultation bilaterally, normal RR and effort noted GI: soft, no tenderness, with active bowel sounds.   Assessment: Encounter Diagnosis  Name Primary?   Special screening for malignant neoplasms, colon Yes     Plan: Colonoscopy   The benefits and risks of the planned procedure(s) were described in detail with the patient or (when appropriate) their health care proxy.  Risks were outlined as including, but not limited to, bleeding, infection, perforation, adverse medication reaction leading to cardiac or pulmonary decompensation, pancreatitis (if ERCP).  The limitation of incomplete mucosal visualization was also discussed.  No guarantees or warranties were given.  The patient is appropriate for an endoscopic procedure in the ambulatory setting.   - Victory Brand, MD

## 2023-12-08 NOTE — Progress Notes (Signed)
 Sedate, gd SR, tolerated procedure well, VSS, report to RN

## 2023-12-08 NOTE — Progress Notes (Signed)
 Pt's states no medical or surgical changes since previsit or office visit.

## 2023-12-08 NOTE — Progress Notes (Signed)
 Called to room to assist during endoscopic procedure.  Patient ID and intended procedure confirmed with present staff. Received instructions for my participation in the procedure from the performing physician.

## 2023-12-08 NOTE — Patient Instructions (Signed)

## 2023-12-08 NOTE — Op Note (Signed)
 [facility] Patient Name: Wanda Wood Procedure Date: 12/08/2023 12:17 PM MRN: [id number] Endoscopist: Victory L. Legrand , MD, [id number] Age: 52 Referring MD:  Date of Birth: [birth year] Gender: Female Account #: [id number] Procedure:                Colonoscopy Indications:              Screening for colorectal malignant neoplasm, This                            is the patient's first colonoscopy Medicines:                Monitored Anesthesia Care Procedure:                Pre-Anesthesia Assessment:                           - Prior to the procedure, a History and Physical                            was performed, and patient medications and                            allergies were reviewed. The patient's tolerance of                            previous anesthesia was also reviewed. The risks                            and benefits of the procedure and the sedation                            options and risks were discussed with the patient.                            All questions were answered, and informed consent                            was obtained. Prior Anticoagulants: The patient has                            taken no anticoagulant or antiplatelet agents. ASA                            Grade Assessment: II - A patient with mild systemic                            disease. After reviewing the risks and benefits,                            the patient was deemed in satisfactory condition to                            undergo the procedure.  After obtaining informed consent, the colonoscope                            was passed under direct vision. Throughout the                            procedure, the patient's blood pressure, pulse, and                            oxygen saturations were monitored continuously. The                            Olympus CF-HQ190L ([id number]) Colonoscope was                            introduced through the  anus and advanced to the the                            cecum, identified by appendiceal orifice and                            ileocecal valve. The colonoscopy was performed                            without difficulty. The patient tolerated the                            procedure well. The quality of the bowel                            preparation was excellent. The ileocecal valve,                            appendiceal orifice, and rectum were photographed.                            The bowel preparation used was SUPREP. Scope In: 12:22:30 PM Scope Out: 12:34:46 PM Scope Withdrawal Time: 0 hours 9 minutes 9 seconds  Total Procedure Duration: 0 hours 12 minutes 16 seconds  Findings:                 The perianal and digital rectal examinations were                            normal.                           Repeat examination of right colon under NBI                            performed.                           A diminutive polyp was found in the ascending  colon. The polyp was semi-sessile. The polyp was                            removed with a cold snare. Resection and retrieval                            were complete.                           The exam was otherwise without abnormality on                            direct and retroflexion views. Complications:            No immediate complications. Estimated Blood Loss:     Estimated blood loss was minimal. Impression:               - One diminutive polyp in the ascending colon,                            removed with a cold snare. Resected and retrieved.                           - The examination was otherwise normal on direct                            and retroflexion views. Recommendation:           - Patient has a contact number available for                            emergencies. The signs and symptoms of potential                            delayed complications were discussed with the                             patient. Return to normal activities tomorrow.                            Written discharge instructions were provided to the                            patient.                           - Resume previous diet.                           - Continue present medications.                           - Await pathology results.                           - Repeat colonoscopy is recommended for  surveillance. The colonoscopy date will be                            determined after pathology results from today's                            exam become available for review. Orva Gwaltney L. Legrand, MD 12/08/2023 12:38:59 PM This report has been signed electronically.

## 2023-12-09 NOTE — Telephone Encounter (Signed)
No answer on follow up call. 

## 2023-12-10 LAB — SURGICAL PATHOLOGY

## 2023-12-12 ENCOUNTER — Ambulatory Visit: Payer: Self-pay | Admitting: Gastroenterology

## 2023-12-13 ENCOUNTER — Ambulatory Visit: Admitting: Physician Assistant

## 2023-12-27 ENCOUNTER — Other Ambulatory Visit (HOSPITAL_BASED_OUTPATIENT_CLINIC_OR_DEPARTMENT_OTHER): Payer: Self-pay

## 2023-12-27 ENCOUNTER — Encounter (HOSPITAL_BASED_OUTPATIENT_CLINIC_OR_DEPARTMENT_OTHER): Payer: Self-pay | Admitting: Family Medicine

## 2023-12-27 ENCOUNTER — Ambulatory Visit (HOSPITAL_BASED_OUTPATIENT_CLINIC_OR_DEPARTMENT_OTHER): Admitting: Family Medicine

## 2023-12-27 VITALS — BP 116/77 | HR 57 | Temp 97.5°F | Resp 16 | Wt 151.0 lb

## 2023-12-27 DIAGNOSIS — F411 Generalized anxiety disorder: Secondary | ICD-10-CM | POA: Diagnosis not present

## 2023-12-27 MED ORDER — DULOXETINE HCL 20 MG PO CPEP
40.0000 mg | ORAL_CAPSULE | Freq: Every day | ORAL | 1 refills | Status: DC
  Filled 2023-12-27 – 2024-01-17 (×2): qty 180, 90d supply, fill #0

## 2023-12-27 NOTE — Assessment & Plan Note (Signed)
 At last visit we did increase dose of duloxetine  slightly.  She reports that she is doing very well with current dose.  Denies any concerns related to medication change, no side effects reported.  She also continues with alprazolam , however finds that she is using it less frequently at present. We discussed considerations and she would like to continue with medication at current dose which I feel is reasonable.  Refill sent to pharmacy on file. We can plan to follow-up in 4 to 6 months to monitor progress or sooner as needed.

## 2023-12-27 NOTE — Progress Notes (Signed)
    Procedures performed today:    None.  Independent interpretation of notes and tests performed by another provider:   None.  Brief History, Exam, Impression, and Recommendations:    BP 116/77 (Cuff Size: Normal)   Pulse (!) 57   Temp (!) 97.5 F (36.4 C) (Oral)   Resp 16   Wt 151 lb (68.5 kg)   SpO2 99%   BMI 26.12 kg/m   GAD (generalized anxiety disorder) Assessment & Plan: At last visit we did increase dose of duloxetine  slightly.  She reports that she is doing very well with current dose.  Denies any concerns related to medication change, no side effects reported.  She also continues with alprazolam , however finds that she is using it less frequently at present. We discussed considerations and she would like to continue with medication at current dose which I feel is reasonable.  Refill sent to pharmacy on file. We can plan to follow-up in 4 to 6 months to monitor progress or sooner as needed.   Other orders -     DULoxetine  HCl; Take 2 capsules (40 mg total) by mouth daily.  Dispense: 180 capsule; Refill: 1  Return in about 4 months (around 04/25/2024) for med check.   ___________________________________________ Wanda Wallander de [place], MD, ABFM, [facility] [place]

## 2023-12-28 ENCOUNTER — Ambulatory Visit: Admitting: Physician Assistant

## 2024-01-10 ENCOUNTER — Other Ambulatory Visit (HOSPITAL_BASED_OUTPATIENT_CLINIC_OR_DEPARTMENT_OTHER): Payer: Self-pay

## 2024-01-17 ENCOUNTER — Other Ambulatory Visit: Payer: Self-pay

## 2024-01-17 ENCOUNTER — Other Ambulatory Visit: Payer: Self-pay | Admitting: Family Medicine

## 2024-01-17 ENCOUNTER — Other Ambulatory Visit (HOSPITAL_BASED_OUTPATIENT_CLINIC_OR_DEPARTMENT_OTHER): Payer: Self-pay

## 2024-01-17 DIAGNOSIS — F411 Generalized anxiety disorder: Secondary | ICD-10-CM

## 2024-01-18 ENCOUNTER — Other Ambulatory Visit (HOSPITAL_BASED_OUTPATIENT_CLINIC_OR_DEPARTMENT_OTHER): Payer: Self-pay

## 2024-01-18 ENCOUNTER — Encounter (HOSPITAL_BASED_OUTPATIENT_CLINIC_OR_DEPARTMENT_OTHER): Payer: Self-pay | Admitting: Family Medicine

## 2024-01-18 MED ORDER — ALPRAZOLAM 0.25 MG PO TABS
0.2500 mg | ORAL_TABLET | Freq: Every evening | ORAL | 0 refills | Status: DC | PRN
  Filled 2024-01-18: qty 30, 30d supply, fill #0

## 2024-01-20 ENCOUNTER — Other Ambulatory Visit (HOSPITAL_BASED_OUTPATIENT_CLINIC_OR_DEPARTMENT_OTHER): Payer: Self-pay

## 2024-02-08 ENCOUNTER — Other Ambulatory Visit (HOSPITAL_BASED_OUTPATIENT_CLINIC_OR_DEPARTMENT_OTHER): Payer: Self-pay

## 2024-02-08 MED ORDER — DULOXETINE HCL 30 MG PO CPEP
30.0000 mg | ORAL_CAPSULE | Freq: Every day | ORAL | 2 refills | Status: AC
  Filled 2024-02-08: qty 90, 90d supply, fill #0

## 2024-02-08 NOTE — Addendum Note (Signed)
 Addended by: DE CUBA, Regis Hinton J on: 02/08/2024 08:12 AM   Modules accepted: Orders

## 2024-02-11 ENCOUNTER — Other Ambulatory Visit (HOSPITAL_BASED_OUTPATIENT_CLINIC_OR_DEPARTMENT_OTHER): Payer: Self-pay

## 2024-02-17 ENCOUNTER — Encounter: Payer: Self-pay | Admitting: Cardiovascular Disease

## 2024-02-17 ENCOUNTER — Other Ambulatory Visit (HOSPITAL_BASED_OUTPATIENT_CLINIC_OR_DEPARTMENT_OTHER): Payer: Self-pay

## 2024-02-17 NOTE — Telephone Encounter (Signed)
 Spoke with patient. 1st available appointment made with Dr. Barbaraann

## 2024-02-21 ENCOUNTER — Telehealth: Admitting: Family Medicine

## 2024-02-21 ENCOUNTER — Encounter: Payer: Self-pay | Admitting: Family Medicine

## 2024-02-21 ENCOUNTER — Other Ambulatory Visit (HOSPITAL_BASED_OUTPATIENT_CLINIC_OR_DEPARTMENT_OTHER): Payer: Self-pay

## 2024-02-21 ENCOUNTER — Ambulatory Visit: Payer: Self-pay

## 2024-02-21 VITALS — Temp 99.6°F | Ht 63.75 in | Wt 150.0 lb

## 2024-02-21 DIAGNOSIS — J329 Chronic sinusitis, unspecified: Secondary | ICD-10-CM | POA: Diagnosis not present

## 2024-02-21 DIAGNOSIS — B9689 Other specified bacterial agents as the cause of diseases classified elsewhere: Secondary | ICD-10-CM

## 2024-02-21 DIAGNOSIS — I2510 Atherosclerotic heart disease of native coronary artery without angina pectoris: Secondary | ICD-10-CM | POA: Diagnosis not present

## 2024-02-21 DIAGNOSIS — Z9861 Coronary angioplasty status: Secondary | ICD-10-CM

## 2024-02-21 MED ORDER — AMOXICILLIN-POT CLAVULANATE 875-125 MG PO TABS
1.0000 | ORAL_TABLET | Freq: Two times a day (BID) | ORAL | 0 refills | Status: AC
  Filled 2024-02-21: qty 20, 10d supply, fill #0

## 2024-02-21 NOTE — Patient Instructions (Signed)
 Concern for sinusitis with day 9 and no improvement at all and worsening sore throat. Also possibility of strep throat. Augmentin  would cover both and doing 10 days of this- if not improving within 2 days of antibiotics or if worsens please schedule in persona evaluation for further workup

## 2024-02-21 NOTE — Progress Notes (Signed)
 " Phone [phone] Virtual visit via Video note   Subjective:  Chief complaint: Chief Complaint  Patient presents with   Cough    Last Sunday she started having a severe sore throat, and coughing, last night she started coughing so hard she threw up; has been running 100 degree fevers at night; has been taking zpack sicne last Monday she had one refill and she has refilled it because her sore throat started coming back last night; productive cough with yellow/brown mucus;     Our team/I connected with Wanda Wood at  1:00 PM EST by a video enabled telemedicine application (caregility through epic) and verified that I am speaking with the correct person using two identifiers. Our team/I discussed the limitations of evaluation and management by telemedicine and the availability of in person appointments.No physical exam was performed (except for noted visual exam or audio findings with Telehealth visits).   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     ALPRAZolam  (XANAX ) 0.25 MG tablet Take 1 tablet (0.25  mg total) by mouth at bedtime as needed for anxiety. 30 tablet 0   amoxicillin -clavulanate (AUGMENTIN ) 875-125 MG tablet Take 1 tablet by mouth 2 (two) times daily for 10 days. 20 tablet 0   DULoxetine  (CYMBALTA ) 30 MG capsule Take 1 capsule (30 mg total) by mouth daily. 30 capsule 2   Evolocumab  (REPATHA  SURECLICK) 140 MG/ML SOAJ Inject 140 mg into the skin every 14 (fourteen) days. 2 mL 11   furosemide  (LASIX ) 20 MG tablet Take 1 tablet (20 mg total) by mouth daily as needed. For weight gain 3lb overnight, 5lbs in a week, shortness of breath or leg edema 30 tablet 1   nitroGLYCERIN  (NITROSTAT ) 0.4 MG SL tablet Place 1 tablet (0.4 mg total) under the tongue every 5 (five) minutes x 3 doses as needed for chest pain. 25 tablet 2   rosuvastatin  (CRESTOR ) 40 MG tablet Take 1 tablet (40 mg total) by mouth daily. 90 tablet 3   Na Sulfate-K Sulfate-Mg Sulfate concentrate (SUPREP) 17.5-3.13-1.6 GM/177ML SOLN as directed. (Patient not taking: Reported on 02/21/2024)     No current facility-administered medications for this visit.     Objective:  Temp 99.6 F (37.6 C) (Oral)   Ht 5' 3.75 (1.619 m)   Wt 150 lb (68 kg)   BMI 25.95 kg/m  self reported vitals Gen: Appears fatigued Lungs: nonlabored, normal respiratory rate  Skin: appears dry, no obvious rash     Assessment and Plan   # Cough/congestion S: Patient symptoms started on Sunday, January 4.  Complains of severe sore throat and coughing fits and congestion.  Posttussive emesis noted last night. Some  sinus pain and pressure- mainly yellow discharge. Cough is more green/brown discharge -Temperature up to about 100 degrees at night at beginning measured and feels subjective warmth still at night suspected elevated temperature -She started azithromycin on January 5 through an old prescription - feels mild benefit - tonsils red and swollen. Uvula at times swollen.   -has taken mucinex nighttime liquid which helped but got too dry- hydrated  but possibly not enough.  - has not tested for COVID or flu -sore throat worsening again. Sinus pressure stable.   - no chest pain or shortness of breath even with CAD history A/P: Concern for sinusitis with day 9 and no improvement at all and worsening sore throat. Also possibility of strep throat. Augmentin  would cover both and doing 10 days of this- if not improving within 2 days of antibiotics or if worsens please schedule in persona evaluation for further workup  # CAD-no CAD noted.  No chest pain or shortness of breath.  Compliant with Repatha .  Does not tolerate aspirin  due to swelling-continue current medication  Recommended follow up: Recommended 48-hour follow-up if not improving for in person evaluation Future Appointments  Date Time Provider Department Center  04/27/2024  3:00 PM O'Neal, Darryle Ned, MD [facility]  05/01/2024  3:30 PM de Cuba, Raymond J, MD [facility] [id number]  05/03/2024  3:45 PM Sandridge, Erminio POUR, PA-C [facility] None    Lab/Order associations:   ICD-10-CM   1. Bacterial sinusitis  J32.9    B96.89       Meds ordered this encounter  Medications   amoxicillin -clavulanate (AUGMENTIN ) 875-125 MG tablet    Sig: Take 1 tablet by mouth 2 (two) times daily for 10 days.    Dispense:  20 tablet    Refill:  0   Return precautions advised.  Garnette Lukes, MD  "

## 2024-02-21 NOTE — Telephone Encounter (Signed)
" °  FYI Only or Action Required?: FYI only for provider: appointment scheduled on 1/12.  Patient was last seen in primary care on 12/27/2023 by de Cuba, Quintin PARAS, MD.  Called Nurse Triage reporting URI.  Symptoms began a week ago.  Interventions attempted: Prescription medications: took a course of azithromycin she had at home with no improvement.  Symptoms are: gradually worsening.  Triage Disposition: See Physician Within 24 Hours  Patient/caregiver understands and will follow disposition?: Yes  Copied from CRM #[id number]. Topic: Clinical - Red Word Triage >> Feb 21, 2024 10:26 AM Tiffini S wrote: Red Word that prompted transfer to Nurse Triage: Symptoms for a viral/ bacterial infections-  mucus from the chest with a lot of fluid, yesterday coughing so hard that she vomited, sore throat- feels like a 6 of  out 10 Reason for Disposition  [1] Continuous (nonstop) coughing interferes with work or school AND [2] no improvement using cough treatment per Care Advice  Answer Assessment - Initial Assessment Questions 1. ONSET: When did the cough begin?      Last Sunday 02/13/24  2. SEVERITY: How bad is the cough today?      I coughed so hard last night I threw up  3. SPUTUM: Describe the color of your sputum (e.g., none, dry cough; clear, white, yellow, green)     Yellow, white froth  4. HEMOPTYSIS: Are you coughing up any blood? If Yes, ask: How much? (e.g., flecks, streaks, tablespoons, etc.)     Denies  5. DIFFICULTY BREATHING: Are you having difficulty breathing? If Yes, ask: How bad is it? (e.g., mild, moderate, severe)      Denies  6. FEVER: Do you have a fever? If Yes, ask: What is your temperature, how was it measured, and when did it start?     Denies at this time  7. CARDIAC HISTORY: Do you have any history of heart disease? (e.g., heart attack, congestive heart failure)      CAD, NSTEMI  8. LUNG HISTORY: Do you have any history of lung disease?   (e.g., pulmonary embolus, asthma, emphysema)     Denies  10. OTHER SYMPTOMS: Do you have any other symptoms? (e.g., runny nose, wheezing, chest pain)       Little bit of a sore throat this morning  Had a azithromycin refill and took an entire 5 day course, finished Friday.  Protocols used: Cough - Acute Productive-A-AH  "

## 2024-03-13 ENCOUNTER — Ambulatory Visit: Admitting: Physician Assistant

## 2024-03-15 ENCOUNTER — Other Ambulatory Visit (HOSPITAL_COMMUNITY): Payer: Self-pay

## 2024-03-15 ENCOUNTER — Other Ambulatory Visit (HOSPITAL_BASED_OUTPATIENT_CLINIC_OR_DEPARTMENT_OTHER): Payer: Self-pay

## 2024-03-15 ENCOUNTER — Telehealth (HOSPITAL_BASED_OUTPATIENT_CLINIC_OR_DEPARTMENT_OTHER): Payer: Self-pay

## 2024-03-15 ENCOUNTER — Other Ambulatory Visit: Payer: Self-pay | Admitting: Family Medicine

## 2024-03-15 DIAGNOSIS — F411 Generalized anxiety disorder: Secondary | ICD-10-CM

## 2024-03-15 MED ORDER — ALPRAZOLAM 0.25 MG PO TABS
0.2500 mg | ORAL_TABLET | Freq: Every evening | ORAL | 0 refills | Status: AC | PRN
  Filled 2024-03-15: qty 30, 30d supply, fill #0

## 2024-03-15 NOTE — Telephone Encounter (Signed)
 Pharmacy Patient Advocate Encounter   Received notification from Pt Calls Messages that prior authorization for Repatha  SureClick 140MG /ML auto-injectors  is required/requested.   Insurance verification completed.   The patient is insured through [employer].   Per test claim: PA required; PA submitted to above mentioned insurance via Latent Key/confirmation #/EOC [facility] Status is pending

## 2024-03-16 ENCOUNTER — Other Ambulatory Visit: Payer: Self-pay

## 2024-03-16 ENCOUNTER — Other Ambulatory Visit (HOSPITAL_COMMUNITY): Payer: Self-pay

## 2024-03-16 NOTE — Telephone Encounter (Signed)
 Pharmacy Patient Advocate Encounter  Received notification from [employer] that Prior Authorization for Repatha  SureClick 140MG /ML auto-injectors  has been DENIED.  See denial reason below. No denial letter attached in CMM. Will attach denial letter to Media tab once received.   PA #/Case ID/Reference #: [id number]

## 2024-03-17 NOTE — Telephone Encounter (Signed)
 Patient is on rosuvastatin  40mg  and had a >50% reduction in LDL based on last lipid panel. Can we attempt PA again?

## 2024-04-27 ENCOUNTER — Ambulatory Visit: Admitting: Cardiovascular Disease

## 2024-05-01 ENCOUNTER — Ambulatory Visit (HOSPITAL_BASED_OUTPATIENT_CLINIC_OR_DEPARTMENT_OTHER): Admitting: Family Medicine

## 2024-05-03 ENCOUNTER — Ambulatory Visit: Admitting: Physician Assistant
